# Patient Record
Sex: Male | Born: 1950 | Race: Black or African American | Hispanic: No | Marital: Single | Smoking: Former smoker
Health system: Southern US, Community
[De-identification: ages and names within clinical notes are randomized; demographics above are authoritative.]

## PROBLEM LIST (undated history)

## (undated) ENCOUNTER — Emergency Department (HOSPITAL_COMMUNITY): Admission: EM | Payer: BC Managed Care – PPO | Source: Home / Self Care

## (undated) DIAGNOSIS — F1010 Alcohol abuse, uncomplicated: Secondary | ICD-10-CM

## (undated) DIAGNOSIS — I10 Essential (primary) hypertension: Secondary | ICD-10-CM

## (undated) DIAGNOSIS — E785 Hyperlipidemia, unspecified: Secondary | ICD-10-CM

## (undated) HISTORY — DX: Hyperlipidemia, unspecified: E78.5

## (undated) HISTORY — DX: Alcohol abuse, uncomplicated: F10.10

## (undated) HISTORY — DX: Essential (primary) hypertension: I10

---

## 2002-08-06 ENCOUNTER — Encounter: Payer: Self-pay | Admitting: Emergency Medicine

## 2002-08-06 ENCOUNTER — Emergency Department (HOSPITAL_COMMUNITY): Admission: EM | Admit: 2002-08-06 | Discharge: 2002-08-06 | Payer: Self-pay | Admitting: Emergency Medicine

## 2005-10-21 ENCOUNTER — Ambulatory Visit: Payer: Self-pay | Admitting: Cardiology

## 2005-10-21 ENCOUNTER — Encounter: Payer: Self-pay | Admitting: Emergency Medicine

## 2005-10-21 ENCOUNTER — Inpatient Hospital Stay (HOSPITAL_COMMUNITY): Admission: EM | Admit: 2005-10-21 | Discharge: 2005-10-22 | Payer: Self-pay | Admitting: Emergency Medicine

## 2005-10-21 HISTORY — PX: OTHER SURGICAL HISTORY: SHX169

## 2005-10-27 ENCOUNTER — Ambulatory Visit: Payer: Self-pay | Admitting: Cardiology

## 2005-12-14 ENCOUNTER — Ambulatory Visit: Payer: Self-pay | Admitting: Cardiology

## 2006-07-01 ENCOUNTER — Emergency Department (HOSPITAL_COMMUNITY): Admission: EM | Admit: 2006-07-01 | Discharge: 2006-07-01 | Payer: Self-pay | Admitting: Emergency Medicine

## 2006-07-03 ENCOUNTER — Ambulatory Visit: Payer: Self-pay | Admitting: Cardiovascular Disease

## 2007-11-22 ENCOUNTER — Emergency Department (HOSPITAL_COMMUNITY): Admission: EM | Admit: 2007-11-22 | Discharge: 2007-11-22 | Payer: Self-pay | Admitting: Emergency Medicine

## 2008-01-19 ENCOUNTER — Emergency Department (HOSPITAL_COMMUNITY): Admission: EM | Admit: 2008-01-19 | Discharge: 2008-01-19 | Payer: Self-pay | Admitting: Emergency Medicine

## 2008-09-10 ENCOUNTER — Inpatient Hospital Stay (HOSPITAL_COMMUNITY): Admission: EM | Admit: 2008-09-10 | Discharge: 2008-09-13 | Payer: Self-pay | Admitting: Emergency Medicine

## 2008-09-10 ENCOUNTER — Ambulatory Visit: Payer: Self-pay | Admitting: Orthopedic Surgery

## 2008-11-26 DIAGNOSIS — I10 Essential (primary) hypertension: Secondary | ICD-10-CM | POA: Insufficient documentation

## 2008-11-26 DIAGNOSIS — F1010 Alcohol abuse, uncomplicated: Secondary | ICD-10-CM | POA: Insufficient documentation

## 2008-11-26 DIAGNOSIS — E785 Hyperlipidemia, unspecified: Secondary | ICD-10-CM | POA: Insufficient documentation

## 2008-11-27 ENCOUNTER — Ambulatory Visit: Payer: Self-pay | Admitting: Adult Health

## 2008-11-27 DIAGNOSIS — R42 Dizziness and giddiness: Secondary | ICD-10-CM | POA: Insufficient documentation

## 2009-04-01 ENCOUNTER — Telehealth (INDEPENDENT_AMBULATORY_CARE_PROVIDER_SITE_OTHER): Payer: Self-pay | Admitting: *Deleted

## 2009-06-20 ENCOUNTER — Emergency Department (HOSPITAL_COMMUNITY): Admission: EM | Admit: 2009-06-20 | Discharge: 2009-06-21 | Payer: Self-pay | Admitting: Emergency Medicine

## 2009-09-07 ENCOUNTER — Ambulatory Visit: Payer: Self-pay | Admitting: Cardiology

## 2009-09-07 DIAGNOSIS — I2510 Atherosclerotic heart disease of native coronary artery without angina pectoris: Secondary | ICD-10-CM | POA: Insufficient documentation

## 2009-11-29 ENCOUNTER — Emergency Department (HOSPITAL_COMMUNITY): Admission: EM | Admit: 2009-11-29 | Discharge: 2009-11-29 | Payer: Self-pay | Admitting: Emergency Medicine

## 2010-03-09 ENCOUNTER — Emergency Department (HOSPITAL_COMMUNITY)
Admission: EM | Admit: 2010-03-09 | Discharge: 2010-03-09 | Payer: Self-pay | Source: Home / Self Care | Admitting: Emergency Medicine

## 2010-03-22 NOTE — Assessment & Plan Note (Signed)
Summary: F2Y NEED MEDS CALLED IN/TMJ   Visit Type:  2 YR FU NEEDS MEDS REFILLED   History of Present Illness: 60 y/o AAM with known history of nonobstructive CAD per cardiac cath 2007, Hypertension, Hypercholesterolemia presents today for follow-up.  He has not been seen in the office for 2years.  He is requesting refill on his antihypertension medication.  The patient has been unemployed for over a year and did not follow up secondary to lack of insurance.  He does not have a primary care physician as well.  He has been complaining of dizziness and fatigue over the last month.  He ran out of his Norvasc 3 days ago and has actually been feeling better without it.  He did some work up in [place] over the summer and noticed his BP being elevated into the 180's-190's, but since then it has been better. He takes his BP at home and has noticed that his BP is in the 100-120's systolic.  He denies chest pain, SOB,fluid retention or H/A's.  He only takes a mutivitamin and norasc until he ran out 3 days ago.  Preventive Screening-Counseling & Management  Alcohol-Tobacco     Alcohol drinks/day: 2     Alcohol type: all     Alcohol Counseling: to STOP drinking     Smoking Status: no     Smoking Cessation Counseling: no  Problems Prior to Update: 1)  Alcohol Abuse  (ICD-305.00) 2)  Hyperlipidemia  (ICD-272.4) 3)  Hypertension  (ICD-401.9)  Medications Prior to Update: 1)  Amlodipine Besylate 5 Mg Tabs (Amlodipine Besylate) .... Take 1 Tablet By Mouth Once A Day 2)  Fish Oil 500 Mg Caps (Omega-3 Fatty Acids) .... Take 1 Cap Daily 3)  Daily Multi  Tabs (Multiple Vitamins-Minerals) .... Take 1 Tab Daily 4)  Vicodin 5-500 Mg Tabs (Hydrocodone-Acetaminophen) .... Take As Needed For Pain  Current Medications (verified): 1)  Amlodipine Besylate 5 Mg Tabs (Amlodipine Besylate) .... Hold 2)  Daily Multi  Tabs (Multiple Vitamins-Minerals) .... Take 1 Tab Daily 3)  Vicodin 5-500 Mg Tabs  (Hydrocodone-Acetaminophen) .... Take As Needed For Pain 4)  Aspir-Low 81 Mg Tbec (Aspirin) .... Take 1 Tab Daily  Allergies (verified): No Known Drug Allergies  Past History:  Risk Factors: Alcohol Use: 2 (11/27/2008) Exercise: no (11/26/2008)  Past medical, surgical, family and social histories (including risk factors) reviewed, and no changes noted (except as noted below).  Past Medical History: Reviewed history from 11/26/2008 and no changes required. Current Problems:  ALCOHOL ABUSE (ICD-305.00) HYPERLIPIDEMIA (ICD-272.4) HYPERTENSION (ICD-401.9)  Past Surgical History: Reviewed history from 11/26/2008 and no changes required. 2 caths in 10/21/2005  Family History: Reviewed history and no changes required. Family History of Hypertension:   Social History: Reviewed history from 11/26/2008 and no changes required. Disabled  Tobacco Use - No.  Alcohol Use - yes Regular Exercise - no Drug Use - no Alcohol drinks/day:  2 Smoking Status:  no  Review of Systems       All other systems have been reviewed and are negative unless stated above.   Vital Signs:  Patient profile:   60 year old male Height:      78 inches Weight:      247 pounds BMI:     28.65 Pulse rate:   83 / minute BP sitting:   127 / 70  (right arm)  Vitals Entered By: Dreama Saa, CNA (November 27, 2008 3:37 PM)  Physical Exam  General:  Well  developed, well nourished, in no acute distress. Head:  normocephalic and atraumatic Eyes:  PERRLA/EOM intact; conjunctiva and lids normal. Lungs:  Clear bilaterally to auscultation and percussion. Heart:  Non-displaced PMI, chest non-tender; regular rate and rhythm, S1, S2 without murmurs, rubs or gallops. Carotid upstroke normal, no bruit. Normal abdominal aortic size, no bruits. Femorals normal pulses, no bruits. Pedals normal pulses. No edema, no varicosities. Abdomen:  Bowel sounds positive; abdomen soft and non-tender without masses, organomegaly,  or hernias noted. No hepatosplenomegaly. Msk:  Back normal, normal gait. Muscle strength and tone normal. Pulses:  pulses normal in all 4 extremities Neurologic:  Alert and oriented x 3. Psych:  Normal affect.   Impression & Recommendations:  Problem # 1:  HYPERTENSION (ICD-401.9) I have taken the patient's BP manually in both arms and find it to be 120/74 on the R and 123/68 on the L.  He states he is feeling better without the use of the Norvasc.  At this time he will not need to be on antihypertensive medication.  He is to continue to take BP at home.  If systolic is persistantly greater than 140 he should call us and we will restart his Norvasc at 5 mg.  He is given a Rx to fill for Norvasc 5 mg. but he is to notify us that he has resumed this. His updated medication list for this problem includes:    Amlodipine Besylate 5 Mg Tabs (Amlodipine besylate) ..... Hold    Aspir-low 81 Mg Tbec (Aspirin) .Marland Kitchen... Take 1 tab daily  Problem # 2:  DIZZINESS (ICD-780.4) This may be related to his use of antihypertensive with normal BP.  Since running out of the medication, symptoms have subsided.  Problem # 3:  HYPERLIPIDEMIA (ICD-272.4) The patient has not had his cholesterol checked with known CAD. I have given him samples of crestor 5mg , and he is to follow up with the health dept for labs.  A referral sheet is completed for him so that he may be allowed to be followed at the health department here in [place].  Patient Instructions: 1)  Your physician recommends that you schedule a follow-up appointment in:  6 months 2)  Your physician has recommended you make the following change in your medication:hold amlodipine 3)  You have been referred to 4)  Your physician has requested that you regularly monitor and record your blood pressure readings at home.  Please use the same machine at the same time of day to check your readings and record them to bring to your follow-up visit.

## 2010-03-22 NOTE — Progress Notes (Signed)
Summary: RX REFILL  Phone Note Call from Patient Call back at Home Phone [phone]   Caller: PT Reason for Call: Refill Medication Summary of Call: [facility]OLOD[facility]INE 5MG  #[facility] [place] Initial call taken by: Faythe Ghee,  April 01, 2009 3:09 PM    New/Updated Medications: AMLODIPINE BESYLATE 5 MG TABS (AMLODIPINE BESYLATE) Take 1 tablet by mouth once a day Prescriptions: AMLODIPINE BESYLATE 5 MG TABS (AMLODIPINE BESYLATE) Take 1 tablet by mouth once a day  #30 x 3   Entered by:   Teressa Lower RN   Authorized by:   Kathlen Brunswick, MD, [facility]   Signed by:   Teressa Lower RN on 04/01/2009   Method used:   Electronically to        [facility] (retail)       [address]       [place], [place]  [address]       Ph: [phone]       Fax: [phone]   RxID:   [id number]   Appended Document: RX REFILL    Clinical Lists Changes  Medications: Rx of AMLODIPINE BESYLATE 5 MG TABS (AMLODIPINE BESYLATE) Take 1 tablet by mouth once a day;  #30 x 3;  Signed;  Entered by: Teressa Lower RN;  Authorized by: Kathlen Brunswick, MD, [facility];  Method used: [facility] to [facility] [phone]*, [address], [place], [place], [place]  [address], Ph: [phone] or [phone], Fax: [phone]    Prescriptions: AMLODIPINE BESYLATE 5 MG TABS (AMLODIPINE BESYLATE) Take 1 tablet by mouth once a day  #30 x 3   Entered by:   Teressa Lower RN   Authorized by:   Kathlen Brunswick, MD, [facility]   Signed by:   Teressa Lower RN on 04/13/2009   Method used:   [facility]ronically to        [facility] [phone]* (retail)       [address]       [place], [place]  [address]       Ph: [phone] or [phone]       Fax: [phone]   RxID:   [id number]

## 2010-03-22 NOTE — Assessment & Plan Note (Signed)
Summary: past due for 6 mth f/u per Lynn/tg   History of Present Illness: Nathaniel Kline returns today for the evaluation of problems listed in A and P.  He has atypical sharp pain intermittently under his left breast. It only lasts for seconds. He denies any angina.   He only drinks occasionally...much less than the past. He is due bloodwork.  Current Medications (verified): 1)  Amlodipine Besylate 5 Mg Tabs (Amlodipine Besylate) .... Take 1 Tablet By Mouth Once A Day 2)  Daily Multi  Tabs (Multiple Vitamins-Minerals) .... Take 1 Tab Daily 3)  Vicodin 5-500 Mg Tabs (Hydrocodone-Acetaminophen) .... Take As Needed For Pain 4)  Aspir-Low 81 Mg Tbec (Aspirin) .... Take 1 Tab Daily 5)  Crestor 5 Mg Tabs (Rosuvastatin Calcium) .Marland Kitchen.. 1 Po Daily  Allergies (verified): No Known Drug Allergies  Review of Systems       Negative other than HPI  Vital Signs:  Patient profile:   60 year old male Height:      78 inches Weight:      250 pounds BMI:     28.99 Pulse rate:   78 / minute Resp:     16 per minute BP sitting:   138 / 80  (left arm)  Vitals Entered By: Marrion Coy, CNA (September 07, 2009 2:24 PM)  Physical Exam  General:  Well developed, well nourished, in no acute distress. Head:  normocephalic and atraumatic Eyes:  PERRLA/EOM intact; conjunctiva and lids normal. Neck:  Neck supple, no JVD. No masses, thyromegaly or abnormal cervical nodes. Chest Kutler Vanvranken:  no deformities or breast masses noted Lungs:  Clear bilaterally to auscultation and percussion. Heart:  Non-displaced PMI, chest non-tender; regular rate and rhythm, S1, S2 without murmurs, rubs or gallops. Carotid upstroke normal, no bruit. Normal abdominal aortic size, no bruits. Femorals normal pulses, no bruits. Pedals normal pulses. No edema, no varicosities. Msk:  Back normal, normal gait. Muscle strength and tone normal. Pulses:  pulses normal in all 4 extremities Extremities:  No clubbing or cyanosis. Neurologic:  Alert and  oriented x 3. Skin:  Intact without lesions or rashes. Psych:  Normal affect.   Problems:  Medical Problems Added: 1)  Dx of Cad, Native Vessel  (ICD-414.01)  EKG  Procedure date:  09/07/2009  Findings:      NSR, low voltage  Impression & Recommendations:  Problem # 1:  HYPERTENSION (ICD-401.9) Assessment Improved Better on minimal meds. Decreased alcohol has probably helped. His updated medication list for this problem includes:    Amlodipine Besylate 5 Mg Tabs (Amlodipine besylate) .Marland Kitchen... Take 1 tablet by mouth once a day    Aspir-low 81 Mg Tbec (Aspirin) .Marland Kitchen... Take 1 tab daily  His updated medication list for this problem includes:    Amlodipine Besylate 5 Mg Tabs (Amlodipine besylate) .Marland Kitchen... Take 1 tablet by mouth once a day    Aspir-low 81 Mg Tbec (Aspirin) .Marland Kitchen... Take 1 tab daily  Future Orders: T-Comprehensive Metabolic Panel ([id number]) ... 09/08/2009  Problem # 2:  CAD, NATIVE VESSEL (ICD-414.01) Assessment: Unchanged  His updated medication list for this problem includes:    Amlodipine Besylate 5 Mg Tabs (Amlodipine besylate) .Marland Kitchen... Take 1 tablet by mouth once a day    Aspir-low 81 Mg Tbec (Aspirin) .Marland Kitchen... Take 1 tab daily  His updated medication list for this problem includes:    Amlodipine Besylate 5 Mg Tabs (Amlodipine besylate) .Marland Kitchen... Take 1 tablet by mouth once a day    Aspir-low 81 Mg Tbec (  Aspirin) .Marland Kitchen... Take 1 tab daily  Problem # 3:  ALCOHOL ABUSE (ICD-305.00) Assessment: Improved  Problem # 4:  HYPERLIPIDEMIA (ICD-272.4) Needs fasting lipids and CMP. Crestor samples given because of lack of funds. His updated medication list for this problem includes:    Crestor 5 Mg Tabs (Rosuvastatin calcium) .Marland Kitchen... 1 po daily  His updated medication list for this problem includes:    Crestor 5 Mg Tabs (Rosuvastatin calcium) .Marland Kitchen... 1 po daily  Future Orders: T-Lipid Profile ([id number]) ... 09/08/2009  Patient Instructions: 1)  Your physician recommends that  you schedule a follow-up appointment in: 1 year 2)  Your physician recommends that you return for a FASTING lipid profile: tomorrow 3)  Your physician recommends that you continue on your current medications as directed. Please refer to the Current Medication list given to you today.

## 2010-03-22 NOTE — Letter (Signed)
Summary: Hospital Consult RT elbow  Hospital Consult RT elbow   Imported By: Cammie Sickle 10/23/2008 18:40:26  _____________________________________________________________________  External Attachment:    Type:   Image     Comment:   External Document

## 2010-05-10 LAB — COMPREHENSIVE METABOLIC PANEL
ALT: 43 U/L (ref 0–53)
AST: 55 U/L — ABNORMAL HIGH (ref 0–37)
Albumin: 4.6 g/dL (ref 3.5–5.2)
Alkaline Phosphatase: 53 U/L (ref 39–117)
BUN: 15 mg/dL (ref 6–23)
CO2: 23 mEq/L (ref 19–32)
Calcium: 8.8 mg/dL (ref 8.4–10.5)
Chloride: 106 mEq/L (ref 96–112)
Creatinine, Ser: 1.31 mg/dL (ref 0.4–1.5)
GFR calc Af Amer: >60 mL/min (ref >60)
GFR calc non Af Amer: 56 mL/min — ABNORMAL LOW (ref >60)
Glucose, Bld: 112 mg/dL — ABNORMAL HIGH (ref 70–99)
Potassium: 3.6 mEq/L (ref 3.5–5.1)
Sodium: 140 mEq/L (ref 135–145)
Total Bilirubin: 0.5 mg/dL (ref 0.3–1.2)
Total Protein: 8.1 g/dL (ref 6.0–8.3)

## 2010-05-10 LAB — RAPID URINE DRUG SCREEN, HOSP PERFORMED
Amphetamines: NOT DETECTED
Barbiturates: NOT DETECTED
Benzodiazepines: NOT DETECTED
Cocaine: NOT DETECTED
Opiates: NOT DETECTED
Tetrahydrocannabinol: NOT DETECTED

## 2010-05-10 LAB — DIFFERENTIAL
Basophils Absolute: 0 10*3/uL (ref 0.0–0.1)
Basophils Relative: 1 % (ref 0–1)
Eosinophils Absolute: 0.1 10*3/uL (ref 0.0–0.7)
Eosinophils Relative: 2 % (ref 0–5)
Lymphocytes Relative: 46 % (ref 12–46)
Lymphs Abs: 3 10*3/uL (ref 0.7–4.0)
Monocytes Absolute: 0.5 10*3/uL (ref 0.1–1.0)
Monocytes Relative: 7 % (ref 3–12)
Neutro Abs: 2.8 10*3/uL (ref 1.7–7.7)
Neutrophils Relative %: 44 % (ref 43–77)

## 2010-05-10 LAB — AMMONIA: Ammonia: 20 umol/L (ref 11–35)

## 2010-05-10 LAB — CBC
HCT: 41.9 % (ref 39.0–52.0)
Hemoglobin: 14.5 g/dL (ref 13.0–17.0)
MCHC: 34.7 g/dL (ref 30.0–36.0)
MCV: 97.8 fL (ref 78.0–100.0)
Platelets: 226 10*3/uL (ref 150–400)
RBC: 4.28 MIL/uL (ref 4.22–5.81)
RDW: 13.6 % (ref 11.5–15.5)
WBC: 6.4 10*3/uL (ref 4.0–10.5)

## 2010-05-10 LAB — ETHANOL: Alcohol, Ethyl (B): 369 mg/dL — ABNORMAL HIGH (ref 0–10)

## 2010-05-29 LAB — FOLATE: Folate: 10.9 ng/mL

## 2010-05-29 LAB — DIFFERENTIAL
Basophils Absolute: 0 10*3/uL (ref 0.0–0.1)
Basophils Absolute: 0 10*3/uL (ref 0.0–0.1)
Basophils Absolute: 0.1 10*3/uL (ref 0.0–0.1)
Basophils Absolute: 0.1 10*3/uL (ref 0.0–0.1)
Basophils Relative: 0 % (ref 0–1)
Basophils Relative: 0 % (ref 0–1)
Basophils Relative: 0 % (ref 0–1)
Basophils Relative: 0 % (ref 0–1)
Eosinophils Absolute: 0 10*3/uL (ref 0.0–0.7)
Eosinophils Absolute: 0.1 10*3/uL (ref 0.0–0.7)
Eosinophils Absolute: 0.2 10*3/uL (ref 0.0–0.7)
Eosinophils Absolute: 0.2 10*3/uL (ref 0.0–0.7)
Eosinophils Relative: 0 % (ref 0–5)
Eosinophils Relative: 1 % (ref 0–5)
Eosinophils Relative: 2 % (ref 0–5)
Eosinophils Relative: 2 % (ref 0–5)
Lymphocytes Relative: 10 % — ABNORMAL LOW (ref 12–46)
Lymphocytes Relative: 22 % (ref 12–46)
Lymphocytes Relative: 25 % (ref 12–46)
Lymphocytes Relative: 29 % (ref 12–46)
Lymphs Abs: 2 10*3/uL (ref 0.7–4.0)
Lymphs Abs: 3.2 10*3/uL (ref 0.7–4.0)
Lymphs Abs: 3.4 10*3/uL (ref 0.7–4.0)
Lymphs Abs: 3.5 10*3/uL (ref 0.7–4.0)
Monocytes Absolute: 1 10*3/uL (ref 0.1–1.0)
Monocytes Absolute: 1.1 10*3/uL — ABNORMAL HIGH (ref 0.1–1.0)
Monocytes Absolute: 1.3 10*3/uL — ABNORMAL HIGH (ref 0.1–1.0)
Monocytes Absolute: 1.4 10*3/uL — ABNORMAL HIGH (ref 0.1–1.0)
Monocytes Relative: 10 % (ref 3–12)
Monocytes Relative: 10 % (ref 3–12)
Monocytes Relative: 7 % (ref 3–12)
Monocytes Relative: 7 % (ref 3–12)
Neutro Abs: 11.2 10*3/uL — ABNORMAL HIGH (ref 1.7–7.7)
Neutro Abs: 16.7 10*3/uL — ABNORMAL HIGH (ref 1.7–7.7)
Neutro Abs: 6.5 10*3/uL (ref 1.7–7.7)
Neutro Abs: 8.9 10*3/uL — ABNORMAL HIGH (ref 1.7–7.7)
Neutrophils Relative %: 59 % (ref 43–77)
Neutrophils Relative %: 64 % (ref 43–77)
Neutrophils Relative %: 70 % (ref 43–77)
Neutrophils Relative %: 83 % — ABNORMAL HIGH (ref 43–77)

## 2010-05-29 LAB — COMPREHENSIVE METABOLIC PANEL
ALT: 17 U/L (ref 0–53)
ALT: 25 U/L (ref 0–53)
AST: 19 U/L (ref 0–37)
AST: 26 U/L (ref 0–37)
Albumin: 2.7 g/dL — ABNORMAL LOW (ref 3.5–5.2)
Albumin: 3.5 g/dL (ref 3.5–5.2)
Alkaline Phosphatase: 44 U/L (ref 39–117)
Alkaline Phosphatase: 55 U/L (ref 39–117)
BUN: 14 mg/dL (ref 6–23)
BUN: 15 mg/dL (ref 6–23)
CO2: 27 mEq/L (ref 19–32)
CO2: 28 mEq/L (ref 19–32)
Calcium: 8.9 mg/dL (ref 8.4–10.5)
Calcium: 9.6 mg/dL (ref 8.4–10.5)
Chloride: 101 mEq/L (ref 96–112)
Chloride: 106 mEq/L (ref 96–112)
Creatinine, Ser: 1.2 mg/dL (ref 0.4–1.5)
Creatinine, Ser: 1.22 mg/dL (ref 0.4–1.5)
GFR calc Af Amer: >60 mL/min (ref >60)
GFR calc Af Amer: >60 mL/min (ref >60)
GFR calc non Af Amer: >60 mL/min (ref >60)
GFR calc non Af Amer: >60 mL/min (ref >60)
Glucose, Bld: 110 mg/dL — ABNORMAL HIGH (ref 70–99)
Glucose, Bld: 88 mg/dL (ref 70–99)
Potassium: 4.1 mEq/L (ref 3.5–5.1)
Potassium: 4.3 mEq/L (ref 3.5–5.1)
Sodium: 137 mEq/L (ref 135–145)
Sodium: 139 mEq/L (ref 135–145)
Total Bilirubin: 0.5 mg/dL (ref 0.3–1.2)
Total Bilirubin: 0.5 mg/dL (ref 0.3–1.2)
Total Protein: 6.2 g/dL (ref 6.0–8.3)
Total Protein: 7.4 g/dL (ref 6.0–8.3)

## 2010-05-29 LAB — C-REACTIVE PROTEIN: CRP: 2.6 mg/dL — ABNORMAL HIGH (ref <0.6)

## 2010-05-29 LAB — BASIC METABOLIC PANEL
BUN: 17 mg/dL (ref 6–23)
CO2: 27 mEq/L (ref 19–32)
Calcium: 9 mg/dL (ref 8.4–10.5)
Chloride: 105 mEq/L (ref 96–112)
Creatinine, Ser: 1.15 mg/dL (ref 0.4–1.5)
GFR calc Af Amer: >60 mL/min (ref >60)
GFR calc non Af Amer: >60 mL/min (ref >60)
Glucose, Bld: 102 mg/dL — ABNORMAL HIGH (ref 70–99)
Potassium: 3.6 mEq/L (ref 3.5–5.1)
Sodium: 138 mEq/L (ref 135–145)

## 2010-05-29 LAB — CBC
HCT: 35.9 % — ABNORMAL LOW (ref 39.0–52.0)
HCT: 37.3 % — ABNORMAL LOW (ref 39.0–52.0)
HCT: 37.3 % — ABNORMAL LOW (ref 39.0–52.0)
HCT: 40.9 % (ref 39.0–52.0)
Hemoglobin: 12.3 g/dL — ABNORMAL LOW (ref 13.0–17.0)
Hemoglobin: 12.8 g/dL — ABNORMAL LOW (ref 13.0–17.0)
Hemoglobin: 13 g/dL (ref 13.0–17.0)
Hemoglobin: 14 g/dL (ref 13.0–17.0)
MCHC: 34.2 g/dL (ref 30.0–36.0)
MCHC: 34.2 g/dL (ref 30.0–36.0)
MCHC: 34.3 g/dL (ref 30.0–36.0)
MCHC: 34.8 g/dL (ref 30.0–36.0)
MCV: 100.1 fL — ABNORMAL HIGH (ref 78.0–100.0)
MCV: 100.5 fL — ABNORMAL HIGH (ref 78.0–100.0)
MCV: 100.7 fL — ABNORMAL HIGH (ref 78.0–100.0)
MCV: 99.3 fL (ref 78.0–100.0)
Platelets: 268 10*3/uL (ref 150–400)
Platelets: 333 10*3/uL (ref 150–400)
Platelets: 354 10*3/uL (ref 150–400)
Platelets: 360 10*3/uL (ref 150–400)
RBC: 3.58 MIL/uL — ABNORMAL LOW (ref 4.22–5.81)
RBC: 3.71 MIL/uL — ABNORMAL LOW (ref 4.22–5.81)
RBC: 3.76 MIL/uL — ABNORMAL LOW (ref 4.22–5.81)
RBC: 4.06 MIL/uL — ABNORMAL LOW (ref 4.22–5.81)
RDW: 12.9 % (ref 11.5–15.5)
RDW: 13.1 % (ref 11.5–15.5)
RDW: 13.1 % (ref 11.5–15.5)
RDW: 13.3 % (ref 11.5–15.5)
WBC: 11 10*3/uL — ABNORMAL HIGH (ref 4.0–10.5)
WBC: 13.9 10*3/uL — ABNORMAL HIGH (ref 4.0–10.5)
WBC: 16.1 10*3/uL — ABNORMAL HIGH (ref 4.0–10.5)
WBC: 20.2 10*3/uL — ABNORMAL HIGH (ref 4.0–10.5)

## 2010-05-29 LAB — T4, FREE: Free T4: 1.09 ng/dL (ref 0.80–1.80)

## 2010-05-29 LAB — PROTIME-INR
INR: 1.1 (ref 0.00–1.49)
Prothrombin Time: 14.1 seconds (ref 11.6–15.2)

## 2010-05-29 LAB — SEDIMENTATION RATE: Sed Rate: 61 mm/hr — ABNORMAL HIGH (ref 0–16)

## 2010-05-29 LAB — VITAMIN B12: Vitamin B-12: 301 pg/mL (ref 211–911)

## 2010-05-29 LAB — TSH: TSH: 6.484 u[IU]/mL — ABNORMAL HIGH (ref 0.350–4.500)

## 2010-06-21 ENCOUNTER — Emergency Department (HOSPITAL_COMMUNITY)
Admission: EM | Admit: 2010-06-21 | Discharge: 2010-06-22 | Disposition: A | Discharge status: Home / Self Care | Payer: Self-pay | Attending: Emergency Medicine | Admitting: Emergency Medicine

## 2010-06-21 DIAGNOSIS — I10 Essential (primary) hypertension: Secondary | ICD-10-CM | POA: Insufficient documentation

## 2010-06-21 DIAGNOSIS — IMO0002 Reserved for concepts with insufficient information to code with codable children: Secondary | ICD-10-CM | POA: Insufficient documentation

## 2010-06-21 DIAGNOSIS — X58XXXA Exposure to other specified factors, initial encounter: Secondary | ICD-10-CM | POA: Insufficient documentation

## 2010-06-21 DIAGNOSIS — M76899 Other specified enthesopathies of unspecified lower limb, excluding foot: Secondary | ICD-10-CM | POA: Insufficient documentation

## 2010-06-21 DIAGNOSIS — M25569 Pain in unspecified knee: Secondary | ICD-10-CM | POA: Insufficient documentation

## 2010-06-21 DIAGNOSIS — Y9361 Activity, american tackle football: Secondary | ICD-10-CM | POA: Insufficient documentation

## 2010-06-21 DIAGNOSIS — E7800 Pure hypercholesterolemia, unspecified: Secondary | ICD-10-CM | POA: Insufficient documentation

## 2010-06-21 DIAGNOSIS — Y998 Other external cause status: Secondary | ICD-10-CM | POA: Insufficient documentation

## 2010-06-21 DIAGNOSIS — K7460 Unspecified cirrhosis of liver: Secondary | ICD-10-CM | POA: Insufficient documentation

## 2010-06-22 ENCOUNTER — Emergency Department (HOSPITAL_COMMUNITY): Payer: Self-pay

## 2010-07-05 NOTE — Consult Note (Signed)
NAMEHAKEEN, SHIPES                   ACCOUNT NO.:  [id number]   MEDICAL RECORD NO.:  [id number]          PATIENT TYPE:  INP   LOCATION:  A338                          FACILITY:  [facility]   PHYSICIAN:  Vickki Hearing, M.D.DATE OF BIRTH:  May 22, 1950   DATE OF CONSULTATION:  09/10/2008  DATE OF DISCHARGE:                                 CONSULTATION   CHIEF COMPLAINT:  Pain and swelling in the right elbow.   HISTORY:  A 60 year old male was injured several weeks ago and sustained  a blunt trauma to his right elbow and a second injury several weeks  later and then a laceration over the olecranon, this progressed to  swelling, tenderness, pain, redness which involved in his forearm,  wrist, and upper brachial area.  He presented to the hospital on 22nd  for evaluation.  He was started on IV antibiotics and Orthopedic consult  was obtained to rule out olecranon bursitis versus abscess.   Examination reveals a well-developed, well-nourished male, grooming, and  hygiene intact.  No developmental abnormalities.  Body habitus is  mesomorphic.  Cardiovascular findings normal pulse and perfusion to the  right upper extremity with some edema noted.   Skin showed a small puncture like wound over the olecranon with no  drainage, appears to have sealed over.  There is streaking proximally  and distally, primarily distally without mid forearm level in  approximately above mid brachial level.   Lymph nodes in the axilla and supraclavicular regions were normal.  Trochlear lymph nodes are negative.   The patient was awake, alert, and oriented x3.  Mood and affect were  normal.   Examination of the right elbow showed that there was some swelling from  the midforearm to the mid brachial area, primary areas of tenderness  were around the elbow and olecranon.  His range of motion was extension  20 degrees and flexion 110 degrees with pain throughout the range of  motion, but there is no rigidity or  resistance to flex the elbow.  Neurologic status showed normal sensation, normal reflexes, normal  orientation x3.   LAB RESULTS:  Basic metabolic panel normal.  CBC, 16 white count, 70%  neutrophils, and absolute neutrophils 11.2.   IMPRESSION:  Cellulitis without abscess.  Recommended continue  antibiotics, use heat to help with pain and symptoms.  I will continue  to follow.      Vickki Hearing, M.D.  Electronically Signed     SEH/MEDQ  D:  09/10/2008  T:  09/10/2008  Job:  [id number]

## 2010-07-05 NOTE — Consult Note (Signed)
NAMEHAILE, Kline                   ACCOUNT NO.:  [id number]   MEDICAL RECORD NO.:  [id number]          PATIENT TYPE:  INP   LOCATION:  A338                          FACILITY:  [facility]   PHYSICIAN:  Tilford Pillar, MD      DATE OF BIRTH:  1950/03/31   DATE OF CONSULTATION:  09/10/2008  DATE OF DISCHARGE:  09/13/2008                                 CONSULTATION   REASON FOR CONSULTATION:  Right elbow erythema and swelling.   HISTORY OF PRESENT ILLNESS:  The patient is a 60 year old male who  presented to [facility] with several days of increasing  discomfort and swelling of the right elbow and stated it also started to  increase in pain.  The patient is relatively healthy but apparently had  fallen several weeks ago.  Following that, he had retraumatized the  right elbow and developed a laceration.  Soon after this, he developed  erythema and edema.  He complains of his pain is somewhat better at this  time, although he still has significant erythema.  He denies any  paresthesias in his hand.  He has no weakness in his extremities.  He is  able to proceed through a full range of motion.   PAST MEDICAL HISTORY:  Hypertension, cirrhosis, hypercholesterolemia.   PAST SURGICAL HISTORY:  None.   MEDICATIONS:  Norvasc and aspirin.   ALLERGIES:  No known drug allergies.  Sensitivity to VANCOMYCIN is  suspected due to his reaction during the initial administration of  VANCOMYCIN on this admission.   SOCIAL HISTORY:  No tobacco.  Occasional alcohol.  No recreational drug  use.   PHYSICAL EXAMINATION:  VITAL SIGNS:  Stable with a temperature of 98  degrees, heart rate 80, respirations of 24, blood pressure 124/80, and  he is at 99% O2 saturation on room air.  GENERAL:  The patient is seated upright in his hospital bed.  He is not  in any distress.  He is alert and oriented x3.  HEENT:  Scalp:  No deformities.  Pupils are equal, round, and reactive.  Extraocular movements are  intact.  Oral mucosa is pink.  NECK:  Trachea is midline.  No cervical lymphadenopathy.  PULMONARY:  Unlabored.  He is clear to auscultation.  CARDIOVASCULAR:  Regular rate and rhythm.  ABDOMEN:  Soft, nontender.  EXTREMITIES:  On evaluation of this extremity, he does have some  erythema overlying the right elbow.  This is soft and edematous.  No  focal fluctuance is noted.  No crepitance is appreciated.  No bony  tenderness on palpation of the distal ulna and radius.  He has good  strength in the hand.  No diminished sensation compared to the left.   ASSESSMENT/PLAN:  Cellulitis of the right elbow.  At this time, the  patient has also been evaluated by Dr. Tiburcio Pea of Orthopedics.  There is  no evidence of any acute trauma to the upper extremity.  No fractures  are noted.  I do not  appreciate any fluctuance consistent with an abscess.  At this time,  there  is nothing to surgically drain.  We will continue treatment with  antibiotics and warm compresses to this area.  This was discussed with  the patient.  He understands, and at this time I will continue to follow  with the patient during this hospitalization.      Tilford Pillar, MD  Electronically Signed     BZ/MEDQ  D:  09/17/2008  T:  09/18/2008  Job:  [id number]

## 2010-07-05 NOTE — H&P (Signed)
Nathaniel Kline, Nathaniel Kline                   ACCOUNT NO.:  [id number]   MEDICAL RECORD NO.:  [id number]          Kline TYPE:  INP   LOCATION:  A338                          FACILITY:  [facility]   PHYSICIAN:  Margaretmary Dys, M.D.DATE OF BIRTH:  09/18/1950   DATE OF ADMISSION:  09/09/2008  DATE OF DISCHARGE:  LH                              HISTORY & PHYSICAL   PRIMARY CARE PHYSICIAN:  Nathaniel Kline is unassigned.   ADMISSION DIAGNOSES:  1. Cellulitis of Nathaniel right forearm and elbow.  2. Abscess right elbow.  3. Indeterminate age fracture of Nathaniel right ulnar bone.  4. History of fall about 4 days ago which was an accidental fall.   CHIEF COMPLAINT:  Pain in his right elbow.   HISTORY OF PRESENT ILLNESS:  Nathaniel Kline is a 60 year old male who  presented to Nathaniel emergency room with complaints of pain and swelling in  his right elbow.  Nathaniel Kline reports having fallen accidentally about 4  days ago on his porch.  Nathaniel Kline was apparently taking out some trash and it  was raining and Nathaniel Kline slipped and fell.  Nathaniel Kline said Nathaniel Kline landed with  his elbow and on his forearm.  Nathaniel Kline had some severe swelling in his elbow  but felt that it was going to go away.  Nathaniel Kline said Nathaniel pain has  progressively increased, also Nathaniel swelling has increased to Nathaniel point  where his right elbow is now swollen and was discharging some yellowish  fluid.  Nathaniel Kline has had some fevers and chills but denies any rigors.  Nathaniel Kline had  no nausea or vomiting.  Nathaniel Kline said that Nathaniel Kline has not had any  decreased function or movement of his right arm.  Nathaniel Kline describes a  shooting pain going through his elbow to his forearm.   Nathaniel Kline was evaluated in Nathaniel emergency room and was found to have a  cellulitis of his right elbow.  Nathaniel Kline was also found to have a fracture of  Nathaniel ulnar bone on his x-ray.  Nathaniel age of Nathaniel fracture was indeterminate  as per Radiologist's report.  Nathaniel Kline is now being admitted for  further evaluation and management.  Nathaniel Kline  denies any chest pain.   REVIEW OF SYSTEMS:  Otherwise negative except as mentioned in history of  present illness.   PAST MEDICAL HISTORY:  1. Hypertension.  2. Liver cirrhosis.  3. Hypercholesterolemia.  4. History of alcohol abuse.   MEDICATIONS:  1. Norvasc 5 mg p.o. once a day.  2. Aspirin 81 mg p.o. once a day.   ALLERGIES:  Nathaniel Kline has no known drug allergies but may have been  reacting to vancomycin as Nathaniel Kline was apparently developing a rash  and was having some hives when Nathaniel Kline was being infused with vancomycin.   SOCIAL HISTORY:  Nathaniel Kline is single, has two children.  Nathaniel Kline denies any  smoking.  Nathaniel Kline drinks occasionally.  Denies any illicit drug use.  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   FAMILY HISTORY:  Positive for hypertension, diabetes, coronary artery  disease in family members and actually his family members are well-known  to Nathaniel hospitalist service for multiple admissions in Nathaniel past.   PHYSICAL EXAMINATION:  Nathaniel Kline was conscious and alert, comfortable,  not in acute distress.  Was well oriented in time, place and person.  VITAL SIGNS: His blood pressure was 123/85 with a pulse of 83,  respirations were 20, temperature 97.5 degrees Fahrenheit, oxygen  saturation was 98% on room air.  HEENT EXAM:  Normocephalic, atraumatic.  Oral mucosa was dry.  No  exudates were noted.  NECK:  Supple.  No JVD or lymphadenopathy.  LUNGS:  Clear clinically with good air entry bilaterally.  HEART: S1-S2 regular.  No accessory gallops or rubs.  ABDOMEN:  Soft, nontender.  Bowel sounds positive.  No masses palpable.  EXTREMITIES:  Nathaniel Kline has a swollen right elbow with an area of  punctum seen with no active drainage at this time.  There was  surrounding erythema and induration in that right elbow consistent with  cellulitis.  Nathaniel Kline also had some induration and erythema extending  to Nathaniel mid upper arm and also Nathaniel proximal forearm.  Nathaniel Kline  had  full range of movement in his wrist and also his fingers without any  significant pain or difficulty.  CNS EXAM:  Grossly intact with no focal neurological deficits.   LABORATORY AND DIAGNOSTIC DATA:  White blood count 16.1, hemoglobin of  13, hematocrit 37.3, platelet count was 268.  Sodium was 138, potassium  3.6, chloride of 105, CO2 was 27, glucose 102, BUN 17, creatinine was  1.15, calcium was 9.0.  X-rays of Nathaniel right forearm showed an age  indeterminate fracture of Nathaniel distal ulna.  X-ray of Nathaniel elbow showed  soft tissue swelling with no acute osseous abnormality.   ASSESSMENT:  Nathaniel Kline is a 60 year old male who had an accidental fall  at home about 4 days ago.  Nathaniel Kline landed on his elbow and his  forearm.  Nathaniel Kline now presents with swelling and abscess cellulitis  of his right elbow and x-rays suggest an indeterminate fracture of his  right forearm.   PLAN:  1. Nathaniel Kline will be admitted to Nathaniel medical floor.  2. Will initiate IV antibiotics with Zosyn and request pharmacy to      dose.  3. Will keep right arm elevated.  4. Will request for orthopedic surgical consult with Dr. Romeo Apple to      further evaluate his right forearm.  Nathaniel Kline may require CT      scan or MRI to really confirm if Nathaniel Kline does have a fracture or not.  5. Will also request Dr. Leticia Penna, surgeon, to evaluate his right elbow      for potential incision and drainage of Nathaniel abscess.  6. Will resume his other home medications.   I have indicated Nathaniel above to Nathaniel Kline Nathaniel possibility of incision  and drainage, Nathaniel possibility of additional workup by orthopedic surgery  to evaluate his arm.  Nathaniel Kline verbalized full understanding.  Nathaniel Kline  denies any possibility of substance abuse during his fall.   Code status.  Nathaniel Kline is full code.  Thank you.  Cultures have been  sent for his right elbow swelling that it is draining.  There is concern  that this may MRSA infection and Nathaniel Kline  may require parenteral  vancomycin.   However, Nathaniel Kline apparently may have reacted to it.  I am not sure  what  Nathaniel rate of infusion was.  If this does come back as MRSA and Nathaniel Kline  has extensive infection, Nathaniel Kline may require Zyvox therapy both  intravenously and possibly subsequently orally as an outpatient.      Margaretmary Dys, M.D.  Electronically Signed     AM/MEDQ  D:  09/10/2008  T:  09/10/2008  Job:  [id number]

## 2010-07-05 NOTE — Discharge Summary (Signed)
NAMECHEY, CHO                   ACCOUNT NO.:  [id number]   MEDICAL RECORD NO.:  [id number]          PATIENT TYPE:  INP   LOCATION:  A338                          FACILITY:  [facility]   PHYSICIAN:  Renee Ramus, MD       DATE OF BIRTH:  November 15, 1950   DATE OF ADMISSION:  09/09/2008  DATE OF DISCHARGE:  LH                               DISCHARGE SUMMARY   PRIMARY DISCHARGE DIAGNOSIS:  Uncomplicated cellulitis.   SECONDARY DIAGNOSES:  1. Alcohol abuse.  2. Hypertension.  3. History of hyperlipidemia.   HOSPITAL COURSE BY PROBLEM:  1. Cellulitis.  The patient is a 60 year old male who was admitted      secondary to increasing pain and swelling in his right elbow.  The      patient was seen in the emergency department.  He was admitted to      our service.  The patient was placed on broad-spectrum antibiotics.      He has had a good clinical response, the swelling is decreased, the      redness and erythema has decreased.  The patient will be placed on      ciprofloxacin at discharge for approximately 14 days.  He is to      follow up with the general surgeon who investigated his arm for      possible abscess if needed.  2. Hypertension.  This has been relatively well controlled on Norvasc,      and he will continue this post discharge.  3. Hypercholesterolemia.  The patient will continue his prehospital      regimen.  4. Alcohol abuse.  The patient does not have signs and symptoms of      active cirrhosis.  He does have macrocytosis with MCV of      approximately 101.  He has been counseled in respect to alcohol      use, but he has a normal INR, he does not have elevated liver      enzymes and does not have thrombocytopenia.  The patient has no      outward appearances of cirrhosis.  He has no evidence of edema or      other electrolyte abnormalities consistent with that.  He has no      evidence of anasarca.  The patient says that he will greatly      decrease the amount that he  drinks.   LABORATORIES OF NOTE:  1. Initial leukocytosis with white count of 20.2 decreasing to 11.0.  2. Macrocytosis with MCV of 100.7.  3. ESR of 61.  4. Mild hypoalbuminemia with an albumin of 2.7.   STUDIES:  1. Plain film of the right forearm showing age indeterminate fracture      of the distal ulna.  He did not have clinical correlation to      suggest this was acute.  2. Right elbow film showing no acute osseous abnormality but just      evidence of soft tissue swelling.  3. Plain film of the right humerus showing no proximal  humeral injury.   MEDICATIONS ON DISCHARGE:  1. Norvasc 5 mg p.o. daily.  2. Aspirin which we are asking him to discontinue.  3. Fish oil 500 mg p.o. daily.  4. Multivitamin for men p.o. daily.  5. Ciprofloxacin 500 mg p.o. b.i.d. x14 days.  6. Vicodin 5/325 one to two p.o. q.4h. p.r.n. pain.   There are no labs or studies pending at the time of discharge.   The patient is in stable condition and anxious for discharge.   Time spent 35 minutes.      Renee Ramus, MD  Electronically Signed     JF/MEDQ  D:  09/13/2008  T:  09/13/2008  Job:  [id number]

## 2010-07-05 NOTE — Group Therapy Note (Signed)
Nathaniel Kline, Nathaniel Kline                   ACCOUNT NO.:  [id number]   MEDICAL RECORD NO.:  [id number]          PATIENT TYPE:  INP   LOCATION:  A338                          FACILITY:  [facility]   PHYSICIAN:  Melissa L. Ladona Ridgel, MD  DATE OF BIRTH:  [birth year]   DATE OF PROCEDURE:  09/11/2008  DATE OF DISCHARGE:                                 PROGRESS NOTE   Please note that the patient has remained afebrile.  He denies any  significant pain in the arm today.  He states that he has increased  range of motion.  He denies nausea or vomiting.  He is eating well.  He  did speak with Dr. Romeo Apple earlier today who recommends continuing  antibiotic therapy for now.   PHYSICAL EXAMINATION:  VITAL SIGNS:  T-max was 97.5 with blood pressure  123/85, pulse 83, respirations 20 and saturation 98%.  GENERAL:  This is a moderately obese African American male in no acute  distress.  EXTREMITIES:  Review of the elbow reveals tenderness at the olecranon  bursa with swelling extending into the biceps, triceps and radioulnar  area but improved since yesterday.  He is able to bend the arm.  I do  not appreciate any purulent material.  PERRL, EOMI, anicteric, nose without lesions or discharge. Mouth with no  lesions. Neck is supple  Chest: Clear to ausculatation, no RRRW  CVS: RRR +S1,S2 no S3 or S4  Abd : mild obsese , non tender, non distended.   The patient was seen by Dr. Romeo Apple earlier today.  He feels at this  time that treating the pain and continuing antibiotics will be  necessary.  No excision is required at this time.  He makes no comment  with regard to the x-ray finding which basically said there was no  fracture of the elbow.  However, the ulnar area had a transverse  impaction fracture, age indeterminate.   ASSESSMENT AND PLAN:  This is a 60 year old gentleman who was admitted  this morning with increasing swelling after the injury several weeks  ago, currently being treated with Zosyn.  He will  be followed for any  other interventions, especially I and D.  1. Cellulitis and abscess, perhaps bursitis.  Continue Zosyn and      medication for pain control.  2. Indeterminate fracture of the right ulna.  No comment from Dr.      Romeo Apple.  I will double check with him to make sure that he is      aware of that.   Please note this is a followup to an early morning admit for this  patient.      Melissa L. Ladona Ridgel, MD  Electronically Signed    MLT/MEDQ  D:  09/11/2008  T:  09/11/2008  Job:  [id number]

## 2010-07-05 NOTE — Group Therapy Note (Signed)
NAMECRISTAN, Nathaniel Kline                   ACCOUNT NO.:  [id number]   MEDICAL RECORD NO.:  [id number]          PATIENT TYPE:  INP   LOCATION:  A338                          FACILITY:  [facility]   PHYSICIAN:  Melissa L. Ladona Ridgel, MD  DATE OF BIRTH:  10-31-1950   DATE OF PROCEDURE:  09/11/2008  DATE OF DISCHARGE:                                 PROGRESS NOTE   SUBJECTIVE:  The patient is doing quite well.  He states he has  increased range of motion in the right elbow.  He denies nausea,  vomiting or fever.  He has had no diarrhea but his bowels have moved.   OBJECTIVE:  VITAL SIGNS:  T-max was 98.9, temperature this morning 98.6,  blood pressure 149/88, pulse 71, respirations 20, saturation 97%.  GENERAL:  This is a well-developed, well-nourished [place] American male  who is in no acute distress.  HEENT:  He is normocephalic, atraumatic.  Pupils equal, round and  reactive to light.  Extraocular muscles are intact.  Mucous membranes  are moist.  NECK:  Neck is supple.  There is no JVD.  No lymph nodes.  No carotid  bruits.  CHEST:  Clear to auscultation.  There is no rhonchi, rales or wheezes.  CARDIOVASCULAR:  Regular rate and rhythm.  Positive S1, S2.  No S3, S4.  No murmurs, rubs or gallops.  ABDOMEN:  Soft, nontender, nondistended with positive bowel sounds.  There is no hepatosplenomegaly, no guarding, no rebound.  EXTREMITIES:  Show continued cellulitic changes over the right elbow  including the bursa and then extending edema and erythema to the forearm  and to the biceps/triceps area.  However, mostly the edema is improving  significantly.  I see no purulent material draining from the wound.  Lower extremities show no edema, 2+ pulses in the radial area.  NEUROLOGICAL:  Cranial nerves II-XII are intact.  Power is 5/5.  DTRs  2+.  Plantars are downgoing.   PERTINENT LABORATORIES:  His white count has increased to 20.2 from 16  on admission.  His hemoglobin 14, his hematocrit is 40.9 and  his  platelets are 354.  His sodium is 137, potassium 4.3, chloride 101, CO2  28, BUN 50, creatinine is 1.22.  His glucose is 110.  Liver enzymes are  within normal limits.   ASSESSMENT AND PLAN:  1. This is a 60 year old African American male with a cellulitis      involving the right elbow with bursitis.  He remains on Zosyn and      pain medication.  If his white count does not respond favorably in      the morning I would recommend adding vancomycin and/or considering      draining the bursa.  2. Indeterminate fracture of the right ulna.  I did speak with Dr.      Romeo Apple and he reviewed the film with me and      thought that this perhaps was an over read especially since the      patient does not have a significant pain in that area.  Total time on this case was 25 minutes.      Melissa L. Ladona Ridgel, MD  Electronically Signed     MLT/MEDQ  D:  09/12/2008  T:  09/12/2008  Job:  [id number]

## 2010-07-05 NOTE — Assessment & Plan Note (Signed)
[facility]                       [place] [facility] OFFICE NOTE   NAME:Deer, REFORD OLLIFF                          MRN:          [id number]  DATE:07/03/2006                            DOB:          07/07/1950    Mr. Ned is seen today at the request of the [facility] ER.  He was seen  on May 11 at 12:05 for chest pain.  The patient has a history of heart  catheterization back in September of 2007, and at the time he only had  40% diagonal disease and 30% distal right disease.  He had some  dyssynchrony of the distal anterior wall and apex.  It was thought that  he had no significant coronary artery disease.  He needed followup for  his LV function.  He even had IVUS of the distal LAD, without a critical  lesion.   The patient's coronary risk factors include family history of coronary  disease, hypertension and hyperlipidemia.   The patient awoke on Sunday and while at church had significant  diaphoresis.  He never had chest pain.  He felt somewhat weak and  jittery.  He said he felt tense.  Despite not having any chest pain, he  took one nitroglycerin.  This did not make him feel any better.  He  subsequently went to the bathroom and continued to have shakiness and  sweating, so he went to the emergency room.  In the emergency room, his  blood sugar was fine.  He ruled out for myocardial infarction.  His  monitor was fine, and he was essentially discharged without a diagnosis.   Over the last 48 hours, he has been fine.  He has not had any recurrent  episodes.  There has been no chest pain, palpitations, PND or orthopnea.  The patient does drink alcohol a bit excessively.  This is a known  problem.  He drinks both beer and vodka.  He admits to having 3 to 4  beers and 2 to 3 shots of vodka when he drinks.  He says he drinks maybe  3 to 4 times per week.   He denies having had a significant alcohol intake prior to his sweating  at church.   As far  as I know, there is no chronic liver disease.   REVIEW OF SYSTEMS:  His review of systems is otherwise negative.   FAMILY HISTORY:  Positive for premature coronary artery disease on his  father's side.  Patient works as a Nature conservation officer at [employer].  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  He spent 25 years or so  in [place].  He is not married but has 3 older children, ages 40-  22.  He has 2 sisters.  The children and his sisters actually have been  on him about his drinking.   The patient otherwise fairly sedentary, except at work, but he does like  to walk.   MEDICATIONS:  Include:  1. Norvasc 5 a day.  2. An aspirin a day.  3. Simvastatin 40 a day.  ALLERGIES:  HE DENIES ANY ALLERGIES.   PHYSICAL EXAMINATION:  GENERAL:  His exam is remarkable for a thin 6  feet 6, healthy-appearing black male in no distress.  VITAL SIGNS:  His weight is 230, blood pressure is 120/70, pulse 72 and  regular, respiratory rate 14.  HEENT:  Normal.  NECK:  Carotids normal without bruits.  There is no lymphadenopathy, no  JVP elevation.  No thyromegaly.  LUNGS:  Clear with normal diaphragmatic motion.  There is no S1, S2,  normal heart sounds.  PMI is normal.  ABDOMEN:  Benign.  There is no hepatosplenomegaly, no epigastric pain.  No hepatojugular reflux.  There is no AAA, no renal bruits.  Bowel  sounds are positive.  There is no tenderness.  EXTREMITIES:  Femorals are +3 bilaterally without bruits.  Distal pulses  are intact with no edema.  NEURO:  Nonfocal.  MUSCULOSKELETAL:  Normal without weakness.   His baseline EKG shows sinus rhythm with an incomplete right bundle  branch block.   IMPRESSION:  Episode of primarily diaphoresis of unknown etiology.  He  was not hypoglycemic.  His medications have not been changed, so this is  unlikely to be an anginal equivalent.  His cath approximately 8 to 9  months ago had very limited disease.   I told Waqas that, so long as  his symptoms do not recur, we would see him  back in a year for a follow-up stress Myoview.   He will continue risk factor modification with Norvasc for his blood  pressure and Simvastatin for hypercholesterolemia.  His LFTs were normal  in the ER evaluation.   We had a frank discussion about his alcohol intake.  He says he has  tried to cut back in the past.  This is an issue that he has worked on  with his sisters and his family.   He has not had any inpatient stays to date.   He understands the chronic risk of liver disease with continued alcohol  use.   Overall, I think his cardiac status is stable, and I will see him back  in September for a stress test.     Theron Arista C. Eden Emms, MD, [facility]     PCN/MedQ  DD: 07/03/2006  DT: 07/03/2006  Job #: [id number]

## 2010-07-08 NOTE — Assessment & Plan Note (Signed)
[facility]                         [place] CARDIOLOGY OFFICE NOTE   NAME:[name], [name]                          MRN:          [id number]  DATE:10/27/2005                            DOB:          1950-12-27    Nathaniel Kline returns today for further management of coronary artery disease and  hypertension as well as hyperlipidemia.   He is a 60 year old gentleman who presented to [facility] Emergency  room on September 1, with chest discomfort consistent with an acute coronary  syndrome.  His EKG showed some ST segment changes anterolaterally.  His risk  factors are hypertension with unknown cholesterol status.   He was transported to [facility] emergently and underwent urgent  catheterization.  He had a mild anterior wall motion abnormality with EF 55-  60%.  He had a 40% by IVUS in the LAD as well as a takeoff of diagonal with  30% in the right coronary artery.   His enzymes were surprisingly negative.  His LFTs were mildly elevated in  the 40s.  He has a history of drinking about a case of beer a week and some  Vodka.   Since discharge, he has still had some intermittent shortness of breath, but  no chest pain.  He is on Norvasc 5 mg a day and aspirin 81 mg a day.  He was  told to hold his simvastatin until we checked his LFTs today.  He says he  has been without any alcohol for a week.   PHYSICAL EXAMINATION:  VITAL SIGNS:  Blood pressure 120/68 in the right arm,  130/68 in the left arm, pulse 75 and regular, weight 233, height 6 feet 6  inches.  NECK:  Carotids are equal bilaterally without bruits.  No JVD, thyroid not  enlarged.  Trachea is midline.  LUNGS:  Clear.  HEART:  Regular rate and rhythm.  ABDOMEN:  Soft, good bowel sounds.  EXTREMITIES:  His right groin site is stable from the catheterization.  No  edema.  Pulses are present.   EKG shows poor R-wave progression across the anterior precordium which is  consistent with  his previous findings.  He has ST-segment changes in I and  aVL which are a little bit more prominent as well as in V5-V6.   ASSESSMENT:  1. 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  His wall motion abnormalities suggested myocardial stunning,      although his enzymes did not increase.  Some of this could be related      to alcohol as well.  2. Hyperlipidemia.  3. Hypertension, now under good control.   PLAN:  1. Check LFTs today.  If they are normal, we will start simvastatin 40 mg      nightly with followup blood work in 6 weeks.  2. Note written for him to return to light duty at [facility] on Tuesday,      October 31, 2005.  3. I will see him back again in 6 weeks.  Thomas C. Daleen Squibb, MD, [facility]   TCW/MedQ  DD:  10/27/2005  DT:  10/28/2005  Job #:  [id number]

## 2010-07-08 NOTE — Procedures (Signed)
Nathaniel Kline, Nathaniel Kline                   ACCOUNT NO.:  [id number]   MEDICAL RECORD NO.:  [id number]          PATIENT TYPE:  EMS   LOCATION:  ED                            FACILITY:  [facility]   PHYSICIAN:  Edward L. Juanetta Gosling, M.D.DATE OF BIRTH:  [birth year]   DATE OF PROCEDURE:  10/21/2005  DATE OF DISCHARGE:                                EKG INTERPRETATION   DESCRIPTION OF PROCEDURE:  October 21, 2005, 10:43.   INTERPRETATION:  The rhythm is sinus rhythm with rate in the 90's.  There is  a PVC or a fusion beat; there is only 1.  There is left atrial enlargement.  His electrocardiogram shows a pattern of chronic pulmonary disease and  clinical correlation is suggested.   IMPRESSION:  Abnormal electrocardiogram.      Edward L. Juanetta Gosling, M.D.  Electronically Signed     ELH/MEDQ  D:  10/22/2005  T:  10/23/2005  Job:  [id number]

## 2010-07-08 NOTE — Assessment & Plan Note (Signed)
[facility]                         [place] [facility] OFFICE NOTE   NAME:Nathaniel Kline, Nathaniel Kline                          MRN:          [id number]  DATE:12/14/2005                            DOB:          [birth year]    Mr. Dacanay returns today for followup of the following issues:  1. 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  2. Hyperlipidemia.  3. Hypertension.  4. History of significant alcohol usage.   He is doing remarkably well.  He is walking three to four times a week.  He  is watching his diet.  He is taking the simvastatin we started on last visit  at 40 mg a day.  He has cut down on his drinking tremendously.   His only complaint is getting dizzy when he bends over frequently at [facility].  He would like to avoid this in the future.   His medications are unchanged other than the addition of simvastatin 40 mg a  day.  He is still carrying sublingual nitroglycerin.   His blood pressure today is 136/80, his pulse is 74 and regular, his weight  is 238.  He looks remarkably healthy today.  Neck shows no JVD, carotids are  full, there are no bruits.  Lungs are clear.  Heart reveals a regular rate  and rhythm; no gallop, rub or murmur. Abdominal exam is soft.  Extremities  reveal no edema.  Pulses are present.   ASSESSMENT AND PLAN:  Mr. Vavra is doing well.  We have asked him to continue  with his current medical program.  I have written a note to his employer for  him to avoid heavy lifting or frequent bending.  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   We have also arranged for him to have a comprehensive metabolic panel and  fasting lipids.  We will call him with the results.   I will plan on seeing him back in March 2008, six months out from his acute  event.     Thomas C. Daleen Squibb, MD, [employer]    TCW/MedQ  DD: 12/14/2005  DT: 12/15/2005  Job #: [id number]

## 2010-07-08 NOTE — Discharge Summary (Signed)
Nathaniel Kline, Nathaniel Kline                   ACCOUNT NO.:  [id number]   MEDICAL RECORD NO.:  [id number]          PATIENT TYPE:  INP   LOCATION:  2922                         FACILITY:  [facility]   PHYSICIAN:  Jesse Sans. Wall, MD, FACCDATE OF BIRTH:  Jan 29, 1951   DATE OF ADMISSION:  10/21/2005  DATE OF DISCHARGE:  10/22/2005                                 DISCHARGE SUMMARY   PRIMARY CARDIOLOGIST:  Dr. Juanito Doom.   DISCHARGING DIAGNOSES:  1. Chest pain with abnormal EKG, showing mild ST doming in inferolateral      leads.      a.     Negative cardiac enzymes.      b.     Status post cardiac catheterization on October 21, 2005 by Dr.       Gala Romney.  Results, moderate nonobstructive coronary artery disease       with a questionable hazy lesion in the mid left anterior descending       artery.      c.     Normal left ventricular function with distal anterior wall       dyssynchrony.      d.     Status post intravascular ultrasound of the mid left anterior       descending by Tonny Bollman, showing moderate atherosclerotic       concentric plaquing involving the mid left anterior descending.  No       thrombo fissure line.  2. Hypertension.  3. ETOH abuse.  4. Hyperlipidemia.  5. Elevated liver function tests.  AST 47, ALT 53.  6. Patient status post a CT of the abdomen showing a few liver and renal      cysts, otherwise, negative.  7. Ongoing shortness of breath.  Negative D-dimer.  CT of the scan      negative for pulmonary embolism.  8. Remote history of crack cocaine.   HOSPITAL COURSE:  [name] is a 60 year old African American gentleman with  no known history of coronary artery disease, who presented initially to  [facility] complaining of staggering chest pain.  EKG showed mild  ST doming inferolateral leads.  Given his sudden symptoms, he was brought to  [facility] and taken urgently to the cardiac catheterization lab.  Results  of cardiac catheterization as stated above.   The patient tolerated the  procedure without complications.  Continued to complain of shortness of  breath.  D-dimer and CT scan both within normal limits.  Cardiac enzymes  negative.  Patient found to have elevated LFTs in the setting of ongoing  ETOH use, hypertension controlled with p.o. medications.   Patient discharged home with instructions to follow up with Dr. Daleen Squibb in  [place], given phone number [phone] to call and make an appointment  within the next 2 weeks as the patient was discharged home on a Saturday.  He was given post cardiac catheterization discharge instructions with  instructions to continue a low-fat, low-sodium diet.  May return to work  after seen by Dr. Daleen Squibb.  Patient also educated on the importance  of  discontinuing or decreasing his ETOH use.  I gave him a prescription for  nitroglycerin sublingual p.r.n.   DISCHARGING MEDICATIONS:  Include:  1. Norvasc 5 mg daily.  2. I instructed him to hold his Zocor until his liver was rechecked.  3. Aspirin 81 mg daily to be continued.   FOLLOWUP APPOINTMENT:  With Dr. Daleen Squibb.  Patient will need to have liver  enzymes rechecked.   DURATION OF DISCHARGE ENCOUNTER:  Thirty minutes.     ______________________________  Dorian Pod, ACNP      Jesse Sans. Daleen Squibb, MD, [facility]  Electronically Signed    MB/MEDQ  D:  11/13/2005  T:  11/13/2005  Job:  [id number]

## 2010-07-08 NOTE — H&P (Signed)
[name], [name]                   ACCOUNT NO.:  [id number]   MEDICAL RECORD NO.:  [id number]          PATIENT TYPE:  EMS   LOCATION:  ED                            FACILITY:  [facility]   PHYSICIAN:  Thomas C. Wall, MD, FACCDATE OF BIRTH:  Oct 19, 1950   DATE OF ADMISSION:  10/21/2005  DATE OF DISCHARGE:                                HISTORY & PHYSICAL   Nathaniel Kline is a 60 year old African American gentleman with no known history  of coronary artery disease, who apparently experienced some mild substernal  chest discomfort yesterday with physical exertion.  The patient did not  think much of the symptoms and apparently resolved.  Today he was at his  mother's house, helping her move some boxes and experienced the chest  discomfort again.  Apparently it was much more intense.  He rates it an 8 on  a scale of 1 to 10.  The patient was transported to [facility] for  further evaluation.  EKG changes noted there with ST elevations in V3-V4.  Dr. Juanito Doom was called at [facility] for transport for further  evaluation.  The patient was transported by [facility] to [facility].  We had the patient taken immediately to the cath lab, where Dr. Gala Romney is  in the process of doing his cardiac catheterization.  Lab work at [facility] showing a BUN of 9, creatinine 1.1, potassium 3.4.  The patient  received, at [facility], aspirin, nitroglycerin sublingual, a 5,000  unit heparin bolus, and 5 mg of Lopressor with an additional second dose.  Extremely hypertensive there with a BP of 156/101 initially.   PAST MEDICAL HISTORY:  1. Hypertension.  2. Hyperlipidemia.   ALLERGIES:  NO KNOWN DRUG ALLERGIES.   No current medications.   SOCIAL HISTORY:  He lives in [place].  He is employed by [employer].   FAMILY HISTORY:  Unknown at this time.  I do know that his mother is alive  and well.  He denies any tobacco, ETOH, drug, or herbal medication use.   REVIEW OF  SYSTEMS:  Positive for chest discomfort,   PHYSICAL EXAMINATION:  VITAL SIGNS:  The patient's heart rate currently is  90, respirations 20, last documented blood pressure at [facility]  was 133/92 with a heart rate of 73, respirations of 20.  The patient was  afebrile.  GENERAL:  Currently, the patient is still complaining of some left arm  discomfort.  He is negative for chest pain at this time.  HEENT:  Normocephalic atraumatic.  Pupils are equal, round, and reactive to  light.  Sclerae is clear.  NECK:  Supple without lymphadenopathy.  Negative bruit.  Negative JVD.  CHEST:  Clear to auscultation bilaterally.  CARDIOVASCULAR:  S1 and S2.  Regular rate and rhythm.  ABDOMEN:  Soft, nontender.  Positive bowel sounds.  LOWER EXTREMITIES:  Without clubbing, cyanosis.  He has palpable pulses  bilaterally.  NEUROLOGIC:  The patient is alert and oriented x3.  Cranial nerves II-XII  grossly intact.   Dr. Elijah Birk  Wall in to examine and assess the patient.  The patient is being  taken urgently to the cardiac catheterization lab for further evaluation by  Dr. Nicholes Mango, will proceed with cardiac catheterization.  The risks and  benefits have been discussed with the patient.  The patient agrees to  proceed.     ______________________________  Nathaniel Kline, ACNP      Nathaniel Kline. Daleen Squibb, MD, [facility]  Electronically Signed    MB/MEDQ  D:  10/21/2005  T:  10/21/2005  Job:  [id number]

## 2010-07-08 NOTE — Cardiovascular Report (Signed)
Kline Kline                   ACCOUNT NO.:  [id number]   MEDICAL RECORD NO.:  [id number]          PATIENT TYPE:  INP   LOCATION:  2922                         FACILITY:  [facility]   PHYSICIAN:  Veverly Fells. Excell Seltzer, MD  DATE OF BIRTH:  1950-07-18   DATE OF PROCEDURE:  10/21/2005  DATE OF DISCHARGE:  10/22/2005                              CARDIAC CATHETERIZATION   PROCEDURE PERFORMED:  Intravascular ultrasound of the mid left anterior  descending.   CARDIOLOGIST:  Veverly Fells. Excell Seltzer, M.D.   INDICATIONS:  Kline Kline is a 60 year old gentleman who presented with chest  pain and anterior  ST elevation.  He was brought emergently for cardiac  catheterization performed by Dr. Arvilla Meres.  The catheterization  demonstrated a normal right coronary artery and normal left circumflex.  There was an area in the mid LAD at a branching point where the second  diagonal originates that was somewhat hazy.  There appeared to be  angiographically approximately a 40% stenosis there.  The left  ventriculogram demonstrated mild hypokinesis of the distal anterior wall.  With the patient's chest pain and EKG findings as well as the findings on  the angiogram we performed intravascular ultrasound to better characterize  this segment of the mid LAD.   PROCEDURAL DETAILS:  A 6 French arterial sheath was in place from the  diagnostic procedure.  A 6 French CLS-3.5 mm guiding catheter was inserted.  A BMW coronary guidewire was used to cross the lesion, which was done  without difficulty.  One-hundred micrograms of intracoronary nitroglycerin  was given, intravenous heparin was given and an ACT was performed, which was  236; and, an additional 1,000 units of heparin was given.  The IVUS was then  performed.   At the conclusion of the case the patient's right common femoral artery was  sealed with a 6 [place] Angio-Seal.   FINDINGS:  The distal LAD where we began pullback was normal.  There was  mild  eccentric plaque distal to the area of interest.  As we approached the  area of interest there was concentric plaquing with very mild calcification  present; there was actually a large atheroma burden.  However, the luminal  area remained nonobstructed throughout.  The lumen at the tightest area of  stenosis had an area of 6.4 square millimeters.  The proximal LAD had very  mild concentric plaquing and no other disease was noted.   ASSESSMENT:  Moderate atherosclerotic concentric plaquing involving the mid  left anterior descending.  There was no thrombus visualized.   PLAN:  Medical therapy with aggressive risk factor modification.      Veverly Fells. Excell Seltzer, MD  Electronically Signed     MDC/MEDQ  D:  10/21/2005  T:  10/23/2005  Job:  [id number]

## 2010-07-08 NOTE — Cardiovascular Report (Signed)
NAMETADEUSZ, STAHL                   ACCOUNT NO.:  [id number]   MEDICAL RECORD NO.:  [id number]          PATIENT TYPE:  INP   LOCATION:  2922                         FACILITY:  [facility]   PHYSICIAN:  Bevelyn Buckles. Bensimhon, MDDATE OF BIRTH:  1950-05-25   DATE OF PROCEDURE:  10/21/2005  DATE OF DISCHARGE:  10/22/2005                              CARDIAC CATHETERIZATION   PATIENT IDENTIFICATION:  Mr. Wanat is a 60 year old male with no known  history of coronary artery disease.  He does have history of hypertension.  He presented with about 24-hour history of stuttering chest pain.  EKG  showed some mild ST doming in anterolateral leads.  Given his stuttering  symptoms, he was brought to the catheterization lab for emergent  angiography.   PROCEDURES PERFORMED:  1. Selective coronary angiography.  2. Left heart cath.  3. Left ventriculogram.   DESCRIPTION OF PROCEDURE:  The risks and benefits of the catheterization  were explained.  Consent was signed and placed on the chart.  A 6-French  arterial sheath was placed in right femoral artery using a modified  Seldinger technique.  All standard catheters including JL-4, JR-4 and angled  pigtail were used.  All catheter exchanges made over wire.  There were no  apparent complications.   Central aortic pressure 136/96 with a mean of 115.  LV pressure is 129/80  with LVEDP of 11.  There was no aortic stenosis.   Left main was normal.   LAD was a long vessel wrapping the apex.  It gave off a large branching  first diagonal and a small second diagonal.  There was a 40% lesion in the  mid-LAD spanning the takeoff of the first diagonal.  This was mildly hazy in  one view.  There was also 40% lesion in the proximal portion of the  diagonal.   Left circumflex was a moderate-size vessel made up of a large OM1 and a  small OM2.  It was angiographically normal.   The right coronary artery was a very large dominant vessel.  It gave off a  RV branch,  large branching PDA and two normal-size posterolaterals.   Left ventriculogram done in the RAO position showed an EF of 55% with distal  anterior wall dyssynchrony.  There was no mitral regurgitation.  Left  ventriculogram done in the LAO position showed an EF of 55-60% with normal  septal and lateral wall motion.   ASSESSMENT:  1. Moderate nonobstructive coronary artery disease with a questionable      hazy lesion in the mid-left anterior descending artery.  2. Normal left ventricular function with distal anterior wall dyssynchrony      on the RAO left ventriculogram.  The LAO ventriculogram was normal.  3. Abnormal EKG with stuttering chest pain suggestive of possible acute      coronary syndrome.   PLAN/DISCUSSION:  The lesion in his LAD does not look obstructive but given  his symptoms and abnormal ECG, I do think it is reasonable to proceed with  IVUS of the LAD to evaluate for thrombus or underlying significant  filling  defect.  This will be done by Dr. Excell Seltzer.  He will need aggressive risk  factor modification as well as control of his blood pressure.      Bevelyn Buckles. Bensimhon, MD  Electronically Signed     DRB/MEDQ  D:  10/21/2005  T:  10/23/2005  Job:  [id number]

## 2010-09-20 ENCOUNTER — Encounter: Payer: Self-pay | Admitting: Cardiology

## 2010-09-29 ENCOUNTER — Encounter: Payer: Self-pay | Admitting: Cardiology

## 2010-09-29 ENCOUNTER — Ambulatory Visit (INDEPENDENT_AMBULATORY_CARE_PROVIDER_SITE_OTHER): Payer: Self-pay | Admitting: Cardiology

## 2010-09-29 VITALS — BP 145/83 | HR 103 | Ht 78.0 in | Wt 242.0 lb

## 2010-09-29 DIAGNOSIS — F1110 Opioid abuse, uncomplicated: Secondary | ICD-10-CM

## 2010-09-29 DIAGNOSIS — I10 Essential (primary) hypertension: Secondary | ICD-10-CM

## 2010-09-29 DIAGNOSIS — E785 Hyperlipidemia, unspecified: Secondary | ICD-10-CM

## 2010-09-29 DIAGNOSIS — I2510 Atherosclerotic heart disease of native coronary artery without angina pectoris: Secondary | ICD-10-CM

## 2010-09-29 MED ORDER — CARVEDILOL 6.25 MG PO TABS: 6.2500 mg | ORAL_TABLET | Freq: Two times a day (BID) | ORAL | Status: DC

## 2010-09-29 MED ORDER — ATORVASTATIN CALCIUM 20 MG PO TABS: 20.0000 mg | ORAL_TABLET | Freq: Every day | ORAL | Status: DC

## 2010-09-29 NOTE — Patient Instructions (Signed)
**Note De-Identified  Obfuscation** Your physician has recommended you make the following change in your medication: start taking Carvedilol 6.25 mg twice daily and Atorvastatin 20 mg at bedtime  Your physician has requested that you have an echocardiogram. Echocardiography is a painless test that uses sound waves to create images of your heart. It provides your doctor with information about the size and shape of your heart and how well your heart's chambers and valves are working. This procedure takes approximately one hour. There are no restrictions for this procedure.  Your physician recommends that you schedule a follow-up appointment in: October 10, 2010

## 2010-09-29 NOTE — Assessment & Plan Note (Signed)
Stable. No change in medical program. 

## 2010-09-29 NOTE — Progress Notes (Signed)
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  Continues to drink about a pint of vodka at least several days a week. He smokes when he drinks. He isn't having some wheezing particularly in the morning. He is very minor reflux symptoms. He sometimes eats late at night including sweets.  Blood pressures up to date. He denies any angina or chest pain. He's really had no true orthopnea or edema.  He stopped taking Crestor because he can't afford it. We will place him on generic.  EKG shows normal sinus rhythm heart rate of 89 beats per minute, question of right ventricular hypertrophy, poor R-wave progression inter-precordium with an RSR prime in V1 and V2. Past Medical History  Diagnosis Date  . Alcohol abuse   . HLD (hyperlipidemia)   . HTN (hypertension)     Past Surgical History  Procedure Date  . 2 caths 10/21/05    Family History  Problem Relation Age of Onset  . Hypertension      family hx    History   Social History  . Marital Status: Single    Spouse Name: N/A    Number of Children: N/A  . Years of Education: N/A   Occupational History  . Not on file.   Social History Main Topics  . Smoking status: Current Some Day Smoker  . Smokeless tobacco: Never Used   Comment: tobacco use - no  . Alcohol Use: Yes  . Drug Use: No  . Sexually Active: Not on file   Other Topics Concern  . Not on file   Social History Narrative   Disabled, does not get regular exercise.     No Known Allergies  Current Outpatient Prescriptions  Medication Sig Dispense Refill  . amLODipine (NORVASC) 5 MG tablet Take 5 mg by mouth daily.        Marland Kitchen aspirin 81 MG EC tablet Take 81 mg by mouth daily.        . Multiple Vitamin (MULTIVITAMIN) tablet Take 1 tablet by mouth daily.        Marland Kitchen atorvastatin (LIPITOR)  20 MG tablet Take 1 tablet (20 mg total) by mouth at bedtime.  30 tablet  3  . carvedilol (COREG) 6.25 MG tablet Take 1 tablet (6.25 mg total) by mouth 2 (two) times daily.  60 tablet  3    ROS Negative other than HPI.   PE General Appearance: well developed, well nourished in no acute distress, vague man in strong HEENT: symmetrical face, PERRLA, good dentition  Neck: no JVD, thyromegaly, or adenopathy, trachea midline Chest: symmetric without deformity Cardiac: PMI non-displaced, RRR, normal S1, S2, no gallop or murmur Lung: clear to ausculation and percussion Vascular: all pulses full without bruits  Abdominal: nondistended, nontender, good bowel sounds, no HSM, no bruits Extremities: no cyanosis, clubbing or edema, no sign of DVT, no varicosities  Skin: normal color, no rashes Neuro: alert and oriented x 3, non-focal Pysch: normal affect Filed Vitals:   09/29/10 1131  BP: 145/83  Pulse: 103  Height: 6\' 6"  (1.981 m)  Weight: 242 lb (109.77 kg)  SpO2: 93%    EKG  Labs and Studies Reviewed.   Lab Results  Component Value Date   WBC 6.4 06/20/2009   HGB 14.5 06/20/2009   HCT 41.9 06/20/2009   MCV 97.8 06/20/2009   PLT 226 06/20/2009  Chemistry      Component Value Date/Time   NA 140 06/20/2009 2311   K 3.6 06/20/2009 2311   CL 106 06/20/2009 2311   CO2 23 06/20/2009 2311   BUN 15 06/20/2009 2311   CREATININE 1.31 06/20/2009 2311      Component Value Date/Time   CALCIUM 8.8 06/20/2009 2311   ALKPHOS 53 06/20/2009 2311   AST 55* 06/20/2009 2311   ALT 43 06/20/2009 2311   BILITOT 0.5 06/20/2009 2311       No results found for this basename: CHOL   No results found for this basename: HDL   No results found for this basename: LDLCALC   No results found for this basename: TRIG   No results found for this basename: CHOLHDL   No results found for this basename: HGBA1C   Lab Results  Component Value Date   ALT 43 06/20/2009   AST 55* 06/20/2009   ALKPHOS 53 06/20/2009   BILITOT  0.5 06/20/2009

## 2010-09-29 NOTE — Assessment & Plan Note (Signed)
Not well controlled. A lot of this is due to his lifestyle. Reinforced decreasing alcohol or cutting out altogether. Stop smoking also reinforced. Also added carvedilol 6.25 mg twice a day. We'll check echocardiogram for the effect of hypertension not to mention the alcohol. Patient aware of this. We'll see back in a week and a half for blood pressure check and going over the importance of following her regimen as well as the echocardiogram results.

## 2010-09-29 NOTE — Assessment & Plan Note (Signed)
Prescribed atorvastatin 20 mg q.h.s. With all blood work in 6 weeks.

## 2010-10-03 ENCOUNTER — Ambulatory Visit (HOSPITAL_COMMUNITY): Payer: Self-pay

## 2010-10-04 ENCOUNTER — Ambulatory Visit (HOSPITAL_COMMUNITY): Payer: Self-pay

## 2010-10-10 ENCOUNTER — Ambulatory Visit: Payer: Self-pay | Admitting: Cardiology

## 2010-10-10 ENCOUNTER — Encounter: Payer: Self-pay | Admitting: Cardiology

## 2010-10-18 ENCOUNTER — Ambulatory Visit (HOSPITAL_COMMUNITY)
Admission: RE | Admit: 2010-10-18 | Discharge: 2010-10-18 | Disposition: A | Discharge status: Home / Self Care | Payer: Self-pay | Source: Ambulatory Visit | Attending: Cardiology | Admitting: Cardiology

## 2010-10-18 DIAGNOSIS — E785 Hyperlipidemia, unspecified: Secondary | ICD-10-CM | POA: Insufficient documentation

## 2010-10-18 DIAGNOSIS — R0789 Other chest pain: Secondary | ICD-10-CM | POA: Insufficient documentation

## 2010-10-18 DIAGNOSIS — R42 Dizziness and giddiness: Secondary | ICD-10-CM | POA: Insufficient documentation

## 2010-10-18 DIAGNOSIS — I10 Essential (primary) hypertension: Secondary | ICD-10-CM | POA: Insufficient documentation

## 2010-10-18 DIAGNOSIS — I517 Cardiomegaly: Secondary | ICD-10-CM

## 2010-10-18 NOTE — Progress Notes (Signed)
*  PRELIMINARY RESULTS* Echocardiogram 2D Echocardiogram has been performed.  Nathaniel Kline 10/18/2010, 12:51 PM

## 2010-10-19 ENCOUNTER — Encounter: Payer: Self-pay | Admitting: Adult Health

## 2010-10-20 ENCOUNTER — Ambulatory Visit: Payer: Self-pay | Admitting: Adult Health

## 2010-11-03 ENCOUNTER — Ambulatory Visit: Payer: Self-pay | Admitting: Adult Health

## 2010-11-22 LAB — ETHANOL: Alcohol, Ethyl (B): 499

## 2010-11-22 LAB — URINALYSIS, ROUTINE W REFLEX MICROSCOPIC
Bilirubin Urine: NEGATIVE
Glucose, UA: NEGATIVE
Hgb urine dipstick: NEGATIVE
Ketones, ur: NEGATIVE
Nitrite: NEGATIVE
Protein, ur: NEGATIVE
Specific Gravity, Urine: <1.005 — ABNORMAL LOW
Urobilinogen, UA: 0.2
pH: 5.5

## 2010-11-22 LAB — GLUCOSE, CAPILLARY: Glucose-Capillary: 130 — ABNORMAL HIGH

## 2010-11-22 LAB — BASIC METABOLIC PANEL
BUN: 16
CO2: 21
Calcium: 8.9
Chloride: 108
Creatinine, Ser: 1.21
GFR calc Af Amer: >60
GFR calc non Af Amer: >60
Glucose, Bld: 116 — ABNORMAL HIGH
Potassium: 3.6
Sodium: 137

## 2010-11-22 LAB — CBC
HCT: 45.4
Hemoglobin: 15.2
MCHC: 33.5
MCV: 97.4
Platelets: 246
RBC: 4.67
RDW: 12.7
WBC: 8.2

## 2010-11-22 LAB — DIFFERENTIAL
Basophils Absolute: 0
Basophils Relative: 0
Eosinophils Absolute: 0.1
Eosinophils Relative: 1
Lymphocytes Relative: 47 — ABNORMAL HIGH
Lymphs Abs: 3.8
Monocytes Absolute: 0.5
Monocytes Relative: 6
Neutro Abs: 3.8
Neutrophils Relative %: 46

## 2010-11-22 LAB — RAPID URINE DRUG SCREEN, HOSP PERFORMED
Amphetamines: NOT DETECTED
Barbiturates: NOT DETECTED
Benzodiazepines: NOT DETECTED
Cocaine: NOT DETECTED
Opiates: NOT DETECTED
Tetrahydrocannabinol: NOT DETECTED

## 2010-12-21 ENCOUNTER — Other Ambulatory Visit: Payer: Self-pay

## 2010-12-21 ENCOUNTER — Telehealth: Payer: Self-pay | Admitting: Cardiology

## 2010-12-21 MED ORDER — AMLODIPINE BESYLATE 5 MG PO TABS: 5.0000 mg | ORAL_TABLET | Freq: Every day | ORAL | Status: DC

## 2010-12-21 NOTE — Telephone Encounter (Signed)
Patient needs Amlodipine called in to [facility] in [place]

## 2011-02-03 ENCOUNTER — Other Ambulatory Visit: Payer: Self-pay | Admitting: *Deleted

## 2011-02-03 MED ORDER — AMLODIPINE BESYLATE 5 MG PO TABS: 5.0000 mg | ORAL_TABLET | Freq: Every day | ORAL | Status: DC

## 2011-10-02 ENCOUNTER — Other Ambulatory Visit: Payer: Self-pay | Admitting: Cardiology

## 2011-10-02 MED ORDER — AMLODIPINE BESYLATE 5 MG PO TABS: 5.0000 mg | ORAL_TABLET | Freq: Every day | ORAL | Status: DC

## 2011-10-26 ENCOUNTER — Other Ambulatory Visit: Payer: Self-pay | Admitting: Cardiology

## 2012-01-23 ENCOUNTER — Other Ambulatory Visit: Payer: Self-pay | Admitting: *Deleted

## 2012-01-23 MED ORDER — AMLODIPINE BESYLATE 5 MG PO TABS: 5.0000 mg | ORAL_TABLET | Freq: Every day | ORAL | Status: DC

## 2012-03-07 ENCOUNTER — Other Ambulatory Visit: Payer: Self-pay | Admitting: Cardiology

## 2012-04-15 ENCOUNTER — Other Ambulatory Visit: Payer: Self-pay | Admitting: Cardiology

## 2012-05-22 ENCOUNTER — Other Ambulatory Visit: Payer: Self-pay | Admitting: Cardiology

## 2012-05-23 ENCOUNTER — Other Ambulatory Visit: Payer: Self-pay | Admitting: Cardiology

## 2012-05-24 NOTE — Telephone Encounter (Signed)
Message left for patient, that he would be given 30 tablets only and would need to call office for follow up appt, as he has not been seen since 2012, otherwise he would need to obtain further refills from his PCP, if he did not intend on returning to our office/TMyers, RN

## 2012-06-11 ENCOUNTER — Ambulatory Visit: Payer: Self-pay | Admitting: Adult Health

## 2012-06-17 ENCOUNTER — Encounter: Payer: Self-pay | Admitting: Adult Health

## 2012-06-17 ENCOUNTER — Ambulatory Visit (INDEPENDENT_AMBULATORY_CARE_PROVIDER_SITE_OTHER): Payer: Self-pay | Admitting: Adult Health

## 2012-06-17 VITALS — BP 152/86 | HR 68 | Ht 78.0 in | Wt 268.0 lb

## 2012-06-17 DIAGNOSIS — I2510 Atherosclerotic heart disease of native coronary artery without angina pectoris: Secondary | ICD-10-CM

## 2012-06-17 DIAGNOSIS — E7800 Pure hypercholesterolemia, unspecified: Secondary | ICD-10-CM

## 2012-06-17 DIAGNOSIS — E785 Hyperlipidemia, unspecified: Secondary | ICD-10-CM

## 2012-06-17 DIAGNOSIS — I10 Essential (primary) hypertension: Secondary | ICD-10-CM

## 2012-06-17 MED ORDER — AMLODIPINE BESYLATE 10 MG PO TABS: 10.0000 mg | ORAL_TABLET | Freq: Every day | ORAL | Status: DC

## 2012-06-17 NOTE — Progress Notes (Deleted)
Name: Nathaniel Kline    DOB: 1950/07/03  Age: 62 y.o.  MR#: [id number]       PCP:  No primary provider on file.      Insurance: Payor:  No coverage found.   CC:    Chief Complaint  Patient presents with  . Coronary Artery Disease  . Hypertension    VS Filed Vitals:   06/17/12 1427  BP: 152/86  Pulse: 68  Height: 6\' 6"  (1.981 m)  Weight: 268 lb (121.564 kg)    Weights Current Weight  06/17/12 268 lb (121.564 kg)  09/29/10 242 lb (109.77 kg)  09/07/09 250 lb (113.399 kg)    Blood Pressure  BP Readings from Last 3 Encounters:  06/17/12 152/86  09/29/10 145/83  09/07/09 138/80     Admit date:  (Not on file) Last encounter with RMR:  06/11/2012   Allergy Review of patient's allergies indicates no known allergies.  Current Outpatient Prescriptions  Medication Sig Dispense Refill  . amLODipine (NORVASC) 5 MG tablet TAKE 1 TABLET EVERY DAY  30 tablet  0  . aspirin 81 MG EC tablet Take 81 mg by mouth daily.        . Multiple Vitamin (MULTIVITAMIN) tablet Take 1 tablet by mouth daily.         No current facility-administered medications for this visit.    Discontinued Meds:    Medications Discontinued During This Encounter  Medication Reason  . atorvastatin (LIPITOR) 20 MG tablet Error  . carvedilol (COREG) 6.25 MG tablet Error    Patient Active Problem List   Diagnosis Date Noted  . CAD, NATIVE VESSEL 09/07/2009  . DIZZINESS 11/27/2008  . HYPERLIPIDEMIA 11/26/2008  . ALCOHOL ABUSE 11/26/2008  . HYPERTENSION 11/26/2008    LABS    Component Value Date/Time   NA 140 06/20/2009 2311   NA 139 09/13/2008 0340   NA 137 09/11/2008 0555   K 3.6 06/20/2009 2311   K 4.1 09/13/2008 0340   K 4.3 09/11/2008 0555   CL 106 06/20/2009 2311   CL 106 09/13/2008 0340   CL 101 09/11/2008 0555   CO2 23 06/20/2009 2311   CO2 27 09/13/2008 0340   CO2 28 09/11/2008 0555   GLUCOSE 112* 06/20/2009 2311   GLUCOSE 88 09/13/2008 0340   GLUCOSE 110* 09/11/2008 0555   BUN 15 06/20/2009 2311   BUN 14  09/13/2008 0340   BUN 15 09/11/2008 0555   CREATININE 1.31 06/20/2009 2311   CREATININE 1.20 09/13/2008 0340   CREATININE 1.22 09/11/2008 0555   CALCIUM 8.8 06/20/2009 2311   CALCIUM 8.9 09/13/2008 0340   CALCIUM 9.6 09/11/2008 0555   GFRNONAA 56* 06/20/2009 2311   GFRNONAA >60 09/13/2008 0340   GFRNONAA >60 09/11/2008 0555   GFRAA  Value: >60        The eGFR has been calculated using the MDRD equation. This calculation has not been validated in all clinical situations. eGFR's persistently <60 mL/min signify possible Chronic Kidney Disease. 06/20/2009 2311   GFRAA  Value: >60        The eGFR has been calculated using the MDRD equation. This calculation has not been validated in all clinical situations. eGFR's persistently <60 mL/min signify possible Chronic Kidney Disease. 09/13/2008 0340   GFRAA  Value: >60        The eGFR has been calculated using the MDRD equation. This calculation has not been validated in all clinical situations. eGFR's persistently <60 mL/min signify possible Chronic Kidney Disease. 09/11/2008  0555   CMP     Component Value Date/Time   NA 140 06/20/2009 2311   K 3.6 06/20/2009 2311   CL 106 06/20/2009 2311   CO2 23 06/20/2009 2311   GLUCOSE 112* 06/20/2009 2311   BUN 15 06/20/2009 2311   CREATININE 1.31 06/20/2009 2311   CALCIUM 8.8 06/20/2009 2311   PROT 8.1 06/20/2009 2311   ALBUMIN 4.6 06/20/2009 2311   AST 55* 06/20/2009 2311   ALT 43 06/20/2009 2311   ALKPHOS 53 06/20/2009 2311   BILITOT 0.5 06/20/2009 2311   GFRNONAA 56* 06/20/2009 2311   GFRAA  Value: >60        The eGFR has been calculated using the MDRD equation. This calculation has not been validated in all clinical situations. eGFR's persistently <60 mL/min signify possible Chronic Kidney Disease. 06/20/2009 2311       Component Value Date/Time   WBC 6.4 06/20/2009 2311   WBC 11.0* 09/13/2008 0340   WBC 13.9* 09/12/2008 0335   HGB 14.5 06/20/2009 2311   HGB 12.8* 09/13/2008 0340   HGB 12.3* 09/12/2008 0335   HCT 41.9 06/20/2009 2311   HCT 37.3*  09/13/2008 0340   HCT 35.9* 09/12/2008 0335   MCV 97.8 06/20/2009 2311   MCV 100.5* 09/13/2008 0340   MCV 100.1* 09/12/2008 0335    Lipid Panel  No results found for this basename: chol, trig, hdl, cholhdl, vldl, ldlcalc    ABG No results found for this basename: phart, pco2, pco2art, po2, po2art, hco3, tco2, acidbasedef, o2sat     Lab Results  Component Value Date   TSH 6.484 ***Test methodology is 3rd generation TSH**** 09/13/2008   BNP (last 3 results) No results found for this basename: PROBNP,  in the last 8760 hours Cardiac Panel (last 3 results) No results found for this basename: CKTOTAL, CKMB, TROPONINI, RELINDX,  in the last 72 hours  Iron/TIBC/Ferritin No results found for this basename: iron, tibc, ferritin     EKG Orders placed in visit on 06/17/12  . EKG 12-LEAD     Prior Assessment and Plan Problem List as of 06/17/2012     ICD-9-CM   HYPERLIPIDEMIA   Last Assessment & Plan   09/29/2010 Office Visit Written 09/29/2010 12:48 PM by Gaylord Shih, MD     Prescribed atorvastatin 20 mg q.h.s. With all blood work in 6 weeks.    ALCOHOL ABUSE   HYPERTENSION   Last Assessment & Plan   09/29/2010 Office Visit Written 09/29/2010 12:47 PM by Gaylord Shih, MD     Not well controlled. A lot of this is due to his lifestyle. Reinforced decreasing alcohol or cutting out altogether. Stop smoking also reinforced. Also added carvedilol 6.25 mg twice a day. We'll check echocardiogram for the effect of hypertension not to mention the alcohol. Patient aware of this. We'll see back in a week and a half for blood pressure check and going over the importance of following her regimen as well as the echocardiogram results.    CAD, NATIVE VESSEL   Last Assessment & Plan   09/29/2010 Office Visit Written 09/29/2010 12:46 PM by Gaylord Shih, MD     Stable. No change in medical program.    DIZZINESS       Imaging: No results found.

## 2012-06-17 NOTE — Progress Notes (Signed)
   HPI [name] is a 62 year old patient of Dr. 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 He was last seen by Dr. Daleen Squibb in August of 2012 echocardiogram completed in 2012 revealed an EF of 55-60% with no wall motion abnormalities. He was found to have some concentric hypertrophy. Blood pressure was elevated on last office visit, and carvedilol 6.25 mg was added twice a day. He was to followup for blood pressure evaluation it did not return.   He is doing well currently without any complaints of chest pain shortness of breath or dizziness, he has not been hospitalized, and seen in ER, her been given any new diagnoses. He is medically compliant.  No Known Allergies  Current Outpatient Prescriptions  Medication Sig Dispense Refill  . aspirin 81 MG EC tablet Take 81 mg by mouth daily.        . Multiple Vitamin (MULTIVITAMIN) tablet Take 1 tablet by mouth daily.        Marland Kitchen amLODipine (NORVASC) 10 MG tablet Take 1 tablet (10 mg total) by mouth daily.  90 tablet  1   No current facility-administered medications for this visit.    Past Medical History  Diagnosis Date  . Alcohol abuse   . HLD (hyperlipidemia)   . HTN (hypertension)     Past Surgical History  Procedure Laterality Date  . 2 caths  10/21/05    ZOX:[id number] of systems complete and found to be negative unless listed above  PHYSICAL EXAM BP 152/86  Pulse 68  Ht 6\' 6"  (1.981 m)  Wt 268 lb (121.564 kg)  BMI 30.98 kg/m2  General: Well developed, well nourished, in no acute distress Head: Eyes PERRLA, No xanthomas.   Normal cephalic and atramatic  Lungs: Clear bilaterally to auscultation and percussion. Heart: HRRR S1 S2, without MRG.  Pulses are 2+ & equal.            No carotid bruit. No JVD.  No abdominal bruits. No femoral bruits. Abdomen: Bowel sounds are positive, abdomen soft and non-tender without masses  or                  Hernia's noted. Msk:  Back normal, normal gait. Normal strength and tone for age. Extremities: No clubbing, cyanosis or edema.  DP +1 Neuro: Alert and oriented X 3. Psych:  Good affect, responds appropriately  EKG: NSR with mild LVH  ASSESSMENT AND PLAN

## 2012-06-17 NOTE — Patient Instructions (Addendum)
Your physician recommends that you schedule a follow-up appointment in: 6 months  Your physician recommends that you return for lab work in: CMET,LIPID TODAY-SLIPS GIVEN  Your physician has recommended you make the following change in your medication:   1) INCREASE NORVASC TO 10MG  ONCE DAILY

## 2012-06-17 NOTE — Assessment & Plan Note (Signed)
We will check fasting lipids and LFTs. He is currently not on a statin. With known history of CAD would recommend that he be placed on this. We will wait on lab results to make recommendation on dosing

## 2012-06-17 NOTE — Assessment & Plan Note (Signed)
Blood pressure is now well controlled. He is gain 25 pounds since being seen last. I will increase his Norvasc to 10 mg daily. We will check labs as these have been completed in 2 years to include a CMET.

## 2012-06-17 NOTE — Assessment & Plan Note (Addendum)
He is without cardiac complaints. We will concentrate on risk modification and management. Continue beta blocker and aspirin.

## 2012-06-18 ENCOUNTER — Encounter: Payer: Self-pay | Admitting: *Deleted

## 2012-06-18 LAB — COMPREHENSIVE METABOLIC PANEL
ALT: 27 U/L (ref 0–53)
AST: 25 U/L (ref 0–37)
Albumin: 4.6 g/dL (ref 3.5–5.2)
Alkaline Phosphatase: 55 U/L (ref 39–117)
BUN: 17 mg/dL (ref 6–23)
CO2: 26 mEq/L (ref 19–32)
Calcium: 9.6 mg/dL (ref 8.4–10.5)
Chloride: 103 mEq/L (ref 96–112)
Creat: 1.29 mg/dL (ref 0.50–1.35)
Glucose, Bld: 76 mg/dL (ref 70–99)
Potassium: 4.8 mEq/L (ref 3.5–5.3)
Sodium: 139 mEq/L (ref 135–145)
Total Bilirubin: 0.5 mg/dL (ref 0.3–1.2)
Total Protein: 7.7 g/dL (ref 6.0–8.3)

## 2012-06-18 LAB — LIPID PANEL
Cholesterol: 198 mg/dL (ref 0–200)
HDL: 42 mg/dL (ref >39)
LDL Cholesterol: 125 mg/dL — ABNORMAL HIGH (ref 0–99)
Total CHOL/HDL Ratio: 4.7 Ratio
Triglycerides: 156 mg/dL — ABNORMAL HIGH (ref <150)
VLDL: 31 mg/dL (ref 0–40)

## 2012-06-18 MED ORDER — ATORVASTATIN CALCIUM 20 MG PO TABS: 20.0000 mg | ORAL_TABLET | Freq: Every day | ORAL | Status: DC

## 2012-06-18 NOTE — Addendum Note (Signed)
Addended by: Derry Lory A on: 06/18/2012 05:39 PM   Modules accepted: Orders

## 2012-09-17 ENCOUNTER — Other Ambulatory Visit: Payer: Self-pay | Admitting: *Deleted

## 2012-09-17 ENCOUNTER — Encounter: Payer: Self-pay | Admitting: *Deleted

## 2012-09-17 DIAGNOSIS — I2510 Atherosclerotic heart disease of native coronary artery without angina pectoris: Secondary | ICD-10-CM

## 2012-09-17 DIAGNOSIS — I10 Essential (primary) hypertension: Secondary | ICD-10-CM

## 2012-09-17 DIAGNOSIS — E785 Hyperlipidemia, unspecified: Secondary | ICD-10-CM

## 2012-09-17 DIAGNOSIS — E7800 Pure hypercholesterolemia, unspecified: Secondary | ICD-10-CM

## 2012-10-26 LAB — COMPREHENSIVE METABOLIC PANEL
ALT: 78 U/L — ABNORMAL HIGH (ref 0–53)
AST: 63 U/L — ABNORMAL HIGH (ref 0–37)
Albumin: 4.5 g/dL (ref 3.5–5.2)
Alkaline Phosphatase: 47 U/L (ref 39–117)
BUN: 16 mg/dL (ref 6–23)
CO2: 19 mEq/L (ref 19–32)
Calcium: 9.2 mg/dL (ref 8.4–10.5)
Chloride: 106 mEq/L (ref 96–112)
Creat: 1.15 mg/dL (ref 0.50–1.35)
Glucose, Bld: 87 mg/dL (ref 70–99)
Potassium: 4.2 mEq/L (ref 3.5–5.3)
Sodium: 138 mEq/L (ref 135–145)
Total Bilirubin: 0.5 mg/dL (ref 0.3–1.2)
Total Protein: 7.8 g/dL (ref 6.0–8.3)

## 2012-10-26 LAB — HEPATIC FUNCTION PANEL
ALT: 78 U/L — ABNORMAL HIGH (ref 0–53)
AST: 63 U/L — ABNORMAL HIGH (ref 0–37)
Albumin: 4.6 g/dL (ref 3.5–5.2)
Alkaline Phosphatase: 47 U/L (ref 39–117)
Bilirubin, Direct: 0.1 mg/dL (ref 0.0–0.3)
Indirect Bilirubin: 0.4 mg/dL (ref 0.0–0.9)
Total Bilirubin: 0.5 mg/dL (ref 0.3–1.2)
Total Protein: 7.9 g/dL (ref 6.0–8.3)

## 2012-10-26 LAB — LIPID PANEL
Cholesterol: 194 mg/dL (ref 0–200)
HDL: 46 mg/dL (ref >39)
LDL Cholesterol: 123 mg/dL — ABNORMAL HIGH (ref 0–99)
Total CHOL/HDL Ratio: 4.2 Ratio
Triglycerides: 124 mg/dL (ref <150)
VLDL: 25 mg/dL (ref 0–40)

## 2012-10-29 MED ORDER — PRAVASTATIN SODIUM 20 MG PO TABS: 20.0000 mg | ORAL_TABLET | Freq: Every evening | ORAL | Status: DC

## 2012-10-29 NOTE — Addendum Note (Signed)
Addended by: Thompson Grayer on: 10/29/2012 08:56 AM   Modules accepted: Orders, Medications

## 2012-10-30 ENCOUNTER — Telehealth: Payer: Self-pay | Admitting: Adult Health

## 2012-10-30 NOTE — Telephone Encounter (Signed)
Spoke to patient concerning lab/test results/instructions from provider. Patient understood.    

## 2012-10-30 NOTE — Telephone Encounter (Signed)
Patient has questions regarding medication change.  Please call after 4pm. / tgs

## 2012-12-26 ENCOUNTER — Other Ambulatory Visit: Payer: Self-pay

## 2012-12-27 ENCOUNTER — Other Ambulatory Visit: Payer: Self-pay | Admitting: Adult Health

## 2012-12-30 ENCOUNTER — Other Ambulatory Visit: Payer: Self-pay | Admitting: Adult Health

## 2012-12-31 ENCOUNTER — Ambulatory Visit: Payer: Self-pay | Admitting: Adult Health

## 2013-01-07 ENCOUNTER — Ambulatory Visit (INDEPENDENT_AMBULATORY_CARE_PROVIDER_SITE_OTHER): Payer: 59 | Admitting: Adult Health

## 2013-01-07 ENCOUNTER — Encounter: Payer: Self-pay | Admitting: Adult Health

## 2013-01-07 VITALS — BP 132/64 | HR 83 | Ht 78.0 in | Wt 273.0 lb

## 2013-01-07 DIAGNOSIS — I2510 Atherosclerotic heart disease of native coronary artery without angina pectoris: Secondary | ICD-10-CM

## 2013-01-07 DIAGNOSIS — I10 Essential (primary) hypertension: Secondary | ICD-10-CM

## 2013-01-07 MED ORDER — AMLODIPINE BESYLATE 10 MG PO TABS: ORAL_TABLET | ORAL | Status: DC

## 2013-01-07 NOTE — Progress Notes (Signed)
    HPI: Nathaniel Kline is a 62 year old former patient of Dr. Daleen Squibb we are following for ongoing assessment and management of nonobstructive CAD, secondary cardiomyopathy secondary to alcohol abuse, hypertension, and hyperlipidemia. He was last seen in our office in April of 2014. At that time the patient was stable. No changes were made to his medication regimen.    He is doing very well, medically compliant and has not been in the ER of the hospital since being seen last. He is trying to be established with Dr. Juanetta Gosling and is due to see him in the next couple of weeks.   No Known Allergies  Current Outpatient Prescriptions  Medication Sig Dispense Refill  . amLODipine (NORVASC) 10 MG tablet TAKE 1 TABLET (10 MG TOTAL) BY MOUTH DAILY.  30 tablet  9  . aspirin 81 MG EC tablet Take 81 mg by mouth daily.        . Multiple Vitamin (MULTIVITAMIN) tablet Take 1 tablet by mouth daily.         No current facility-administered medications for this visit.    Past Medical History  Diagnosis Date  . Alcohol abuse   . HLD (hyperlipidemia)   . HTN (hypertension)     Past Surgical History  Procedure Laterality Date  . 2 caths  10/21/05    WUJ:[id number] of systems complete and found to be negative unless listed above  PHYSICAL EXAM BP 132/64  Pulse 83  Ht 6\' 6"  (1.981 m)  Wt 192 lb (87.091 kg)  BMI 22.19 kg/m2  General: Well developed, well nourished, in no acute distress Head: Eyes PERRLA, No xanthomas.   Normal cephalic and atramatic  Lungs: Clear bilaterally to auscultation and percussion. Heart: HRRR S1 S2, without MRG.  Pulses are 2+ & equal.            No carotid bruit. No JVD.  No abdominal bruits. No femoral bruits. Abdomen: Bowel sounds are positive, abdomen soft and non-tender without masses or                  Hernia's noted. Msk:  Back normal, normal gait. Normal strength and tone for age. Extremities: No clubbing, cyanosis or edema.  DP +1 Neuro: Alert and oriented X 3. Psych:  Good  affect, responds appropriately    ASSESSMENT AND PLAN

## 2013-01-07 NOTE — Patient Instructions (Signed)
Your physician recommends that you schedule a follow-up appointment in: 9 months You will receive a reminder letter two months in advance reminding you to call and schedule your appointment. If you don't receive this letter, please contact our office.  Your physician recommends that you continue on your current medications as directed. Please refer to the Current Medication list given to you today.

## 2013-01-07 NOTE — Assessment & Plan Note (Signed)
Excellent control of BP at this time on amlodipine 10 mg daily. He is avoiding salt. He will be given refills on this. He will be seen in 9 months. Labs are to be done by Dr. Juanetta Gosling at patients requests.

## 2013-01-07 NOTE — Progress Notes (Deleted)
Name: Nathaniel Kline    DOB: [birth year]  Age: 62 y.o.  MR#: [id number]       PCP:  No primary provider on file.      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 Hypertension  . Coronary Artery Disease    VS Filed Vitals:   01/07/13 1526  BP: 132/64  Pulse: 83  Height: 6\' 6"  (1.981 m)  Weight: 192 lb (87.091 kg)    Weights Current Weight  01/07/13 192 lb (87.091 kg)  06/17/12 268 lb (121.564 kg)  09/29/10 242 lb (109.77 kg)    Blood Pressure  BP Readings from Last 3 Encounters:  01/07/13 132/64  06/17/12 152/86  09/29/10 145/83     Admit date:  (Not on file) Last encounter with RMR:  12/30/2012   Allergy Review of patient's allergies indicates no known allergies.  Current Outpatient Prescriptions  Medication Sig Dispense Refill  . amLODipine (NORVASC) 10 MG tablet TAKE 1 TABLET (10 MG TOTAL) BY MOUTH DAILY.  90 tablet  1  . amLODipine (NORVASC) 10 MG tablet TAKE 1 TABLET (10 MG TOTAL) BY MOUTH DAILY.  30 tablet  0  . aspirin 81 MG EC tablet Take 81 mg by mouth daily.        . Multiple Vitamin (MULTIVITAMIN) tablet Take 1 tablet by mouth daily.         No current facility-administered medications for this visit.    Discontinued Meds:    Medications Discontinued During This Encounter  Medication Reason  . pravastatin (PRAVACHOL) 20 MG tablet Error    Patient Active Problem List   Diagnosis Date Noted  . CAD, NATIVE VESSEL 09/07/2009  . DIZZINESS 11/27/2008  . HYPERLIPIDEMIA 11/26/2008  . ALCOHOL ABUSE 11/26/2008  . HYPERTENSION 11/26/2008    LABS    Component Value Date/Time   NA 138 10/26/2012 0910   NA 139 06/17/2012 1501   NA 140 06/20/2009 2311   K 4.2 10/26/2012 0910   K 4.8 06/17/2012 1501   K 3.6 06/20/2009 2311   CL 106 10/26/2012 0910   CL 103 06/17/2012 1501   CL 106 06/20/2009 2311   CO2 19 10/26/2012 0910   CO2 26 06/17/2012 1501   CO2 23 06/20/2009  2311   GLUCOSE 87 10/26/2012 0910   GLUCOSE 76 06/17/2012 1501   GLUCOSE 112* 06/20/2009 2311   BUN 16 10/26/2012 0910   BUN 17 06/17/2012 1501   BUN 15 06/20/2009 2311   CREATININE 1.15 10/26/2012 0910   CREATININE 1.29 06/17/2012 1501   CREATININE 1.31 06/20/2009 2311   CREATININE 1.20 09/13/2008 0340   CREATININE 1.22 09/11/2008 0555   CALCIUM 9.2 10/26/2012 0910   CALCIUM 9.6 06/17/2012 1501   CALCIUM 8.8 06/20/2009 2311   GFRNONAA 56* 06/20/2009 2311   GFRNONAA >60 09/13/2008 0340   GFRNONAA >60 09/11/2008 0555   GFRAA  Value: >60        The eGFR has been calculated using the MDRD equation. This calculation has not been validated in all clinical situations. eGFR's persistently <60 mL/min signify possible Chronic Kidney Disease. 06/20/2009 2311   GFRAA  Value: >60        The eGFR has been calculated using the MDRD equation. This calculation has not been validated in all clinical situations. eGFR's persistently <60 mL/min signify possible Chronic Kidney Disease. 09/13/2008 0340   GFRAA  Value: >60        The eGFR has been calculated using the MDRD equation. This calculation has not been validated in all clinical situations. eGFR's persistently <60 mL/min signify possible Chronic Kidney Disease. 09/11/2008 0555   CMP     Component Value Date/Time   NA 138 10/26/2012 0910   K 4.2 10/26/2012 0910   CL 106 10/26/2012 0910   CO2 19 10/26/2012 0910   GLUCOSE 87 10/26/2012 0910   BUN 16 10/26/2012 0910   CREATININE 1.15 10/26/2012 0910   CREATININE 1.31 06/20/2009 2311   CALCIUM 9.2 10/26/2012 0910   PROT 7.9 10/26/2012 0911   ALBUMIN 4.6 10/26/2012 0911   AST 63* 10/26/2012 0911   ALT 78* 10/26/2012 0911   ALKPHOS 47 10/26/2012 0911   BILITOT 0.5 10/26/2012 0911   GFRNONAA 56* 06/20/2009 2311   GFRAA  Value: >60        The eGFR has been calculated using the MDRD equation. This calculation has not been validated in all clinical situations. eGFR's persistently <60 mL/min signify possible Chronic Kidney Disease. 06/20/2009 2311        Component Value Date/Time   WBC 6.4 06/20/2009 2311   WBC 11.0* 09/13/2008 0340   WBC 13.9* 09/12/2008 0335   HGB 14.5 06/20/2009 2311   HGB 12.8* 09/13/2008 0340   HGB 12.3* 09/12/2008 0335   HCT 41.9 06/20/2009 2311   HCT 37.3* 09/13/2008 0340   HCT 35.9* 09/12/2008 0335   MCV 97.8 06/20/2009 2311   MCV 100.5* 09/13/2008 0340   MCV 100.1* 09/12/2008 0335    Lipid Panel     Component Value Date/Time   CHOL 194 10/26/2012 0913   TRIG 124 10/26/2012 0913   HDL 46 10/26/2012 0913   CHOLHDL 4.2 10/26/2012 0913   VLDL 25 10/26/2012 0913   LDLCALC 123* 10/26/2012 0913    ABG No results found for this basename: phart, pco2, pco2art, po2, po2art, hco3, tco2, acidbasedef, o2sat     Lab Results  Component Value Date   TSH 6.484 ***Test methodology is 3rd generation TSH**** 09/13/2008   BNP (last 3 results) No results found for this basename: PROBNP,  in the last 8760 hours Cardiac Panel (last 3 results) No results found for this basename: CKTOTAL, CKMB, TROPONINI, RELINDX,  in the last 72 hours  Iron/TIBC/Ferritin No results found for this basename: iron, tibc, ferritin     EKG Orders placed in visit on 06/17/12  . EKG 12-LEAD     Prior Assessment and Plan Problem List as of 01/07/2013     Cardiovascular and Mediastinum   HYPERTENSION   Last Assessment & Plan   06/17/2012 Office Visit Written 06/17/2012  3:41 PM by Jodelle Gross, NP     Blood pressure is now well controlled. He is gain 25 pounds since being seen last. I will increase his Norvasc to 10 mg daily. We will check labs as these have been completed in 2 years to include a CMET.    CAD, NATIVE VESSEL   Last Assessment & Plan   06/17/2012 Office Visit Edited 06/17/2012  3:43 PM by Jodelle Gross, NP     He is without cardiac complaints. We will concentrate on risk modification and management. Continue beta blocker and aspirin.      Other   HYPERLIPIDEMIA   Last Assessment & Plan   06/17/2012 Office Visit Written 06/17/2012   3:43 PM by Jodelle Gross, NP     We will check fasting  lipids and LFTs. He is currently not on a statin. With known history of CAD would recommend that he be placed on this. We will wait on lab results to make recommendation on dosing    ALCOHOL ABUSE   DIZZINESS       Imaging: No results found.

## 2013-01-07 NOTE — Assessment & Plan Note (Signed)
No complaints of chest pain or fatigue. 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

## 2013-03-03 ENCOUNTER — Emergency Department (HOSPITAL_COMMUNITY)
Admission: EM | Admit: 2013-03-03 | Discharge: 2013-03-03 | Disposition: A | Discharge status: Home / Self Care | Payer: BC Managed Care – PPO | Attending: Emergency Medicine | Admitting: Emergency Medicine

## 2013-03-03 ENCOUNTER — Emergency Department (HOSPITAL_COMMUNITY): Payer: BC Managed Care – PPO

## 2013-03-03 ENCOUNTER — Encounter (HOSPITAL_COMMUNITY): Payer: Self-pay | Admitting: Emergency Medicine

## 2013-03-03 DIAGNOSIS — I10 Essential (primary) hypertension: Secondary | ICD-10-CM | POA: Insufficient documentation

## 2013-03-03 DIAGNOSIS — E860 Dehydration: Secondary | ICD-10-CM

## 2013-03-03 DIAGNOSIS — Z79899 Other long term (current) drug therapy: Secondary | ICD-10-CM | POA: Insufficient documentation

## 2013-03-03 DIAGNOSIS — F1010 Alcohol abuse, uncomplicated: Secondary | ICD-10-CM

## 2013-03-03 DIAGNOSIS — Z7982 Long term (current) use of aspirin: Secondary | ICD-10-CM | POA: Insufficient documentation

## 2013-03-03 DIAGNOSIS — Z87891 Personal history of nicotine dependence: Secondary | ICD-10-CM | POA: Insufficient documentation

## 2013-03-03 LAB — COMPREHENSIVE METABOLIC PANEL
ALT: 98 U/L — ABNORMAL HIGH (ref 0–53)
AST: 110 U/L — ABNORMAL HIGH (ref 0–37)
Albumin: 4.4 g/dL (ref 3.5–5.2)
Alkaline Phosphatase: 57 U/L (ref 39–117)
BUN: 11 mg/dL (ref 6–23)
CO2: 22 mEq/L (ref 19–32)
Calcium: 9.4 mg/dL (ref 8.4–10.5)
Chloride: 94 mEq/L — ABNORMAL LOW (ref 96–112)
Creatinine, Ser: 1.04 mg/dL (ref 0.50–1.35)
GFR calc Af Amer: 88 mL/min — ABNORMAL LOW (ref >90)
GFR calc non Af Amer: 76 mL/min — ABNORMAL LOW (ref >90)
Glucose, Bld: 101 mg/dL — ABNORMAL HIGH (ref 70–99)
Potassium: 3.7 mEq/L (ref 3.7–5.3)
Sodium: 138 mEq/L (ref 137–147)
Total Bilirubin: 0.8 mg/dL (ref 0.3–1.2)
Total Protein: 8.5 g/dL — ABNORMAL HIGH (ref 6.0–8.3)

## 2013-03-03 LAB — LIPASE, BLOOD: Lipase: 69 U/L — ABNORMAL HIGH (ref 11–59)

## 2013-03-03 LAB — CBC WITH DIFFERENTIAL/PLATELET
Basophils Absolute: 0 10*3/uL (ref 0.0–0.1)
Basophils Relative: 1 % (ref 0–1)
Eosinophils Absolute: 0 10*3/uL (ref 0.0–0.7)
Eosinophils Relative: 1 % (ref 0–5)
HCT: 43.9 % (ref 39.0–52.0)
Hemoglobin: 15.3 g/dL (ref 13.0–17.0)
Lymphocytes Relative: 25 % (ref 12–46)
Lymphs Abs: 1.6 10*3/uL (ref 0.7–4.0)
MCH: 34.3 pg — ABNORMAL HIGH (ref 26.0–34.0)
MCHC: 34.9 g/dL (ref 30.0–36.0)
MCV: 98.4 fL (ref 78.0–100.0)
Monocytes Absolute: 0.6 10*3/uL (ref 0.1–1.0)
Monocytes Relative: 9 % (ref 3–12)
Neutro Abs: 4.1 10*3/uL (ref 1.7–7.7)
Neutrophils Relative %: 65 % (ref 43–77)
Platelets: 215 10*3/uL (ref 150–400)
RBC: 4.46 MIL/uL (ref 4.22–5.81)
RDW: 13.1 % (ref 11.5–15.5)
WBC: 6.3 10*3/uL (ref 4.0–10.5)

## 2013-03-03 LAB — TROPONIN I: Troponin I: <0.3 ng/mL (ref <0.30)

## 2013-03-03 LAB — ETHANOL: Alcohol, Ethyl (B): 83 mg/dL — ABNORMAL HIGH (ref 0–11)

## 2013-03-03 LAB — GLUCOSE, CAPILLARY: Glucose-Capillary: 96 mg/dL (ref 70–99)

## 2013-03-03 LAB — CG4 I-STAT (LACTIC ACID): Lactic Acid, Venous: 3.93 mmol/L — ABNORMAL HIGH (ref 0.5–2.2)

## 2013-03-03 MED ORDER — SODIUM CHLORIDE 0.9 % IV BOLUS (SEPSIS): 500.0000 mL | Freq: Once | INTRAVENOUS | Status: DC

## 2013-03-03 MED ORDER — SODIUM CHLORIDE 0.9 % IV BOLUS (SEPSIS)
500.0000 mL | Freq: Once | INTRAVENOUS | Status: AC
  Administered 2013-03-03: 500 mL via INTRAVENOUS

## 2013-03-03 NOTE — ED Notes (Signed)
Pt states dizziness began when he woke up and has gotten worse. Denies any pain or any other symptoms.

## 2013-03-03 NOTE — ED Provider Notes (Signed)
CSN: [id number]     Arrival date & time 03/03/13  1159 History   First MD Initiated Contact with Patient 03/03/13 1309 This chart was scribed for Maudry Diego, MD by Anastasia Pall, ED Scribe. This patient was seen in room APA01/APA01 and the patient's care was started at 1:14 PM.      Chief Complaint  Patient presents with  . Dizziness    Patient is a 63 y.o. male presenting with dizziness. The history is provided by the patient. No language interpreter was used.  Dizziness Quality:  Room spinning Duration: after waking up this morning. Chronicity:  New Context: not with loss of consciousness   Associated symptoms: no chest pain, no diarrhea, no headaches and no syncope    HPI Comments: Nathaniel Kline is a 63 y.o. male who presents to the Emergency Department complaining of gradually worsening dizziness, onset after waking up this morning. He denies h/o similar symptoms. He reports not having seen anyone for his symptoms. He denies having been sick recently. He denies any pain, and any other associated symptoms.   PCP - No PCP Per Patient  Past Medical History  Diagnosis Date  . Alcohol abuse   . HLD (hyperlipidemia)   . HTN (hypertension)    Past Surgical History  Procedure Laterality Date  . 2 caths  10/21/05    caridac cath   Family History  Problem Relation Age of Onset  . Hypertension      family hx   History  Substance Use Topics  . Smoking status: Former Research scientist (life sciences)  . Smokeless tobacco: Never Used     Comment: tobacco use - no  . Alcohol Use: Yes     Comment: Occ    Review of Systems  Constitutional: Negative for appetite change and fatigue.  HENT: Negative for congestion, ear discharge and sinus pressure.   Eyes: Negative for discharge.  Respiratory: Negative for cough.   Cardiovascular: Negative for chest pain and syncope.  Gastrointestinal: Negative for abdominal pain and diarrhea.  Genitourinary: Negative for frequency and hematuria.  Musculoskeletal:  Negative for back pain.  Skin: Negative for rash.  Neurological: Positive for dizziness. Negative for seizures and headaches.  Psychiatric/Behavioral: Negative for hallucinations.    Allergies  Review of patient's allergies indicates no known allergies.  Home Medications   Current Outpatient Rx  Name  Route  Sig  Dispense  Refill  . amLODipine (NORVASC) 10 MG tablet      TAKE 1 TABLET (10 MG TOTAL) BY MOUTH DAILY.   30 tablet   9   . aspirin 81 MG EC tablet   Oral   Take 81 mg by mouth daily.           . diphenhydrAMINE (BENADRYL) 25 mg capsule   Oral   Take 25 mg by mouth every 6 (six) hours as needed for itching or allergies.         . Multiple Vitamin (MULTIVITAMIN) tablet   Oral   Take 1 tablet by mouth daily.            BP 98/77  Pulse 117  Temp(Src) 98.3 F (36.8 C) (Oral)  Resp 16  Ht 6\' 6"  (1.981 m)  Wt 260 lb (117.935 kg)  BMI 30.05 kg/m2  SpO2 100%  Physical Exam  Constitutional: He is oriented to person, place, and time. He appears well-developed.  HENT:  Head: Normocephalic.  Eyes: Conjunctivae and EOM are normal. No scleral icterus.  Neck: Neck supple. No  thyromegaly present.  Cardiovascular: Normal rate and normal heart sounds.  Exam reveals no gallop and no friction rub.   No murmur heard. Pulmonary/Chest: Effort normal and breath sounds normal. No stridor. He has no wheezes. He has no rales. He exhibits no tenderness.  Abdominal: He exhibits no distension. There is no tenderness. There is no rebound.  Musculoskeletal: Normal range of motion. He exhibits no edema.  Lymphadenopathy:    He has no cervical adenopathy.  Neurological: He is oriented to person, place, and time. He exhibits normal muscle tone. Coordination normal.  Skin: No rash noted. No erythema.  Psychiatric: He has a normal mood and affect. His behavior is normal.    ED Course  Procedures (including critical care time)  DIAGNOSTIC STUDIES: Oxygen Saturation is 100% on  room air, normal by my interpretation.    COORDINATION OF CARE: 1:14 PM-Discussed treatment plan which includes CT head with pt at bedside and pt agreed to plan.   Results for orders placed during the hospital encounter of 03/03/13  GLUCOSE, CAPILLARY      Result Value Range   Glucose-Capillary 96  70 - 99 mg/dL  CBC WITH DIFFERENTIAL      Result Value Range   WBC 6.3  4.0 - 10.5 K/uL   RBC 4.46  4.22 - 5.81 MIL/uL   Hemoglobin 15.3  13.0 - 17.0 g/dL   HCT 43.9  39.0 - 52.0 %   MCV 98.4  78.0 - 100.0 fL   MCH 34.3 (*) 26.0 - 34.0 pg   MCHC 34.9  30.0 - 36.0 g/dL   RDW 13.1  11.5 - 15.5 %   Platelets 215  150 - 400 K/uL   Neutrophils Relative % 65  43 - 77 %   Neutro Abs 4.1  1.7 - 7.7 K/uL   Lymphocytes Relative 25  12 - 46 %   Lymphs Abs 1.6  0.7 - 4.0 K/uL   Monocytes Relative 9  3 - 12 %   Monocytes Absolute 0.6  0.1 - 1.0 K/uL   Eosinophils Relative 1  0 - 5 %   Eosinophils Absolute 0.0  0.0 - 0.7 K/uL   Basophils Relative 1  0 - 1 %   Basophils Absolute 0.0  0.0 - 0.1 K/uL  COMPREHENSIVE METABOLIC PANEL      Result Value Range   Sodium 138  137 - 147 mEq/L   Potassium 3.7  3.7 - 5.3 mEq/L   Chloride 94 (*) 96 - 112 mEq/L   CO2 22  19 - 32 mEq/L   Glucose, Bld 101 (*) 70 - 99 mg/dL   BUN 11  6 - 23 mg/dL   Creatinine, Ser 1.04  0.50 - 1.35 mg/dL   Calcium 9.4  8.4 - 10.5 mg/dL   Total Protein 8.5 (*) 6.0 - 8.3 g/dL   Albumin 4.4  3.5 - 5.2 g/dL   AST 110 (*) 0 - 37 U/L   ALT 98 (*) 0 - 53 U/L   Alkaline Phosphatase 57  39 - 117 U/L   Total Bilirubin 0.8  0.3 - 1.2 mg/dL   GFR calc non Af Amer 76 (*) >90 mL/min   GFR calc Af Amer 88 (*) >90 mL/min  LIPASE, BLOOD      Result Value Range   Lipase 69 (*) 11 - 59 U/L  TROPONIN I      Result Value Range   Troponin I <0.30  <0.30 ng/mL  ETHANOL  Result Value Range   Alcohol, Ethyl (B) 83 (*) 0 - 11 mg/dL  CG4 I-STAT (LACTIC ACID)      Result Value Range   Lactic Acid, Venous 3.93 (*) 0.5 - 2.2 mmol/L   Dg  Chest 1 View  03/03/2013   CLINICAL DATA:  Dizziness  EXAM: CHEST - 1 VIEW  COMPARISON:  November 29, 2009  FINDINGS: There is underlying emphysematous change. There is no edema or consolidation. Heart is upper normal in size. Pulmonary vascularity reflects underlying emphysema. No adenopathy. There is upper thoracic levoscoliosis and mid thoracic dextroscoliosis.  IMPRESSION: Underlying emphysema.  No edema or consolidation.   Electronically Signed   By: Lowella Grip M.D.   On: 03/03/2013 14:15   Ct Head Wo Contrast  03/03/2013   CLINICAL DATA:  Dizziness.  EXAM: CT HEAD WITHOUT CONTRAST  TECHNIQUE: Contiguous axial images were obtained from the base of the skull through the vertex without contrast.  COMPARISON:  None  FINDINGS: No evidence for acute hemorrhage, mass lesion, midline shift, hydrocephalus or large infarct. There is mild cerebral atrophy. There is subtle low-density in the periventricular white matter suggesting chronic changes. Visualized sinuses are clear. No acute bone abnormality.  IMPRESSION: No acute intracranial abnormality.  Mild atrophy and suspect mild chronic small vessel ischemic changes.   Electronically Signed   By: Markus Daft M.D.   On: 03/03/2013 14:02    EKG Interpretation    Date/Time:  Monday March 03 2013 12:27:19 EST Ventricular Rate:  94 PR Interval:  164 QRS Duration: 90 QT Interval:  394 QTC Calculation: 492 R Axis:   -108 Text Interpretation:  Sinus rhythm with Premature atrial complexes Right superior axis deviation Pulmonary disease pattern Septal infarct (cited on or before 19-Jan-2008) Abnormal ECG When compared with ECG of 19-Jan-2008 22:05, Premature atrial complexes are now Present Left posterior fascicular block is no longer Present Borderline criteria for Inferior infarct are no longer Present Questionable change in initial forces of Septal leads Confirmed by Adahlia Stembridge  MD, Khalis Hittle ([id number]) on 03/03/2013 1:16:35 PM           Medications   sodium chloride 0.9 % bolus 500 mL (0 mLs Intravenous Stopped 03/03/13 1335)    MDM  Dehydration,  Alcohol abuse,  htn   Maudry Diego, MD 03/03/13 [phone]

## 2013-03-03 NOTE — ED Notes (Signed)
Patient with dizziness with standing. States ETOH this weekend socially, but did not get drunk. Reports bilateral lower leg swelling. 3+pitting edema noted to bilateral ankles. Denies cardiac history besides HTN. +Orthostatic. DR Jeneen Rinks consulted about IV fluid bolus due to orthostasis and leg swelling, and 500 ml NS bolus obtained verbally from MD. Verbal order for chest x ray as well.

## 2013-03-03 NOTE — ED Notes (Signed)
Pt stated vision changes and swelling to lower extremities upon entering treatment room while talking with primary nurse.

## 2013-03-03 NOTE — Discharge Instructions (Signed)
Drink plenty of fluids.  Follow up with dr.  Lucia Gaskins in one week.  Decrease alcohol consumption

## 2013-05-28 ENCOUNTER — Other Ambulatory Visit: Payer: Self-pay | Admitting: Orthopedic Surgery

## 2013-05-28 ENCOUNTER — Ambulatory Visit (HOSPITAL_COMMUNITY)
Admission: RE | Admit: 2013-05-28 | Discharge: 2013-05-28 | Disposition: A | Discharge status: Home / Self Care | Payer: BC Managed Care – PPO | Source: Ambulatory Visit | Attending: Orthopedic Surgery | Admitting: Orthopedic Surgery

## 2013-05-28 DIAGNOSIS — IMO0002 Reserved for concepts with insufficient information to code with codable children: Secondary | ICD-10-CM | POA: Insufficient documentation

## 2013-05-28 DIAGNOSIS — M25569 Pain in unspecified knee: Secondary | ICD-10-CM | POA: Insufficient documentation

## 2013-05-28 DIAGNOSIS — M25561 Pain in right knee: Secondary | ICD-10-CM

## 2013-05-28 DIAGNOSIS — M1710 Unilateral primary osteoarthritis, unspecified knee: Secondary | ICD-10-CM | POA: Insufficient documentation

## 2013-05-28 DIAGNOSIS — M25469 Effusion, unspecified knee: Secondary | ICD-10-CM | POA: Insufficient documentation

## 2013-06-05 ENCOUNTER — Encounter: Payer: Self-pay | Admitting: Orthopedic Surgery

## 2013-06-05 ENCOUNTER — Ambulatory Visit (INDEPENDENT_AMBULATORY_CARE_PROVIDER_SITE_OTHER): Payer: BC Managed Care – PPO | Admitting: Orthopedic Surgery

## 2013-06-05 VITALS — BP 145/97 | Ht 78.0 in | Wt 253.0 lb

## 2013-06-05 DIAGNOSIS — IMO0002 Reserved for concepts with insufficient information to code with codable children: Secondary | ICD-10-CM

## 2013-06-05 DIAGNOSIS — M1990 Unspecified osteoarthritis, unspecified site: Secondary | ICD-10-CM

## 2013-06-05 DIAGNOSIS — M129 Arthropathy, unspecified: Secondary | ICD-10-CM

## 2013-06-05 DIAGNOSIS — M1710 Unilateral primary osteoarthritis, unspecified knee: Secondary | ICD-10-CM

## 2013-06-05 DIAGNOSIS — M25469 Effusion, unspecified knee: Secondary | ICD-10-CM

## 2013-06-05 MED ORDER — NABUMETONE 500 MG PO TABS: 500.0000 mg | ORAL_TABLET | Freq: Two times a day (BID) | ORAL | Status: DC

## 2013-06-05 NOTE — Patient Instructions (Addendum)
Ice 3 x a day for 2 days   relafen 500 mg twice a day   Return to work 22nd   Refer to Dr. Oneida Alar for colonoscopy  Work on getting primary [place] After  Refer to this sheet in the next few days. These instructions provide you with information on caring for yourself after you have had a joint injection. Your caregiver also may give you more specific instructions. Your treatment has been planned according to current medical practices, but problems sometimes occur. Call your caregiver if you have any problems or questions after your procedure.  After any type of joint injection, it is not uncommon to experience:  Soreness, swelling, or bruising around the injection site.  Mild numbness, tingling, or weakness around the injection site caused by the numbing medicine used before or with the injection. It also is possible to experience the following effects associated with the specific agent after injection:  Iodine-based contrast agents:  Allergic reaction (itching, hives, widespread redness, and swelling beyond the injection site).  Corticosteroids (These effects are rare.):  Allergic reaction.  Increased blood sugar levels (If you have diabetes and you notice that your blood sugar levels have increased, notify your caregiver).  Increased blood pressure levels.  Mood swings.  Hyaluronic acid in the use of viscosupplementation.  Temporary heat or redness.  Temporary rash and itching.  Increased fluid accumulation in the injected joint. These effects all should resolve within a day after your procedure.  HOME CARE INSTRUCTIONS  Limit yourself to light activity the day of your procedure. Avoid lifting heavy objects, bending, stooping, or twisting.  Take prescription or over-the-counter pain medication as directed by your caregiver.  You may apply ice to your injection site to reduce pain and swelling the day of your procedure. Ice may be applied 3-4 times:  Put ice  in a plastic bag.  Place a towel between your skin and the bag.  Leave the ice on for no longer than 15-20 minutes each time. SEEK IMMEDIATE MEDICAL CARE IF:  Pain and swelling get worse rather than better or extend beyond the injection site.  Numbness does not go away.  Blood or fluid continues to leak from the injection site.  You have chest pain.  You have swelling of your face or tongue.  You have trouble breathing or you become dizzy.  You develop a fever, chills, or severe tenderness at the injection site that last longer than 1 day. MAKE SURE YOU:  Understand these instructions.  Watch your condition.  Get help right away if you are not doing well or if you get worse. Document Released: 10/20/2010 Document Revised: 05/01/2011 Document Reviewed: 10/20/2010  [facility] Patient Information 2014 [place].

## 2013-06-05 NOTE — Progress Notes (Signed)
Patient ID: Nathaniel Kline, male   DOB: Oct 19, 1950, 63 y.o.   MRN: [id number]  Chief Complaint  Patient presents with  . Knee Pain    Right knee pain,and swelling ,no injury    63 year old male presents with a three-week history of sudden onset of right knee pain and swelling. He describes sharp throbbing pain 8/10 constant morning and night worse with walking associated with joint effusion no trauma  History of previous knee pain over the years nothing in the last year. Has a history of hypertension management Norvasc. Does not have primary care physician has not had colonoscopy he said he did have his prostate examined  Review of systems positive for seasonal allergies and musculoskeletal complaints as described otherwise his review of systems was normal. No allergies. No previous surgery. Family history of arthritis and diabetes  He does drink a pint a week, college degree noted as well  Vital signs: BP 145/97  Ht 6\' 6"  (1.981 m)  Wt 253 lb (114.76 kg)  BMI 29.24 kg/m2  General the patient is well-developed and well-nourished grooming and hygiene are normal Oriented x3 Mood and affect normal Ambulation supported by a cane Inspection of the right knee large joint effusion diffuse tenderness knee flexion 125 knee stable slight varus motor exam normal skin intact pulses good normal sensation distally Full range of motion All joints are stable Motor exam is normal Skin clean dry and intact  Cardiovascular exam is normal Sensory exam normal  Encounter Diagnoses  Name Primary?  Marland Kitchen Arthritis Yes  . Effusion of knee joint   . Osteoarthrosis, unspecified whether generalized or localized, lower leg     Recommend aspiration of knee we aspirated 70 cc of yellow fluid we injected cortisone  Knee  Injection Procedure Note  Pre-operative Diagnosis: right  knee oa  Post-operative Diagnosis: same  Indications: pain  Anesthesia: ethyl chloride   Procedure Details   Verbal consent was  obtained for the procedure. Time out was completed.The joint was prepped with alcohol, followed by  Ethyl chloride spray and An 18gauge needle was inserted into the knee via superior-lateral approach; 70 cc of fluid  was aspirated, followed by  Injection of 45ml 1% lidocaine and 1 ml of depomedrol.  The needle was removed and the area cleansed and dressed.  Complications:  None; patient tolerated the procedure well.  Meds ordered this encounter  Medications  . nabumetone (RELAFEN) 500 MG tablet    Sig: Take 1 tablet (500 mg total) by mouth 2 (two) times daily.    Dispense:  60 tablet    Refill:  5   Return 1 month

## 2013-06-06 ENCOUNTER — Other Ambulatory Visit: Payer: Self-pay | Admitting: *Deleted

## 2013-06-06 ENCOUNTER — Telehealth: Payer: Self-pay | Admitting: *Deleted

## 2013-06-06 DIAGNOSIS — Z0000 Encounter for general adult medical examination without abnormal findings: Secondary | ICD-10-CM

## 2013-06-06 NOTE — Telephone Encounter (Signed)
Patient is scheduled to see Laban Emperor, PA, with Marylee Floras on 07/08/13 for consult colonoscopy. Patient aware.

## 2013-06-24 ENCOUNTER — Telehealth: Payer: Self-pay | Admitting: *Deleted

## 2013-06-24 NOTE — Telephone Encounter (Signed)
Pt called wanting to schedule his colonoscopy. Please advise [phone]

## 2013-06-24 NOTE — Telephone Encounter (Signed)
I called pt. See separate triage when complete.

## 2013-07-01 NOTE — Telephone Encounter (Signed)
Gastroenterology Pre-Procedure Review  Request Date: 06/24/2013 Requesting Physician: [facility]  FIRST COLONOSCOPY/ PT SAID HE DRINKS ABOUT 1/5 OR MORE ALCOHOL PER WEEK PLEASE ADVISE IF NEEDS OV NOT SCHEDULED YET  PATIENT REVIEW QUESTIONS: The patient responded to the following health history questions as indicated:    1. Diabetes Melitis: no 2. Joint replacements in the past 12 months: no 3. Major health problems in the past 3 months: no 4. Has an artificial valve or MVP: no 5. Has a defibrillator: no 6. Has been advised in past to take antibiotics in advance of a procedure like teeth cleaning: no     MEDICATIONS & ALLERGIES:    Patient reports the following regarding taking any blood thinners:   Plavix? no Aspirin? YES Coumadin? no  Patient confirms/reports the following medications:  Current Outpatient Prescriptions  Medication Sig Dispense Refill  . amLODipine (NORVASC) 10 MG tablet TAKE 1 TABLET (10 MG TOTAL) BY MOUTH DAILY.  30 tablet  9  . aspirin 81 MG EC tablet Take 81 mg by mouth daily.        . diphenhydrAMINE (BENADRYL) 25 mg capsule Take 25 mg by mouth every 6 (six) hours as needed for itching or allergies.      . Multiple Vitamin (MULTIVITAMIN) tablet Take 1 tablet by mouth daily.        . nabumetone (RELAFEN) 500 MG tablet Take 1 tablet (500 mg total) by mouth 2 (two) times daily.  60 tablet  5   No current facility-administered medications for this visit.    Patient confirms/reports the following allergies:  No Known Allergies  No orders of the defined types were placed in this encounter.    AUTHORIZATION INFORMATION Primary Insurance:   ID #:   Group #:  Pre-Cert / Auth required: Pre-Cert / Auth #:   Secondary Insurance:   ID #:   Group #:  Pre-Cert / Auth required: Pre-Cert / Auth #:   SCHEDULE INFORMATION: Procedure has been scheduled as follows:  Date:                   Time:   Location:   This Gastroenterology Pre-Precedure  Review Form is being routed to the following provider(s): Barney Drain, MD

## 2013-07-01 NOTE — Telephone Encounter (Signed)
MOVI PREP SPLIT DOSING, REGULAR BREAKFAST. CLEAR LIQUIDS AFTER 9 AM.  Phenergan 12.5 mg iv in preop.

## 2013-07-07 NOTE — Telephone Encounter (Signed)
T/C from [place]. She said pt will get his message on his cell and give me a return call. I made her aware that he does not have to come in for the OV tomorrow, I have cancelled that , but to please call to schedule the colonoscopy.

## 2013-07-07 NOTE — Telephone Encounter (Signed)
I called pt and LMOM that Dr. Oneida Alar has signed off on his colonoscopy and he does not need to come for OV tomorrow. However, he does need to call so I can give appt for the colonoscopy.

## 2013-07-08 ENCOUNTER — Encounter: Payer: Self-pay | Admitting: Orthopedic Surgery

## 2013-07-08 ENCOUNTER — Ambulatory Visit (INDEPENDENT_AMBULATORY_CARE_PROVIDER_SITE_OTHER): Payer: BC Managed Care – PPO | Admitting: Orthopedic Surgery

## 2013-07-08 ENCOUNTER — Ambulatory Visit: Payer: BC Managed Care – PPO | Admitting: Gastroenterology

## 2013-07-08 VITALS — BP 124/79 | Ht 78.0 in | Wt 253.0 lb

## 2013-07-08 DIAGNOSIS — M549 Dorsalgia, unspecified: Secondary | ICD-10-CM | POA: Insufficient documentation

## 2013-07-08 DIAGNOSIS — M25469 Effusion, unspecified knee: Secondary | ICD-10-CM

## 2013-07-08 MED ORDER — PREDNISONE (PAK) 10 MG PO TABS: ORAL_TABLET | ORAL | Status: DC

## 2013-07-08 MED ORDER — METHOCARBAMOL 500 MG PO TABS: 500.0000 mg | ORAL_TABLET | Freq: Four times a day (QID) | ORAL | Status: DC

## 2013-07-08 NOTE — Patient Instructions (Signed)
Meds ordered this encounter  Medications  . predniSONE (STERAPRED UNI-PAK) 10 MG tablet    Sig: 10 mg dose pack 12 days as directed    Dispense:  48 tablet    Refill:  1  . methocarbamol (ROBAXIN) 500 MG tablet    Sig: Take 1 tablet (500 mg total) by mouth 4 (four) times daily.    Dispense:  60 tablet    Refill:  1

## 2013-07-08 NOTE — Progress Notes (Signed)
Patient ID: Nathaniel Kline, male   DOB: 1950/11/11, 63 y.o.   MRN: [id number]  Chief Complaint  Patient presents with  . Follow-up    1 month recheck on right knee status post injection with aspiration.   Secondary complaint right lower back pain started a week ago because of the missed a couple days work. Moderate in severity seems to be constantly to walking on concrete not associated with numbness or tingling  Right knee pain improved but swelling has returned  Review of systems neurologic symptoms as stated. Musculoskeletal symptoms joint pain stiffness in the knee no warmth mild muscle pain lower back  Past Medical History  Diagnosis Date  . Alcohol abuse   . HLD (hyperlipidemia)   . HTN (hypertension)     Vital signs:  BP 124/79  Ht 6\' 6"  (1.981 m)  Wt 253 lb (114.76 kg)  BMI 29.24 kg/m2   General the patient is well-developed and well-nourished grooming and hygiene are normal Oriented x3 Mood and affect normal Ambulation gait is normal Inspection of the swelling of the knee not as much as before painful but therefore range of motion ligaments stable motor normal skin intact McMurray sign negative mild diffuse joint pain and tenderness  Cardiovascular exam is normal Sensory exam normal  Encounter Diagnoses  Name Primary?  . Back pain   . Effusion of knee joint Yes   Aspiration RIGHT  knee.  Verbal consent was obtained, timeout was completed.  Sterile prep of the RIGHT  knee.  Lateral approach, suprapatellar  Aspiration of 30 cc of clear, yellow fluid.  Injection of Depo-Medrol 40 mg per cc, 1 cc, and 4 cc 1% plain lidocaine.  No complications.  Meds ordered this encounter  Medications  . predniSONE (STERAPRED UNI-PAK) 10 MG tablet    Sig: 10 mg dose pack 12 days as directed    Dispense:  48 tablet    Refill:  1  . methocarbamol (ROBAXIN) 500 MG tablet    Sig: Take 1 tablet (500 mg total) by mouth 4 (four) times daily.    Dispense:  60 tablet    Refill:   1   6 weeks re ck

## 2013-07-09 NOTE — Telephone Encounter (Signed)
Letter mailed to pt to call and schedule the colonoscopy.

## 2013-07-23 ENCOUNTER — Telehealth: Payer: Self-pay | Admitting: *Deleted

## 2013-07-23 NOTE — Telephone Encounter (Signed)
Pt called to schedule his colonoscopy. Pt would like for it to be on a Friday evening. Please advise [phone]

## 2013-07-28 NOTE — Telephone Encounter (Signed)
I called pt. He is at work and he will try to call me about 2:00 PM today to be triaged.

## 2013-08-12 NOTE — Telephone Encounter (Signed)
Letter to pt to call.

## 2013-08-19 ENCOUNTER — Ambulatory Visit: Payer: BC Managed Care – PPO | Admitting: Orthopedic Surgery

## 2013-08-19 ENCOUNTER — Encounter: Payer: Self-pay | Admitting: Orthopedic Surgery

## 2013-11-18 ENCOUNTER — Telehealth: Payer: Self-pay

## 2013-11-18 NOTE — Telephone Encounter (Signed)
PATIENT CAME IN TO SCHEDULE HIS COLONOSCOPY  PLEASE CALL HIM AT [phone]

## 2013-11-19 ENCOUNTER — Telehealth: Payer: Self-pay

## 2013-11-20 ENCOUNTER — Other Ambulatory Visit: Payer: Self-pay

## 2013-11-20 DIAGNOSIS — Z1211 Encounter for screening for malignant neoplasm of colon: Secondary | ICD-10-CM

## 2013-11-20 NOTE — Telephone Encounter (Addendum)
Gastroenterology Pre-Procedure Review  Request Date: 12/04/2013 Requesting Physician: [facility]  PATIENT REVIEW QUESTIONS: The patient responded to the following health history questions as indicated:    Pt says he drinks about 1/5 of alcohol weekly  1. Diabetes Melitis: no 2. Joint replacements in the past 12 months: no 3. Major health problems in the past 3 months: no 4. Has an artificial valve or MVP: no 5. Has a defibrillator: no 6. Has been advised in past to take antibiotics in advance of a procedure like teeth cleaning: no    MEDICATIONS & ALLERGIES:    Patient reports the following regarding taking any blood thinners:  ASA  YES  Plavix? NO Coumadin? no  Patient confirms/reports the following medications:  Current Outpatient Prescriptions  Medication Sig Dispense Refill  . amLODipine (NORVASC) 10 MG tablet TAKE 1 TABLET (10 MG TOTAL) BY MOUTH DAILY.  30 tablet  9  . aspirin 81 MG EC tablet Take 81 mg by mouth daily.        . diphenhydrAMINE (BENADRYL) 25 mg capsule Take 25 mg by mouth every 6 (six) hours as needed for itching or allergies.      . Multiple Vitamin (MULTIVITAMIN) tablet Take 1 tablet by mouth daily.        . methocarbamol (ROBAXIN) 500 MG tablet Take 1 tablet (500 mg total) by mouth 4 (four) times daily.  60 tablet  1  . nabumetone (RELAFEN) 500 MG tablet Take 1 tablet (500 mg total) by mouth 2 (two) times daily.  60 tablet  5  . predniSONE (STERAPRED UNI-PAK) 10 MG tablet 10 mg dose pack 12 days as directed  48 tablet  1   No current facility-administered medications for this visit.    Patient confirms/reports the following allergies:  No Known Allergies  No orders of the defined types were placed in this encounter.    AUTHORIZATION INFORMATION Primary Insurance:   ID #:  Group #:  Pre-Cert / Auth required:  Pre-Cert / Auth #:   Secondary Insurance:   ID #:   Group #:  Pre-Cert / Auth required: Pre-Cert / Auth #:   SCHEDULE  INFORMATION: Procedure has been scheduled as follows:  Date: 12/04/2013       Time: 1:00 PM  Location: [facility] Short Stay  This Gastroenterology Pre-Precedure Review Form is being routed to the following provider(s): Barney Drain, MD

## 2013-11-20 NOTE — Telephone Encounter (Signed)
See separate triage.  

## 2013-11-21 ENCOUNTER — Encounter (HOSPITAL_COMMUNITY): Payer: Self-pay | Admitting: Pharmacy Technician

## 2013-11-24 NOTE — Telephone Encounter (Signed)
MOVI PREP SPLIT DOSING- FULL LIQUIDS WITH BREAKFAST.  Full Liquid Diet A high-calorie, high-protein supplement should be used to meet your nutritional requirements when the full liquid diet is continued for more than 2 or 3 days. If this diet is to be used for an extended period of time (more than 7 days), a multivitamin should be considered.  Breads and Starches  Allowed: None are allowed except crackers WHOLE OR pureed (made into a thick, smooth soup) in soup.   Avoid: Any others.    Potatoes/Pasta/Rice  Allowed: ANY ITEM AS A SOUP OR SMALL PLATE OF MASHED POTATOES.      Vegetables  Allowed: Strained tomato or vegetable juice. Vegetables pureed in soup.   Avoid: Any others.    Fruit  Allowed: Any strained fruit juices and fruit drinks. Include 1 serving of citrus or vitamin C-enriched fruit juice daily.   Avoid: Any others.  Meat and Meat Substitutes  Allowed: Egg  Avoid: Any meat, fish, or fowl. All cheese.  Milk  Allowed: Milk beverages, including milk shakes and instant breakfast mixes. Smooth yogurt.   Avoid: Any others. Avoid dairy products if not tolerated.    Soups and Combination Foods  Allowed: Broth, strained cream soups. Strained, broth-based soups.   Avoid: Any others.    Desserts and Sweets  Allowed: flavored gelatin, tapioca, plain ice cream, sherbet, smooth pudding, junket, fruit ices, frozen ice pops, pudding pops,, frozen fudge pops, chocolate syrup. Sugar, honey, jelly, syrup.   Avoid: Any others.  Fats and Oils  Allowed: Margarine, butter, cream, sour cream, oils.   Avoid: Any others.  Beverages  Allowed: All.   Avoid: None.  Condiments  Allowed: Iodized salt, pepper, spices, flavorings. Cocoa powder.   Avoid: Any others.    SAMPLE MEAL PLAN Breakfast   cup orange juice.   1 OR 2 EGGS  1 cup  milk.   1 cup beverage (coffee or tea).   Cream or sugar, if desired.    Midmorning Snack  2 SCRAMBLED OR HARD BOILED  EGG   Lunch  1 cup cream soup.    cup fruit juice.   1 cup milk.    cup custard.   1 cup beverage (coffee or tea).   Cream or sugar, if desired.    Midafternoon Snack  1 cup milk shake.  Dinner  1 cup cream soup.    cup fruit juice.   1 cup milk.    cup pudding.   1 cup beverage (coffee or tea).   Cream or sugar, if desired.  Evening Snack  1 cup supplement.  To increase calories, add sugar, cream, butter, or margarine if possible. Nutritional supplements will also increase the total calories.

## 2013-11-25 MED ORDER — PEG-KCL-NACL-NASULF-NA ASC-C 100 G PO SOLR: 1.0000 | ORAL | Status: DC

## 2013-11-25 NOTE — Telephone Encounter (Signed)
Rx sent to the pharmacy and instructions mailed to pt.  

## 2013-12-02 ENCOUNTER — Telehealth: Payer: Self-pay

## 2013-12-02 NOTE — Telephone Encounter (Signed)
REVIEWED-NO ADDITIONAL RECOMMENDATIONS. 

## 2013-12-02 NOTE — Telephone Encounter (Signed)
Pt was taken off of out schedule and Hoyle Sauer is aware in Endo.

## 2013-12-02 NOTE — Telephone Encounter (Signed)
Pt called and cancelled his colonoscopy on 12/04/2013. Michela Pitcher he is having some other health issues and he will just see how that goes and call back when he is ready to schedule.

## 2013-12-03 ENCOUNTER — Other Ambulatory Visit: Payer: Self-pay | Admitting: Adult Health

## 2013-12-04 ENCOUNTER — Encounter (HOSPITAL_COMMUNITY): Admission: RE | Payer: Self-pay | Source: Ambulatory Visit

## 2013-12-04 ENCOUNTER — Ambulatory Visit (HOSPITAL_COMMUNITY)
Admission: RE | Admit: 2013-12-04 | Payer: BC Managed Care – PPO | Source: Ambulatory Visit | Admitting: Gastroenterology

## 2013-12-04 SURGERY — COLONOSCOPY
Anesthesia: Moderate Sedation

## 2013-12-29 ENCOUNTER — Other Ambulatory Visit: Payer: Self-pay | Admitting: Adult Health

## 2014-01-17 ENCOUNTER — Other Ambulatory Visit: Payer: Self-pay | Admitting: Adult Health

## 2014-01-26 ENCOUNTER — Telehealth: Payer: Self-pay

## 2014-01-26 ENCOUNTER — Other Ambulatory Visit: Payer: Self-pay

## 2014-01-26 DIAGNOSIS — Z1211 Encounter for screening for malignant neoplasm of colon: Secondary | ICD-10-CM

## 2014-01-26 NOTE — Telephone Encounter (Signed)
Patient here to schedule colonoscopy

## 2014-01-26 NOTE — Telephone Encounter (Signed)
Pt is scheduled for Jan 2016.

## 2014-01-27 NOTE — Telephone Encounter (Signed)
Gastroenterology Pre-Procedure Review  Request Date: 01/26/2014 Requesting Physician: [facility]  PATIENT REVIEW QUESTIONS: The patient responded to the following health history questions as indicated:    Pt said he drinks a couple of shots of beer daily Previously scheduled for colonoscopy and cancelled  1. Diabetes Melitis: no 2. Joint replacements in the past 12 months: no 3. Major health problems in the past 3 months: no 4. Has an artificial valve or MVP: no 5. Has a defibrillator: no 6. Has been advised in past to take antibiotics in advance of a procedure like teeth cleaning: no    MEDICATIONS & ALLERGIES:    Patient reports the following regarding taking any blood thinners:   Plavix? no Aspirin? no Coumadin? no  Patient confirms/reports the following medications:  Current Outpatient Prescriptions  Medication Sig Dispense Refill  . amLODipine (NORVASC) 10 MG tablet TAKE 1 TABLET (10 MG TOTAL) BY MOUTH DAILY. 15 tablet 0  . aspirin 81 MG EC tablet Take 81 mg by mouth daily.      . diphenhydrAMINE (BENADRYL) 25 mg capsule Take 25 mg by mouth every 6 (six) hours as needed for itching or allergies.    Marland Kitchen ibuprofen (ADVIL,MOTRIN) 200 MG tablet Take 400 mg by mouth daily as needed for moderate pain.    . magnesium 30 MG tablet Take 30 mg by mouth 2 (two) times daily.    . Multiple Vitamin (MULTIVITAMIN) tablet Take 1 tablet by mouth daily.      . vitamin C (ASCORBIC ACID) 500 MG tablet Take 500 mg by mouth daily.    . peg 3350 powder (MOVIPREP) 100 G SOLR Take 1 kit (200 g total) by mouth as directed. 1 kit 0   No current facility-administered medications for this visit.    Patient confirms/reports the following allergies:  No Known Allergies  No orders of the defined types were placed in this encounter.    AUTHORIZATION INFORMATION Primary Insurance:   ID #:   Group #:  Pre-Cert / Auth required: Pre-Cert / Auth #:   Secondary Insurance:   ID #:   Group #:   Pre-Cert / Auth required:  Pre-Cert / Auth #:   SCHEDULE INFORMATION: Procedure has been scheduled as follows:  Date: 03/02/2014            Time: 9:30 Am  Location: [facility] Short Stay  This Gastroenterology Pre-Precedure Review Form is being routed to the following provider(s): Barney Drain, MD

## 2014-01-30 ENCOUNTER — Other Ambulatory Visit: Payer: Self-pay | Admitting: Adult Health

## 2014-02-04 ENCOUNTER — Other Ambulatory Visit: Payer: Self-pay | Admitting: Adult Health

## 2014-02-04 NOTE — Telephone Encounter (Signed)
MOVI PREP SPLIT DOSING, REGULAR BREAKFAST. CLEAR LIQUIDS AFTER 9 AM.  

## 2014-02-05 ENCOUNTER — Telehealth: Payer: Self-pay

## 2014-02-05 ENCOUNTER — Other Ambulatory Visit: Payer: Self-pay

## 2014-02-05 NOTE — Telephone Encounter (Signed)
No pre cert needed for Colonoscopy spoke with DJ at [facility]

## 2014-02-05 NOTE — Telephone Encounter (Signed)
Pt has been rescheduled to 03/13/2014. Prep instructions have been mailed.

## 2014-02-24 ENCOUNTER — Telehealth: Payer: Self-pay

## 2014-02-24 NOTE — Telephone Encounter (Signed)
Pt's instructions for colonoscopy were returned. I called him and he will pick them up at the office this afternoon. I am leaving them at front for him.  He also said there have been no change in his meds since he was triaged.

## 2014-02-25 NOTE — Telephone Encounter (Signed)
REVIEWED-NO ADDITIONAL RECOMMENDATIONS. 

## 2014-03-10 ENCOUNTER — Telehealth: Payer: Self-pay

## 2014-03-10 MED ORDER — PEG 3350-KCL-NA BICARB-NACL 420 G PO SOLR: 4000.0000 mL | ORAL | Status: DC

## 2014-03-10 NOTE — Telephone Encounter (Signed)
Pt called to reschedule colonoscopy from 03/13/2014 to 04/07/2014 at 10:30 AM with Dr. Oneida Alar.  I have LMOM for Kim in endo and mailing pt new instructions. ( He also requested a cheaper prep).  Sending in [place].

## 2014-03-10 NOTE — Telephone Encounter (Signed)
REVIEWED-NO ADDITIONAL RECOMMENDATIONS. 

## 2014-03-11 ENCOUNTER — Encounter: Payer: Self-pay | Admitting: Cardiology

## 2014-03-11 ENCOUNTER — Ambulatory Visit (INDEPENDENT_AMBULATORY_CARE_PROVIDER_SITE_OTHER): Payer: BLUE CROSS/BLUE SHIELD | Admitting: Cardiology

## 2014-03-11 VITALS — BP 130/96 | HR 82 | Ht 78.0 in | Wt 271.0 lb

## 2014-03-11 DIAGNOSIS — Z136 Encounter for screening for cardiovascular disorders: Secondary | ICD-10-CM

## 2014-03-11 DIAGNOSIS — I10 Essential (primary) hypertension: Secondary | ICD-10-CM

## 2014-03-11 DIAGNOSIS — I2510 Atherosclerotic heart disease of native coronary artery without angina pectoris: Secondary | ICD-10-CM

## 2014-03-11 NOTE — Patient Instructions (Signed)
Your physician wants you to follow-up in: 1 year. You will receive a reminder letter in the mail two months in advance. If you don't receive a letter, please call our office to schedule the follow-up appointment.  Your physician recommends that you continue on your current medications as directed. Please refer to the Current Medication list given to you today.  Your physician recommends that you return for lab work Fasting  You have been given a list of PCP in this area.  Thank you for choosing [facility]!

## 2014-03-11 NOTE — Progress Notes (Signed)
Clinical Summary Mr. Boys is a 64 y.o.male last seen by NP Purcell Nails, this is our first visit together. He is seen for the following medical problems.  1. 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 2-3/10 severity, typically with activity. No other symptoms. Lasts just 1-2 seconds. Occurs 2 times a month.  - denies any SOB/DOE, though has had some recent allergies and congestion.  - compliant with meds  2. HTN - checks at times, typically around 130s/90s - compliant with meds  3. Dizzy spells - started approx 6 weeks ago. + headaches, sinus congestion. Symptoms occur mainly with standing. Feels lightheaded, lasts for few minutes. No palpitations.  - reports normal oral intake.   Past Medical History  Diagnosis Date  . Alcohol abuse   . HLD (hyperlipidemia)   . HTN (hypertension)      No Known Allergies   Current Outpatient Prescriptions  Medication Sig Dispense Refill  . amLODipine (NORVASC) 10 MG tablet TAKE 1 TABLET (10 MG TOTAL) BY MOUTH DAILY. 15 tablet 0  . amLODipine (NORVASC) 10 MG tablet TAKE 1 TABLET (10 MG TOTAL) BY MOUTH DAILY. 30 tablet 3  . aspirin 81 MG EC tablet Take 81 mg by mouth daily.      . diphenhydrAMINE (BENADRYL) 25 mg capsule Take 25 mg by mouth every 6 (six) hours as needed for itching or allergies.    Marland Kitchen ibuprofen (ADVIL,MOTRIN) 200 MG tablet Take 400 mg by mouth daily as needed for moderate pain.    . magnesium 30 MG tablet Take 30 mg by mouth 2 (two) times daily.    . Multiple Vitamin (MULTIVITAMIN) tablet Take 1 tablet by mouth daily.      . peg 3350 powder (MOVIPREP) 100 G SOLR Take 1 kit (200 g total) by mouth as directed. 1 kit 0  . polyethylene glycol-electrolytes (TRILYTE) 420 G solution Take 4,000 mLs by mouth as directed. 4000 mL 0  . vitamin C (ASCORBIC ACID) 500 MG tablet Take 500 mg by mouth daily.     No current facility-administered  medications for this visit.     Past Surgical History  Procedure Laterality Date  . 2 caths  10/21/05    caridac cath     No Known Allergies    Family History  Problem Relation Age of Onset  . Hypertension      family hx     Social History Mr. Mehlhoff reports that he has quit smoking. He has never used smokeless tobacco. Mr. Gripp reports that he drinks alcohol.   Review of Systems CONSTITUTIONAL: No weight loss, fever, chills, weakness or fatigue.  HEENT: +congestion SKIN: No rash or itching.  CARDIOVASCULAR: per HPI RESPIRATORY: No shortness of breath, cough or sputum.  GASTROINTESTINAL: No anorexia, nausea, vomiting or diarrhea. No abdominal pain or blood.  GENITOURINARY: No burning on urination, no polyuria NEUROLOGICAL: +dizziness MUSCULOSKELETAL: No muscle, back pain, joint pain or stiffness.  LYMPHATICS: No enlarged nodes. No history of splenectomy.  PSYCHIATRIC: No history of depression or anxiety.  ENDOCRINOLOGIC: No reports of sweating, cold or heat intolerance. No polyuria or polydipsia.  Marland Kitchen   Physical Examination p 82 bp 130/96 Wt 271 lbs BMI 31 Gen: resting comfortably, no acute distress HEENT: no scleral icterus, pupils equal round and reactive, no palptable cervical adenopathy,  CV: RRR, no m/r/g, no JVD, no carotid bruits Resp: Clear to auscultation bilaterally GI: abdomen is  soft, non-tender, non-distended, normal bowel sounds, no hepatosplenomegaly MSK: extremities are warm, no edema.  Skin: warm, no rash Neuro:  no focal deficits Psych: appropriate affect   Diagnostic Studies 10/2005 Cath Central aortic pressure 136/96 with a mean of 115. LV pressure is 129/80 with LVEDP of 11. There was no aortic stenosis.  Left main was normal.  LAD was a long vessel wrapping the apex. It gave off a large branching first diagonal and a small second diagonal. There was a 40% lesion in the mid-LAD spanning the takeoff of the first diagonal. This  was mildly hazy in one view. There was also 40% lesion in the proximal portion of the diagonal.  Left circumflex was a moderate-size vessel made up of a large OM1 and a small OM2. It was angiographically normal.  The right coronary artery was a very large dominant vessel. It gave off a RV Tjay Velazquez, large branching PDA and two normal-size posterolaterals.  Left ventriculogram done in the RAO position showed an EF of 55% with distal anterior wall dyssynchrony. There was no mitral regurgitation. Left ventriculogram done in the LAO position showed an EF of 55-60% with normal septal and lateral wall motion.  ASSESSMENT: 1. Moderate nonobstructive coronary artery disease with a questionable  hazy lesion in the mid-left anterior descending artery. 2. Normal left ventricular function with distal anterior wall dyssynchrony  on the RAO left ventriculogram. The LAO ventriculogram was normal. 3. Abnormal EKG with stuttering chest pain suggestive of possible acute  coronary syndrome.  PLAN/DISCUSSION: The lesion in his LAD does not look obstructive but given his symptoms and abnormal ECG, I do think it is reasonable to proceed with IVUS of the LAD to evaluate for thrombus or underlying significant filling defect. This will be done by Dr. Burt Knack. He will need aggressive risk factor modification as well as control of his blood pressure.  FINDINGS: The distal LAD where we began pullback was normal. There was mild eccentric plaque distal to the area of interest. As we approached the area of interest there was concentric plaquing with very mild calcification present; there was actually a large atheroma burden. However, the luminal area remained nonobstructed throughout. The lumen at the tightest area of stenosis had an area of 6.4 square millimeters. The proximal LAD had very mild concentric plaquing and no other disease was  noted.  ASSESSMENT: Moderate atherosclerotic concentric plaquing involving the mid left anterior descending. There was no thrombus visualized.  PLAN: Medical therapy with aggressive risk factor modification.     Assessment and Plan  1. Non-obstructive CAD - mild episodes of noncardiac chest pain, otherwise no symptoms - continue risk factor modification  2. HTN - at goal, continue current meds  3. Dizziness - associated with sinus congestion and headache, likely related. No palpitaitons. - continue prn anti-histamines, recommended nasal steroids  F/u 1 year      Arnoldo Lenis, M.D.

## 2014-04-07 ENCOUNTER — Ambulatory Visit (HOSPITAL_COMMUNITY)
Admission: RE | Admit: 2014-04-07 | Payer: BLUE CROSS/BLUE SHIELD | Source: Ambulatory Visit | Admitting: Gastroenterology

## 2014-04-07 ENCOUNTER — Encounter (HOSPITAL_COMMUNITY): Admission: RE | Payer: Self-pay | Source: Ambulatory Visit

## 2014-04-07 SURGERY — COLONOSCOPY
Anesthesia: Moderate Sedation

## 2014-04-08 ENCOUNTER — Other Ambulatory Visit: Payer: Self-pay

## 2014-04-08 ENCOUNTER — Telehealth: Payer: Self-pay

## 2014-04-08 DIAGNOSIS — Z1211 Encounter for screening for malignant neoplasm of colon: Secondary | ICD-10-CM

## 2014-04-08 NOTE — Telephone Encounter (Signed)
Pt has been rescheduled to 05/08/2014 at 12:30 PM with Dr. Oneida Alar for his colonoscopy.  He still has his Trilyte and I will mail him new instructions.

## 2014-05-07 ENCOUNTER — Telehealth: Payer: Self-pay

## 2014-05-07 NOTE — Telephone Encounter (Signed)
PLEASE CALL PT. HE HAS CANCELED 5 TCS APPT. WE WILL NOT BE ABLE TO [facility] HIM. HE MAY TRY ANOTHER GASTROENTEROLOGY [facility] TO COMPLETE HIS COLON CANCER SCREENING.

## 2014-05-07 NOTE — Telephone Encounter (Signed)
Dr. Oneida Alar, Ginger said she received a phone call from the hospital that this pt wanted to reschedule his colonoscopy. He was scheduled for tomorrow, 05/08/2014. This was 5 appointments that he has had for colonoscopy. He was scheduled first for 12/05/2014, 03/02/2014, 03/13/2014, 04/07/2014 and then 05/08/2014. Please advise if I need to reschedule.

## 2014-05-08 ENCOUNTER — Encounter (HOSPITAL_COMMUNITY): Admission: RE | Payer: Self-pay | Source: Ambulatory Visit

## 2014-05-08 ENCOUNTER — Ambulatory Visit (HOSPITAL_COMMUNITY)
Admission: RE | Admit: 2014-05-08 | Payer: BLUE CROSS/BLUE SHIELD | Source: Ambulatory Visit | Admitting: Gastroenterology

## 2014-05-08 SURGERY — COLONOSCOPY
Anesthesia: Moderate Sedation

## 2014-05-11 NOTE — Telephone Encounter (Signed)
Letter mailed to pt.  

## 2014-05-22 ENCOUNTER — Other Ambulatory Visit: Payer: Self-pay | Admitting: Cardiology

## 2014-12-15 ENCOUNTER — Other Ambulatory Visit: Payer: Self-pay | Admitting: Cardiology

## 2015-03-26 ENCOUNTER — Ambulatory Visit (INDEPENDENT_AMBULATORY_CARE_PROVIDER_SITE_OTHER): Payer: BLUE CROSS/BLUE SHIELD | Admitting: Cardiology

## 2015-03-26 ENCOUNTER — Encounter: Payer: Self-pay | Admitting: Cardiology

## 2015-03-26 VITALS — BP 140/88 | HR 94 | Ht 78.0 in | Wt 269.0 lb

## 2015-03-26 DIAGNOSIS — I10 Essential (primary) hypertension: Secondary | ICD-10-CM

## 2015-03-26 DIAGNOSIS — I2510 Atherosclerotic heart disease of native coronary artery without angina pectoris: Secondary | ICD-10-CM

## 2015-03-26 NOTE — Progress Notes (Signed)
Patient ID: Nathaniel Kline, male   DOB: 07-02-1950, 65 y.o.   MRN: [id number]     Clinical Summary Nathaniel Kline is a 65 y.o.male seen today for follow up of the following medical problems.   1. 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 Aching pain that has been chronic and is unchanged. No SOB or DOE.  - compliant with meds   2. HTN - typically around 130s/80s at home - compliant with meds     Past Medical History  Diagnosis Date  . Alcohol abuse   . HLD (hyperlipidemia)   . HTN (hypertension)      No Known Allergies   Current Outpatient Prescriptions  Medication Sig Dispense Refill  . amLODipine (NORVASC) 10 MG tablet TAKE 1 TABLET BY MOUTH EVERY DAY 90 tablet 3  . aspirin 81 MG EC tablet Take 81 mg by mouth daily.      . diphenhydrAMINE (BENADRYL) 25 mg capsule Take 25 mg by mouth every 6 (six) hours as needed for itching or allergies.    Marland Kitchen GARLIC PO Take 1 tablet by mouth daily.    Marland Kitchen ibuprofen (ADVIL,MOTRIN) 200 MG tablet Take 400 mg by mouth daily as needed for moderate pain.    . magnesium 30 MG tablet Take 30 mg by mouth 2 (two) times daily.    . Multiple Vitamin (MULTIVITAMIN) tablet Take 1 tablet by mouth daily.      . peg 3350 powder (MOVIPREP) 100 G SOLR Take 1 kit (200 g total) by mouth as directed. (Patient not taking: Reported on 04/24/2014) 1 kit 0  . polyethylene glycol-electrolytes (TRILYTE) 420 G solution Take 4,000 mLs by mouth as directed. 4000 mL 0  . vitamin C (ASCORBIC ACID) 500 MG tablet Take 500 mg by mouth daily.     No current facility-administered medications for this visit.     Past Surgical History  Procedure Laterality Date  . 2 caths  10/21/05    caridac cath     No Known Allergies    Family History  Problem Relation Age of Onset  . Hypertension      family hx     Social History Nathaniel Kline reports that he quit smoking about 21 years  ago. His smoking use included Cigarettes. He started smoking about 40 years ago. He has a 9.5 pack-year smoking history. He has never used smokeless tobacco. Nathaniel Kline reports that he drinks about 6.6 oz of alcohol per week.   Review of Systems CONSTITUTIONAL: No weight loss, fever, chills, weakness or fatigue.  HEENT: Eyes: No visual loss, blurred vision, double vision or yellow sclerae.No hearing loss, sneezing, congestion, runny nose or sore throat.  SKIN: No rash or itching.  CARDIOVASCULAR: per HPI RESPIRATORY: No shortness of breath, cough or sputum.  GASTROINTESTINAL: No anorexia, nausea, vomiting or diarrhea. No abdominal pain or blood.  GENITOURINARY: No burning on urination, no polyuria NEUROLOGICAL: No headache, dizziness, syncope, paralysis, ataxia, numbness or tingling in the extremities. No change in bowel or bladder control.  MUSCULOSKELETAL: No muscle, back pain, joint pain or stiffness.  LYMPHATICS: No enlarged nodes. No history of splenectomy.  PSYCHIATRIC: No history of depression or anxiety.  ENDOCRINOLOGIC: No reports of sweating, cold or heat intolerance. No polyuria or polydipsia.  Marland Kitchen   Physical Examination Filed Vitals:   03/26/15 1014  BP: 140/88  Pulse: 94   Filed Vitals:   03/26/15  1014  Height: '6\' 6"'  (1.981 m)  Weight: 269 lb (122.018 kg)    Gen: resting comfortably, no acute distress HEENT: no scleral icterus, pupils equal round and reactive, no palptable cervical adenopathy,  CV: RRR, no m/r/g, no jvd Resp: Clear to auscultation bilaterally GI: abdomen is soft, non-tender, non-distended, normal bowel sounds, no hepatosplenomegaly MSK: extremities are warm, no edema.  Skin: warm, no rash Neuro:  no focal deficits Psych: appropriate affect   Diagnostic Studies  10/2005 Cath Central aortic pressure 136/96 with a mean of 115. LV pressure is 129/80 with LVEDP of 11. There was no aortic stenosis.  Left main was normal.  LAD was a long  vessel wrapping the apex. It gave off a large branching first diagonal and a small second diagonal. There was a 40% lesion in the mid-LAD spanning the takeoff of the first diagonal. This was mildly hazy in one view. There was also 40% lesion in the proximal portion of the diagonal.  Left circumflex was a moderate-size vessel made up of a large OM1 and a small OM2. It was angiographically normal.  The right coronary artery was a very large dominant vessel. It gave off a RV Nathaniel Kline, large branching PDA and two normal-size posterolaterals.  Left ventriculogram done in the RAO position showed an EF of 55% with distal anterior wall dyssynchrony. There was no mitral regurgitation. Left ventriculogram done in the LAO position showed an EF of 55-60% with normal septal and lateral wall motion.  ASSESSMENT: 1. Moderate nonobstructive coronary artery disease with a questionable  hazy lesion in the mid-left anterior descending artery. 2. Normal left ventricular function with distal anterior wall dyssynchrony  on the RAO left ventriculogram. The LAO ventriculogram was normal. 3. Abnormal EKG with stuttering chest pain suggestive of possible acute  coronary syndrome.  PLAN/DISCUSSION: The lesion in his LAD does not look obstructive but given his symptoms and abnormal ECG, I do think it is reasonable to proceed with IVUS of the LAD to evaluate for thrombus or underlying significant filling defect. This will be done by Dr. Burt Knack. He will need aggressive risk factor modification as well as control of his blood pressure.  FINDINGS: The distal LAD where we began pullback was normal. There was mild eccentric plaque distal to the area of interest. As we approached the area of interest there was concentric plaquing with very mild calcification present; there was actually a large atheroma burden. However, the luminal area remained nonobstructed  throughout. The lumen at the tightest area of stenosis had an area of 6.4 square millimeters. The proximal LAD had very mild concentric plaquing and no other disease was noted.  ASSESSMENT: Moderate atherosclerotic concentric plaquing involving the mid left anterior descending. There was no thrombus visualized.  PLAN: Medical therapy with aggressive risk factor modification.   Assessment and Plan  1. Non-obstructive CAD - chronic intermittent chest pain that is unchanged, previous cath with nonobstructive disease - continue risk factor modification  2. HTN - at goal, continue current meds    F/u 1 year. He reports recent lab results he has at home and will drop off to our office      Arnoldo Lenis, M.D.

## 2015-03-26 NOTE — Patient Instructions (Signed)
Your physician wants you to follow-up in: 1 year with DR. BRANCH You will receive a reminder letter in the mail two months in advance. If you don't receive a letter, please call our office to schedule the follow-up appointment.  Your physician recommends that you continue on your current medications as directed. Please refer to the Current Medication list given to you today.  PLEASE BRING YOUR LAB WORK BY OUR OFFICE  WE HAVE GIVEN YOU A LIST OF PRIMARY CARE PHYSICIANS   Thank you for choosing [facility]!!

## 2015-12-31 ENCOUNTER — Other Ambulatory Visit: Payer: Self-pay | Admitting: Cardiology

## 2016-03-21 ENCOUNTER — Encounter: Payer: Self-pay | Admitting: *Deleted

## 2016-03-22 ENCOUNTER — Ambulatory Visit (INDEPENDENT_AMBULATORY_CARE_PROVIDER_SITE_OTHER): Payer: Self-pay | Admitting: Cardiology

## 2016-03-22 ENCOUNTER — Encounter: Payer: Self-pay | Admitting: Cardiology

## 2016-03-22 VITALS — BP 126/73 | HR 64 | Ht 78.0 in | Wt 288.4 lb

## 2016-03-22 DIAGNOSIS — E785 Hyperlipidemia, unspecified: Secondary | ICD-10-CM

## 2016-03-22 DIAGNOSIS — R0602 Shortness of breath: Secondary | ICD-10-CM

## 2016-03-22 DIAGNOSIS — E162 Hypoglycemia, unspecified: Secondary | ICD-10-CM

## 2016-03-22 DIAGNOSIS — I10 Essential (primary) hypertension: Secondary | ICD-10-CM

## 2016-03-22 DIAGNOSIS — I2510 Atherosclerotic heart disease of native coronary artery without angina pectoris: Secondary | ICD-10-CM

## 2016-03-22 MED ORDER — ATORVASTATIN CALCIUM 80 MG PO TABS: 80.0000 mg | ORAL_TABLET | Freq: Every day | ORAL | 6 refills | Status: DC

## 2016-03-22 NOTE — Patient Instructions (Signed)
Medication Instructions:   Begin Lipitor 80mg  daily.  Continue all other medications.    Labwork:  CMET, Magnesium, CBC, FLP, HgA1C, TSH  Office will contact with results via phone or letter.    Testing/Procedures: none  Follow-Up: Your physician wants you to follow up in:  1 year.  You will receive a reminder letter in the mail one-two months in advance.  If you don't receive a letter, please call our office to schedule the follow up appointment   Any Other Special Instructions Will Be Listed Below (If Applicable).  If you need a refill on your cardiac medications before your next appointment, please call your pharmacy.

## 2016-03-22 NOTE — Progress Notes (Signed)
Clinical Summary Mr. Pocock is a 66 y.o.male seen today for follow up of the following medical problems.   1. 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 Can occur at any time. Occasional SOB or DOE. Similar to his chronic pain over the last several years - walks on treadmill regularly brisk for 2.5 miles regularly without any symptoms or trouble.s   2. HTN - typically around 130s/80s at home - remains compliant with meds   3. HL - off statin due to expense according to prior notes  4. AAA screen - remote 1-2 year history of tobacco use.  Past Medical History:  Diagnosis Date  . Alcohol abuse   . HLD (hyperlipidemia)   . HTN (hypertension)      No Known Allergies   Current Outpatient Prescriptions  Medication Sig Dispense Refill  . amLODipine (NORVASC) 10 MG tablet TAKE 1 TABLET BY MOUTH EVERY DAY 90 tablet 3  . aspirin 81 MG EC tablet Take 81 mg by mouth daily.      . diphenhydrAMINE (BENADRYL) 25 mg capsule Take 25 mg by mouth every 6 (six) hours as needed for itching or allergies.    Marland Kitchen ibuprofen (ADVIL,MOTRIN) 200 MG tablet Take 400 mg by mouth daily as needed for moderate pain.    . Multiple Vitamin (MULTIVITAMIN) tablet Take 1 tablet by mouth daily.      . peg 3350 powder (MOVIPREP) 100 G SOLR Take 1 kit (200 g total) by mouth as directed. 1 kit 0  . polyethylene glycol-electrolytes (TRILYTE) 420 G solution Take 4,000 mLs by mouth as directed. 4000 mL 0  . vitamin C (ASCORBIC ACID) 500 MG tablet Take 500 mg by mouth daily.     No current facility-administered medications for this visit.      Past Surgical History:  Procedure Laterality Date  . 2 caths  10/21/05   caridac cath     No Known Allergies    Family History  Problem Relation Age of Onset  . Hypertension      family hx     Social History Mr. Bulkley reports that he quit smoking about 22 years ago.  His smoking use included Cigarettes. He started smoking about 41 years ago. He has a 9.50 pack-year smoking history. He has never used smokeless tobacco. Mr. Tucholski reports that he drinks about 6.6 oz of alcohol per week .   Review of Systems CONSTITUTIONAL: No weight loss, fever, chills, weakness or fatigue.  HEENT: Eyes: No visual loss, blurred vision, double vision or yellow sclerae.No hearing loss, sneezing, congestion, runny nose or sore throat.  SKIN: No rash or itching.  CARDIOVASCULAR: per hpi RESPIRATORY: No shortness of breath, cough or sputum.  GASTROINTESTINAL: No anorexia, nausea, vomiting or diarrhea. No abdominal pain or blood.  GENITOURINARY: No burning on urination, no polyuria NEUROLOGICAL: No headache, dizziness, syncope, paralysis, ataxia, numbness or tingling in the extremities. No change in bowel or bladder control.  MUSCULOSKELETAL: No muscle, back pain, joint pain or stiffness.  LYMPHATICS: No enlarged nodes. No history of splenectomy.  PSYCHIATRIC: No history of depression or anxiety.  ENDOCRINOLOGIC: No reports of sweating, cold or heat intolerance. No polyuria or polydipsia.  Marland Kitchen   Physical Examination Vitals:   03/22/16 0819  BP: 126/73  Pulse: 64   Vitals:   03/22/16 0819  Weight: 288 lb 6.4 oz (130.8 kg)  Height: _0  (1.981 m)  Gen: resting comfortably, no acute distress HEENT: no scleral icterus, pupils equal round and reactive, no palptable cervical adenopathy,  CV: RRR, no m/r/g, no jvd Resp: Clear to auscultation bilaterally GI: abdomen is soft, non-tender, non-distended, normal bowel sounds, no hepatosplenomegaly MSK: extremities are warm, no edema.  Skin: warm, no rash Neuro:  no focal deficits Psych: appropriate affect   Diagnostic Studies 10/2005 Cath Central aortic pressure 136/96 with a mean of 115. LV pressure is 129/80 with LVEDP of 11. There was no aortic stenosis.  Left main was normal.  LAD was a long vessel wrapping  the apex. It gave off a large branching first diagonal and a small second diagonal. There was a 40% lesion in the mid-LAD spanning the takeoff of the first diagonal. This was mildly hazy in one view. There was also 40% lesion in the proximal portion of the diagonal.  Left circumflex was a moderate-size vessel made up of a large OM1 and a small OM2. It was angiographically normal.  The right coronary artery was a very large dominant vessel. It gave off a RV Min Tunnell, large branching PDA and two normal-size posterolaterals.  Left ventriculogram done in the RAO position showed an EF of 55% with distal anterior wall dyssynchrony. There was no mitral regurgitation. Left ventriculogram done in the LAO position showed an EF of 55-60% with normal septal and lateral wall motion.  ASSESSMENT: 1. Moderate nonobstructive coronary artery disease with a questionable  hazy lesion in the mid-left anterior descending artery. 2. Normal left ventricular function with distal anterior wall dyssynchrony  on the RAO left ventriculogram. The LAO ventriculogram was normal. 3. Abnormal EKG with stuttering chest pain suggestive of possible acute  coronary syndrome.  PLAN/DISCUSSION: The lesion in his LAD does not look obstructive but given his symptoms and abnormal ECG, I do think it is reasonable to proceed with IVUS of the LAD to evaluate for thrombus or underlying significant filling defect. This will be done by Dr. Burt Knack. He will need aggressive risk factor modification as well as control of his blood pressure.  FINDINGS: The distal LAD where we began pullback was normal. There was mild eccentric plaque distal to the area of interest. As we approached the area of interest there was concentric plaquing with very mild calcification present; there was actually a large atheroma burden. However, the luminal area remained nonobstructed throughout. The  lumen at the tightest area of stenosis had an area of 6.4 square millimeters. The proximal LAD had very mild concentric plaquing and no other disease was noted.  ASSESSMENT: Moderate atherosclerotic concentric plaquing involving the mid left anterior descending. There was no thrombus visualized.  PLAN: Medical therapy with aggressive risk factor modification.    Assessment and Plan   1. Non-obstructive CAD - chronic intermittent chest pain that is unchanged, previous cath with nonobstructive disease - we will conitnue to monitor.  - EKG in clinis SR, no ischemic changes.   2. HTN - he is at goal, continue current meds  3. Hyperlipidemia - given his moderate CAD by prior cath, will start atorva 34m daily    F/u 1 year.      JArnoldo Lenis M.D.

## 2016-04-20 ENCOUNTER — Ambulatory Visit: Payer: Self-pay | Admitting: Cardiology

## 2016-08-14 DIAGNOSIS — H5200 Hypermetropia, unspecified eye: Secondary | ICD-10-CM | POA: Diagnosis not present

## 2016-12-11 DIAGNOSIS — R69 Illness, unspecified: Secondary | ICD-10-CM | POA: Diagnosis not present

## 2016-12-30 LAB — HEMOGLOBIN A1C: Hemoglobin A1C: 90

## 2017-01-11 ENCOUNTER — Other Ambulatory Visit: Payer: Self-pay | Admitting: Cardiology

## 2017-02-01 ENCOUNTER — Other Ambulatory Visit (HOSPITAL_COMMUNITY): Payer: Self-pay | Admitting: Internal Medicine

## 2017-02-01 ENCOUNTER — Ambulatory Visit (HOSPITAL_COMMUNITY)
Admission: RE | Admit: 2017-02-01 | Discharge: 2017-02-01 | Disposition: A | Discharge status: Home / Self Care | Payer: Medicare HMO | Source: Ambulatory Visit | Attending: Internal Medicine | Admitting: Internal Medicine

## 2017-02-01 DIAGNOSIS — Z13220 Encounter for screening for lipoid disorders: Secondary | ICD-10-CM | POA: Diagnosis not present

## 2017-02-01 DIAGNOSIS — R0602 Shortness of breath: Secondary | ICD-10-CM

## 2017-02-01 DIAGNOSIS — Z125 Encounter for screening for malignant neoplasm of prostate: Secondary | ICD-10-CM | POA: Diagnosis not present

## 2017-02-01 DIAGNOSIS — Z23 Encounter for immunization: Secondary | ICD-10-CM | POA: Diagnosis not present

## 2017-02-01 DIAGNOSIS — R5383 Other fatigue: Secondary | ICD-10-CM | POA: Diagnosis not present

## 2017-02-01 DIAGNOSIS — I10 Essential (primary) hypertension: Secondary | ICD-10-CM | POA: Diagnosis not present

## 2017-02-01 DIAGNOSIS — R0600 Dyspnea, unspecified: Secondary | ICD-10-CM | POA: Diagnosis not present

## 2017-02-01 DIAGNOSIS — R69 Illness, unspecified: Secondary | ICD-10-CM | POA: Diagnosis not present

## 2017-02-01 DIAGNOSIS — E559 Vitamin D deficiency, unspecified: Secondary | ICD-10-CM | POA: Diagnosis not present

## 2017-04-05 ENCOUNTER — Encounter: Payer: Self-pay | Admitting: Cardiology

## 2017-04-05 ENCOUNTER — Ambulatory Visit: Payer: Medicare HMO | Admitting: Cardiology

## 2017-04-05 VITALS — BP 152/95 | HR 85 | Ht 78.0 in | Wt 285.0 lb

## 2017-04-05 DIAGNOSIS — I2510 Atherosclerotic heart disease of native coronary artery without angina pectoris: Secondary | ICD-10-CM

## 2017-04-05 DIAGNOSIS — E782 Mixed hyperlipidemia: Secondary | ICD-10-CM

## 2017-04-05 DIAGNOSIS — I10 Essential (primary) hypertension: Secondary | ICD-10-CM | POA: Diagnosis not present

## 2017-04-05 DIAGNOSIS — R079 Chest pain, unspecified: Secondary | ICD-10-CM | POA: Diagnosis not present

## 2017-04-05 NOTE — Patient Instructions (Signed)
Medication Instructions:  Your physician has recommended you make the following change in your medication:  Start Over The Counter Zantac 150 mg Two Times Daily    Labwork: NONE   Testing/Procedures: NONE   Follow-Up: Your physician wants you to follow-up in: 1 Year with Dr. Harl Bowie. You will receive a reminder letter in the mail two months in advance. If you don't receive a letter, please call our office to schedule the follow-up appointment.   Any Other Special Instructions Will Be Listed Below (If Applicable).  You have been given a Blood Pressure Log to take to Dr.Gosrani   If you need a refill on your cardiac medications before your next appointment, please call your pharmacy.   Thank you for choosing [facility]!   DASH Eating Plan DASH stands for "Dietary Approaches to Stop Hypertension." The DASH eating plan is a healthy eating plan that has been shown to reduce high blood pressure (hypertension). It may also reduce your risk for type 2 diabetes, heart disease, and stroke. The DASH eating plan may also help with weight loss. What are tips for following this plan? General guidelines  Avoid eating more than 2,300 mg (milligrams) of salt (sodium) a day. If you have hypertension, you may need to reduce your sodium intake to 1,500 mg a day.  Limit alcohol intake to no more than 1 drink a day for nonpregnant women and 2 drinks a day for men. One drink equals 12 oz of beer, 5 oz of wine, or 1 oz of hard liquor.  Work with your health care provider to maintain a healthy body weight or to lose weight. Ask what an ideal weight is for you.  Get at least 30 minutes of exercise that causes your heart to beat faster (aerobic exercise) most days of the week. Activities may include walking, swimming, or biking.  Work with your health care provider or diet and nutrition specialist (dietitian) to adjust your eating plan to your individual calorie needs. Reading food  labels  Check food labels for the amount of sodium per serving. Choose foods with less than 5 percent of the Daily Value of sodium. Generally, foods with less than 300 mg of sodium per serving fit into this eating plan.  To find whole grains, look for the word "whole" as the first word in the ingredient list. Shopping  Buy products labeled as "low-sodium" or "no salt added."  Buy fresh foods. Avoid canned foods and premade or frozen meals. Cooking  Avoid adding salt when cooking. Use salt-free seasonings or herbs instead of table salt or sea salt. Check with your health care provider or pharmacist before using salt substitutes.  Do not fry foods. Cook foods using healthy methods such as baking, boiling, grilling, and broiling instead.  Cook with heart-healthy oils, such as olive, canola, soybean, or sunflower oil. Meal planning   Eat a balanced diet that includes: ? 5 or more servings of fruits and vegetables each day. At each meal, try to fill half of your plate with fruits and vegetables. ? Up to 6-8 servings of whole grains each day. ? Less than 6 oz of lean meat, poultry, or fish each day. A 3-oz serving of meat is about the same size as a deck of cards. One egg equals 1 oz. ? 2 servings of low-fat dairy each day. ? A serving of nuts, seeds, or beans 5 times each week. ? Heart-healthy fats. Healthy fats called Omega-3 fatty acids are found in foods  such as flaxseeds and coldwater fish, like sardines, salmon, and mackerel.  Limit how much you eat of the following: ? Canned or prepackaged foods. ? Food that is high in trans fat, such as fried foods. ? Food that is high in saturated fat, such as fatty meat. ? Sweets, desserts, sugary drinks, and other foods with added sugar. ? Full-fat dairy products.  Do not salt foods before eating.  Try to eat at least 2 vegetarian meals each week.  Eat more home-cooked food and less restaurant, buffet, and fast food.  When eating at a  restaurant, ask that your food be prepared with less salt or no salt, if possible. What foods are recommended? The items listed may not be a complete list. Talk with your dietitian about what dietary choices are best for you. Grains Whole-grain or whole-wheat bread. Whole-grain or whole-wheat pasta. Brown rice. [place]. Bulgur. Whole-grain and low-sodium cereals. Pita bread. Low-fat, low-sodium crackers. Whole-wheat flour tortillas. Vegetables Fresh or frozen vegetables (raw, steamed, roasted, or grilled). Low-sodium or reduced-sodium tomato and vegetable juice. Low-sodium or reduced-sodium tomato sauce and tomato paste. Low-sodium or reduced-sodium canned vegetables. Fruits All fresh, dried, or frozen fruit. Canned fruit in natural juice (without added sugar). Meat and other protein foods Skinless chicken or [place]. Ground chicken or [place]. Pork with fat trimmed off. Fish and seafood. Egg whites. Dried beans, peas, or lentils. Unsalted nuts, nut butters, and seeds. Unsalted canned beans. Lean cuts of beef with fat trimmed off. Low-sodium, lean deli meat. Dairy Low-fat (1%) or fat-free (skim) milk. Fat-free, low-fat, or reduced-fat cheeses. Nonfat, low-sodium ricotta or cottage cheese. Low-fat or nonfat yogurt. Low-fat, low-sodium cheese. Fats and oils Soft margarine without trans fats. Vegetable oil. Low-fat, reduced-fat, or light mayonnaise and salad dressings (reduced-sodium). Canola, safflower, olive, soybean, and sunflower oils. Avocado. Seasoning and other foods Herbs. Spices. Seasoning mixes without salt. Unsalted popcorn and pretzels. Fat-free sweets. What foods are not recommended? The items listed may not be a complete list. Talk with your dietitian about what dietary choices are best for you. Grains Baked goods made with fat, such as croissants, muffins, or some breads. Dry pasta or rice meal packs. Vegetables Creamed or fried vegetables. Vegetables in a cheese sauce.  Regular canned vegetables (not low-sodium or reduced-sodium). Regular canned tomato sauce and paste (not low-sodium or reduced-sodium). Regular tomato and vegetable juice (not low-sodium or reduced-sodium). Angie Fava. Olives. Fruits Canned fruit in a light or heavy syrup. Fried fruit. Fruit in cream or butter sauce. Meat and other protein foods Fatty cuts of meat. Ribs. Fried meat. Berniece Salines. Sausage. Bologna and other processed lunch meats. Salami. Fatback. Hotdogs. Bratwurst. Salted nuts and seeds. Canned beans with added salt. Canned or smoked fish. Whole eggs or egg yolks. Chicken or [place] with skin. Dairy Whole or 2% milk, cream, and half-and-half. Whole or full-fat cream cheese. Whole-fat or sweetened yogurt. Full-fat cheese. Nondairy creamers. Whipped toppings. Processed cheese and cheese spreads. Fats and oils Butter. Stick margarine. Lard. Shortening. Ghee. Bacon fat. Tropical oils, such as coconut, palm kernel, or palm oil. Seasoning and other foods Salted popcorn and pretzels. Onion salt, garlic salt, seasoned salt, table salt, and sea salt. Worcestershire sauce. Tartar sauce. Barbecue sauce. Teriyaki sauce. Soy sauce, including reduced-sodium. Steak sauce. Canned and packaged gravies. Fish sauce. Oyster sauce. Cocktail sauce. Horseradish that you find on the shelf. Ketchup. Mustard. Meat flavorings and tenderizers. Bouillon cubes. Hot sauce and Tabasco sauce. Premade or packaged marinades. Premade or packaged taco seasonings. Relishes. Regular salad  dressings. Where to find more information:  National Heart, Lung, and [place]: https://wilson-eaton.com/  American Heart Association: www.heart.org Summary  The DASH eating plan is a healthy eating plan that has been shown to reduce high blood pressure (hypertension). It may also reduce your risk for type 2 diabetes, heart disease, and stroke.  With the DASH eating plan, you should limit salt (sodium) intake to 2,300 mg a day. If you have  hypertension, you may need to reduce your sodium intake to 1,500 mg a day.  When on the DASH eating plan, aim to eat more fresh fruits and vegetables, whole grains, lean proteins, low-fat dairy, and heart-healthy fats.  Work with your health care provider or diet and nutrition specialist (dietitian) to adjust your eating plan to your individual calorie needs. This information is not intended to replace advice given to you by your health care provider. Make sure you discuss any questions you have with your health care provider. Document Released: 01/26/2011 Document Revised: 01/31/2016 Document Reviewed: 01/31/2016 Elsevier Interactive Patient Education  [employer].

## 2017-04-05 NOTE — Progress Notes (Signed)
Clinical Summary Mr. Terry is a 67 y.o.male seen today for follow up of the following medical problems.   1. 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 2-3/10 in severity. Often at rest. No diaphoresis, mild SOB. Feeling of needing to belch, acid like taste. More often occurs with spicy food.  Takes zantac just prn.   2. HTN - compliant with meds - has been using some aleve.       Past Medical History:  Diagnosis Date  . Alcohol abuse   . HLD (hyperlipidemia)   . HTN (hypertension)      No Known Allergies   Current Outpatient Medications  Medication Sig Dispense Refill  . amLODipine (NORVASC) 10 MG tablet TAKE 1 TABLET BY MOUTH EVERY DAY 90 tablet 3  . aspirin 81 MG EC tablet Take 81 mg by mouth daily.      Marland Kitchen atorvastatin (LIPITOR) 80 MG tablet Take 1 tablet (80 mg total) by mouth daily. 30 tablet 6  . diphenhydrAMINE (BENADRYL) 25 mg capsule Take 25 mg by mouth every 6 (six) hours as needed for itching or allergies.    Marland Kitchen ibuprofen (ADVIL,MOTRIN) 200 MG tablet Take 400 mg by mouth daily as needed for moderate pain.    . Multiple Vitamin (MULTIVITAMIN) tablet Take 1 tablet by mouth daily.      . vitamin C (ASCORBIC ACID) 500 MG tablet Take 500 mg by mouth daily.     No current facility-administered medications for this visit.      Past Surgical History:  Procedure Laterality Date  . 2 caths  10/21/05   caridac cath     No Known Allergies    Family History  Problem Relation Age of Onset  . Hypertension Unknown        family hx     Social History Mr. Mcclimans reports that he quit smoking about 23 years ago. His smoking use included cigarettes. He started smoking about 42 years ago. He has a 9.50 pack-year smoking history. he has never used smokeless tobacco. Mr. Rokosz reports that he drinks about 6.6 oz of alcohol per week.   Review of  Systems CONSTITUTIONAL: No weight loss, fever, chills, weakness or fatigue.  HEENT: Eyes: No visual loss, blurred vision, double vision or yellow sclerae.No hearing loss, sneezing, congestion, runny nose or sore throat.  SKIN: No rash or itching.  CARDIOVASCULAR: per hpi RESPIRATORY: No shortness of breath, cough or sputum.  GASTROINTESTINAL: No anorexia, nausea, vomiting or diarrhea. No abdominal pain or blood.  GENITOURINARY: No burning on urination, no polyuria NEUROLOGICAL: No headache, dizziness, syncope, paralysis, ataxia, numbness or tingling in the extremities. No change in bowel or bladder control.  MUSCULOSKELETAL: No muscle, back pain, joint pain or stiffness.  LYMPHATICS: No enlarged nodes. No history of splenectomy.  PSYCHIATRIC: No history of depression or anxiety.  ENDOCRINOLOGIC: No reports of sweating, cold or heat intolerance. No polyuria or polydipsia.  Marland Kitchen   Physical Examination Vitals:   04/05/17 1326  BP: (!) 152/95  Pulse: 85  SpO2: 98%   Vitals:   04/05/17 1326  Weight: 285 lb (129.3 kg)  Height: 6\' 6"  (1.981 m)    Gen: resting comfortably, no acute distress HEENT: no scleral icterus, pupils equal round and reactive, no palptable cervical adenopathy,  CV: RRR, no m/r/g, no jvd Resp: Clear to auscultation bilaterally GI: abdomen is soft, non-tender,  non-distended, normal bowel sounds, no hepatosplenomegaly MSK: extremities are warm, no edema.  Skin: warm, no rash Neuro:  no focal deficits Psych: appropriate affect   Diagnostic Studies 10/2005 Cath Central aortic pressure 136/96 with a mean of 115. LV pressure is 129/80 with LVEDP of 11. There was no aortic stenosis.  Left main was normal.  LAD was a long vessel wrapping the apex. It gave off a large branching first diagonal and a small second diagonal. There was a 40% lesion in the mid-LAD spanning the takeoff of the first diagonal. This was mildly hazy in one view. There was also  40% lesion in the proximal portion of the diagonal.  Left circumflex was a moderate-size vessel made up of a large OM1 and a small OM2. It was angiographically normal.  The right coronary artery was a very large dominant vessel. It gave off a RV Iva Montelongo, large branching PDA and two normal-size posterolaterals.  Left ventriculogram done in the RAO position showed an EF of 55% with distal anterior wall dyssynchrony. There was no mitral regurgitation. Left ventriculogram done in the LAO position showed an EF of 55-60% with normal septal and lateral wall motion.  ASSESSMENT: 1. Moderate nonobstructive coronary artery disease with a questionable  hazy lesion in the mid-left anterior descending artery. 2. Normal left ventricular function with distal anterior wall dyssynchrony  on the RAO left ventriculogram. The LAO ventriculogram was normal. 3. Abnormal EKG with stuttering chest pain suggestive of possible acute  coronary syndrome.  PLAN/DISCUSSION: The lesion in his LAD does not look obstructive but given his symptoms and abnormal ECG, I do think it is reasonable to proceed with IVUS of the LAD to evaluate for thrombus or underlying significant filling defect. This will be done by Dr. Burt Knack. He will need aggressive risk factor modification as well as control of his blood pressure.  FINDINGS: The distal LAD where we began pullback was normal. There was mild eccentric plaque distal to the area of interest. As we approached the area of interest there was concentric plaquing with very mild calcification present; there was actually a large atheroma burden. However, the luminal area remained nonobstructed throughout. The lumen at the tightest area of stenosis had an area of 6.4 square millimeters. The proximal LAD had very mild concentric plaquing and no other disease was noted.  ASSESSMENT: Moderate atherosclerotic concentric  plaquing involving the mid left anterior descending. There was no thrombus visualized.  PLAN: Medical therapy with aggressive risk factor modification.    Assessment and Plan  1. Non-obstructive CAD - chronic intermittent chest pain that is unchanged, previous cath with nonobstructive disease - recent symptoms most consistent wit GI etiology. Recommend OTC zantac 150mg  bid and follow symptoms  2. HTN - elevated in clinic. Given info on DASH diet, educated on low sodium diet, exercise and weight loss.  - he will keep a bp log and take to his pcp's visit next week, if trends are elevated will need changed in medical regimen.         Arnoldo Lenis, M.D.

## 2017-04-06 ENCOUNTER — Encounter: Payer: Self-pay | Admitting: *Deleted

## 2017-04-09 DIAGNOSIS — R69 Illness, unspecified: Secondary | ICD-10-CM | POA: Diagnosis not present

## 2017-04-09 DIAGNOSIS — I10 Essential (primary) hypertension: Secondary | ICD-10-CM | POA: Diagnosis not present

## 2017-04-09 DIAGNOSIS — R972 Elevated prostate specific antigen [PSA]: Secondary | ICD-10-CM | POA: Diagnosis not present

## 2017-04-10 ENCOUNTER — Encounter: Payer: Self-pay | Admitting: Cardiology

## 2017-04-19 DIAGNOSIS — R972 Elevated prostate specific antigen [PSA]: Secondary | ICD-10-CM | POA: Diagnosis not present

## 2017-04-19 DIAGNOSIS — N429 Disorder of prostate, unspecified: Secondary | ICD-10-CM | POA: Diagnosis not present

## 2017-05-10 ENCOUNTER — Encounter (HOSPITAL_COMMUNITY): Payer: Self-pay

## 2017-05-10 ENCOUNTER — Emergency Department (HOSPITAL_COMMUNITY): Payer: Medicare HMO

## 2017-05-10 ENCOUNTER — Emergency Department (HOSPITAL_COMMUNITY)
Admission: EM | Admit: 2017-05-10 | Discharge: 2017-05-10 | Disposition: A | Discharge status: Home / Self Care | Payer: Medicare HMO | Attending: Emergency Medicine | Admitting: Emergency Medicine

## 2017-05-10 DIAGNOSIS — E876 Hypokalemia: Secondary | ICD-10-CM | POA: Insufficient documentation

## 2017-05-10 DIAGNOSIS — I259 Chronic ischemic heart disease, unspecified: Secondary | ICD-10-CM | POA: Diagnosis not present

## 2017-05-10 DIAGNOSIS — E871 Hypo-osmolality and hyponatremia: Secondary | ICD-10-CM | POA: Insufficient documentation

## 2017-05-10 DIAGNOSIS — Z7982 Long term (current) use of aspirin: Secondary | ICD-10-CM | POA: Diagnosis not present

## 2017-05-10 DIAGNOSIS — R1013 Epigastric pain: Secondary | ICD-10-CM | POA: Diagnosis not present

## 2017-05-10 DIAGNOSIS — I10 Essential (primary) hypertension: Secondary | ICD-10-CM | POA: Insufficient documentation

## 2017-05-10 DIAGNOSIS — K5730 Diverticulosis of large intestine without perforation or abscess without bleeding: Secondary | ICD-10-CM | POA: Diagnosis not present

## 2017-05-10 DIAGNOSIS — Z87891 Personal history of nicotine dependence: Secondary | ICD-10-CM | POA: Diagnosis not present

## 2017-05-10 DIAGNOSIS — Z79899 Other long term (current) drug therapy: Secondary | ICD-10-CM | POA: Diagnosis not present

## 2017-05-10 DIAGNOSIS — R066 Hiccough: Secondary | ICD-10-CM | POA: Diagnosis not present

## 2017-05-10 DIAGNOSIS — E860 Dehydration: Secondary | ICD-10-CM | POA: Diagnosis not present

## 2017-05-10 DIAGNOSIS — R109 Unspecified abdominal pain: Secondary | ICD-10-CM | POA: Diagnosis present

## 2017-05-10 DIAGNOSIS — R079 Chest pain, unspecified: Secondary | ICD-10-CM | POA: Diagnosis not present

## 2017-05-10 LAB — COMPREHENSIVE METABOLIC PANEL
ALT: 29 U/L (ref 17–63)
AST: 36 U/L (ref 15–41)
Albumin: 3.2 g/dL — ABNORMAL LOW (ref 3.5–5.0)
Alkaline Phosphatase: 77 U/L (ref 38–126)
Anion gap: 17 — ABNORMAL HIGH (ref 5–15)
BUN: 58 mg/dL — ABNORMAL HIGH (ref 6–20)
CO2: 21 mmol/L — ABNORMAL LOW (ref 22–32)
Calcium: 8.5 mg/dL — ABNORMAL LOW (ref 8.9–10.3)
Chloride: 84 mmol/L — ABNORMAL LOW (ref 101–111)
Creatinine, Ser: 1.89 mg/dL — ABNORMAL HIGH (ref 0.61–1.24)
GFR calc Af Amer: 41 mL/min — ABNORMAL LOW (ref >60)
GFR calc non Af Amer: 36 mL/min — ABNORMAL LOW (ref >60)
Glucose, Bld: 117 mg/dL — ABNORMAL HIGH (ref 65–99)
Potassium: 2.7 mmol/L — CL (ref 3.5–5.1)
Sodium: 122 mmol/L — ABNORMAL LOW (ref 135–145)
Total Bilirubin: 2.1 mg/dL — ABNORMAL HIGH (ref 0.3–1.2)
Total Protein: 7.8 g/dL (ref 6.5–8.1)

## 2017-05-10 LAB — CBC WITH DIFFERENTIAL/PLATELET
Basophils Absolute: 0 10*3/uL (ref 0.0–0.1)
Basophils Relative: 0 %
Eosinophils Absolute: 0 10*3/uL (ref 0.0–0.7)
Eosinophils Relative: 0 %
HCT: 39.9 % (ref 39.0–52.0)
Hemoglobin: 14.2 g/dL (ref 13.0–17.0)
Lymphocytes Relative: 13 %
Lymphs Abs: 3.5 10*3/uL (ref 0.7–4.0)
MCH: 33.1 pg (ref 26.0–34.0)
MCHC: 35.6 g/dL (ref 30.0–36.0)
MCV: 93 fL (ref 78.0–100.0)
Monocytes Absolute: 1.3 10*3/uL — ABNORMAL HIGH (ref 0.1–1.0)
Monocytes Relative: 5 %
Neutro Abs: 22 10*3/uL — ABNORMAL HIGH (ref 1.7–7.7)
Neutrophils Relative %: 82 %
Platelets: 302 10*3/uL (ref 150–400)
RBC: 4.29 MIL/uL (ref 4.22–5.81)
RDW: 13 % (ref 11.5–15.5)
WBC: 26.8 10*3/uL — ABNORMAL HIGH (ref 4.0–10.5)

## 2017-05-10 LAB — LIPASE, BLOOD: Lipase: 81 U/L — ABNORMAL HIGH (ref 11–51)

## 2017-05-10 LAB — MAGNESIUM: Magnesium: 2 mg/dL (ref 1.7–2.4)

## 2017-05-10 MED ORDER — POTASSIUM CHLORIDE 10 MEQ/100ML IV SOLN
10.0000 meq | INTRAVENOUS | Status: AC
  Administered 2017-05-10 (×3): 10 meq via INTRAVENOUS
  Filled 2017-05-10 (×3): qty 100

## 2017-05-10 MED ORDER — OMEPRAZOLE 20 MG PO CPDR: 20.0000 mg | DELAYED_RELEASE_CAPSULE | Freq: Two times a day (BID) | ORAL | 0 refills | Status: DC

## 2017-05-10 MED ORDER — POTASSIUM CHLORIDE ER 10 MEQ PO TBCR: 10.0000 meq | EXTENDED_RELEASE_TABLET | Freq: Two times a day (BID) | ORAL | 0 refills | Status: DC

## 2017-05-10 MED ORDER — CHLORPROMAZINE HCL 25 MG/ML IJ SOLN
25.0000 mg | Freq: Once | INTRAMUSCULAR | Status: AC
  Administered 2017-05-10: 25 mg via INTRAMUSCULAR
  Filled 2017-05-10: qty 1

## 2017-05-10 MED ORDER — POTASSIUM CHLORIDE CRYS ER 20 MEQ PO TBCR
60.0000 meq | EXTENDED_RELEASE_TABLET | Freq: Once | ORAL | Status: AC
  Administered 2017-05-10: 60 meq via ORAL
  Filled 2017-05-10: qty 3

## 2017-05-10 MED ORDER — CHLORPROMAZINE HCL 25 MG PO TABS: 25.0000 mg | ORAL_TABLET | Freq: Three times a day (TID) | ORAL | 0 refills | Status: DC | PRN

## 2017-05-10 MED ORDER — ONDANSETRON 4 MG PO TBDP: 4.0000 mg | ORAL_TABLET | Freq: Three times a day (TID) | ORAL | 0 refills | Status: DC | PRN

## 2017-05-10 MED ORDER — MAGNESIUM SULFATE 2 GM/50ML IV SOLN
2.0000 g | Freq: Once | INTRAVENOUS | Status: AC
  Administered 2017-05-10: 2 g via INTRAVENOUS
  Filled 2017-05-10: qty 50

## 2017-05-10 MED ORDER — SODIUM CHLORIDE 0.9 % IV BOLUS (SEPSIS)
1000.0000 mL | Freq: Once | INTRAVENOUS | Status: AC
  Administered 2017-05-10: 1000 mL via INTRAVENOUS

## 2017-05-10 NOTE — ED Provider Notes (Signed)
[facility] EMERGENCY DEPARTMENT Provider Note   CSN: [id number] Arrival date & time: 05/10/17  1056     History   Chief Complaint Chief Complaint  Patient presents with  . Abdominal Pain    HPI Nathaniel Kline is a 67 y.o. male.  Complaint is hiccups for 6 days  HPI: Mr. Santistevan presents with complaint that he has been hiccuping for 6 days.  He stopped drinking about 8 days ago.  He drinks vodka daily.  He states the fifth will last him about 2 days.  He had a few days of some mild shakes that went away.  He has never had significant withdrawal or seizures.  He has not eaten much for the last several days because of the hiccups.  He states they are "just making him miserable.  He is describing epigastric pain during the episodes.  He has been able to sleep some.  No fevers.  No frank regurgitation.  No difficulty breathing.  No change in bowel or bladder.  Past Medical History:  Diagnosis Date  . Alcohol abuse   . HLD (hyperlipidemia)   . HTN (hypertension)     Patient Active Problem List   Diagnosis Date Noted  . Back pain 07/08/2013  . CAD, NATIVE VESSEL 09/07/2009  . HLD (hyperlipidemia) 11/26/2008  . ALCOHOL ABUSE 11/26/2008  . Essential hypertension 11/26/2008    Past Surgical History:  Procedure Laterality Date  . 2 caths  10/21/05   caridac cath       Home Medications    Prior to Admission medications   Medication Sig Start Date End Date Taking? Authorizing Provider  amLODipine (NORVASC) 10 MG tablet TAKE 1 TABLET BY MOUTH EVERY DAY 01/15/17  Yes Branch, Alphonse Guild, MD  aspirin 81 MG EC tablet Take 81 mg by mouth daily.     Yes [provider]  diphenhydrAMINE (BENADRYL) 25 mg capsule Take 25 mg by mouth every 6 (six) hours as needed for itching or allergies.   Yes [provider]  Multiple Vitamin (MULTIVITAMIN) tablet Take 1 tablet by mouth daily.     Yes [provider]  vitamin C (ASCORBIC ACID) 500 MG tablet Take 500 mg by mouth  daily.   Yes [provider]  chlorproMAZINE (THORAZINE) 25 MG tablet Take 1 tablet (25 mg total) by mouth 3 (three) times daily as needed for hiccoughs. 05/10/17   Tanna Furry, MD  omeprazole (PRILOSEC) 20 MG capsule Take 1 capsule (20 mg total) by mouth 2 (two) times daily. 05/10/17   Tanna Furry, MD  ondansetron (ZOFRAN ODT) 4 MG disintegrating tablet Take 1 tablet (4 mg total) by mouth every 8 (eight) hours as needed for nausea. 05/10/17   Tanna Furry, MD  potassium chloride (K-DUR) 10 MEQ tablet Take 1 tablet (10 mEq total) by mouth 2 (two) times daily. 05/10/17   Tanna Furry, MD    Family History Family History  Problem Relation Age of Onset  . Hypertension Unknown        family hx  . Diabetes Mother   . High blood pressure Mother   . High blood pressure Sister   . High blood pressure Brother   . Cancer Brother   . High blood pressure Sister     Social History Social History   Tobacco Use  . Smoking status: Former Smoker    Packs/day: 0.50    Years: 19.00    Pack years: 9.50    Types: Cigarettes  Start date: 08/21/1974    Last attempt to quit: 08/20/1993    Years since quitting: 23.7  . Smokeless tobacco: Never Used  . Tobacco comment: tobacco use - no  Substance Use Topics  . Alcohol use: Yes    Alcohol/week: 2.4 oz    Types: 4 Shots of liquor per week    Comment: 2-3 fifths/day  . Drug use: No     Allergies   Other   Review of Systems Review of Systems  Constitutional: Negative for appetite change, chills, diaphoresis, fatigue and fever.  HENT: Negative for mouth sores, sore throat and trouble swallowing.   Eyes: Negative for visual disturbance.  Respiratory: Negative for cough, chest tightness, shortness of breath and wheezing.        Hiccups  Cardiovascular: Positive for chest pain.  Gastrointestinal: Positive for abdominal pain. Negative for abdominal distention, diarrhea, nausea and vomiting.  Endocrine: Negative for polydipsia, polyphagia and  polyuria.  Genitourinary: Negative for dysuria, frequency and hematuria.  Musculoskeletal: Negative for gait problem.  Skin: Negative for color change, pallor and rash.  Neurological: Negative for dizziness, syncope, light-headedness and headaches.  Hematological: Does not bruise/bleed easily.  Psychiatric/Behavioral: Negative for behavioral problems and confusion.     Physical Exam Updated Vital Signs BP 118/73   Pulse (!) 36   Temp 100 F (37.8 C) (Oral)   Resp 17   Ht 6\' 4"  (1.93 m)   Wt 122.5 kg (270 lb)   SpO2 (!) 85%   BMI 32.87 kg/m   Physical Exam  Constitutional: He is oriented to person, place, and time. He appears well-developed and well-nourished. No distress.  Large stature pleasant adult male.  Occasional hiccups.  HENT:  Head: Normocephalic.  Eyes: Pupils are equal, round, and reactive to light. Conjunctivae are normal. No scleral icterus.  Neck: Normal range of motion. Neck supple. No thyromegaly present.  Cardiovascular: Normal rate and regular rhythm. Exam reveals no gallop and no friction rub.  No murmur heard. Pulmonary/Chest: Effort normal and breath sounds normal. No respiratory distress. He has no wheezes. He has no rales.  Abdominal: Soft. Bowel sounds are normal. He exhibits no distension. There is no tenderness. There is no rebound.  No  reproducible tenderness in the abdomen.  Musculoskeletal: Normal range of motion.  Neurological: He is alert and oriented to person, place, and time.  Skin: Skin is warm and dry. No rash noted.  Psychiatric: He has a normal mood and affect. His behavior is normal.     ED Treatments / Results  Labs (all labs ordered are listed, but only abnormal results are displayed) Labs Reviewed  CBC WITH DIFFERENTIAL/PLATELET - Abnormal; Notable for the following components:      Result Value   WBC 26.8 (*)    Neutro Abs 22.0 (*)    Monocytes Absolute 1.3 (*)    All other components within normal limits  COMPREHENSIVE  METABOLIC PANEL - Abnormal; Notable for the following components:   Sodium 122 (*)    Potassium 2.7 (*)    Chloride 84 (*)    CO2 21 (*)    Glucose, Bld 117 (*)    BUN 58 (*)    Creatinine, Ser 1.89 (*)    Calcium 8.5 (*)    Albumin 3.2 (*)    Total Bilirubin 2.1 (*)    GFR calc non Af Amer 36 (*)    GFR calc Af Amer 41 (*)    Anion gap 17 (*)    All  other components within normal limits  LIPASE, BLOOD - Abnormal; Notable for the following components:   Lipase 81 (*)    All other components within normal limits  MAGNESIUM    EKG  EKG Interpretation  Date/Time:  Thursday May 10 2017 12:40:04 EDT Ventricular Rate:  93 PR Interval:    QRS Duration: 100 QT Interval:  399 QTC Calculation: 497 R Axis:   180 Text Interpretation:  Sinus rhythm Right axis deviation Probable anteroseptal infarct, old Baseline wander in lead(s) V2 V6 No significant change vs comparison 02/2013 Confirmed by Tanna Furry [phone]) on 05/10/2017 2:25:28 PM       Radiology Dg Chest 2 View  Result Date: 05/10/2017 CLINICAL DATA:  Hiccups for 6 days, belching, epigastric pain, burning with belching, history hypertension EXAM: CHEST - 2 VIEW COMPARISON:  02/01/2017 FINDINGS: Normal heart size, mediastinal contours, and pulmonary vascularity. Lungs clear. No acute infiltrate, pleural effusion or pneumothorax. Mild biconvex thoracic scoliosis. Significant costovertebral degenerative changes at RIGHT seventh rib. IMPRESSION: No acute abnormalities. Electronically Signed   By: Lavonia Dana M.D.   On: 05/10/2017 13:32    Procedures Procedures (including critical care time)  Medications Ordered in ED Medications  potassium chloride 10 mEq in 100 mL IVPB (10 mEq Intravenous New Bag/Given 05/10/17 1541)  chlorproMAZINE (THORAZINE) injection 25 mg (25 mg Intramuscular Given 05/10/17 1324)  magnesium sulfate IVPB 2 g 50 mL (0 g Intravenous Stopped 05/10/17 1541)  sodium chloride 0.9 % bolus 1,000 mL (1,000 mLs  Intravenous New Bag/Given 05/10/17 1419)  potassium chloride SA (K-DUR,KLOR-CON) CR tablet 60 mEq (60 mEq Oral Given 05/10/17 1418)     Initial Impression / Assessment and Plan / ED Course  I have reviewed the triage vital signs and the nursing notes.  Pertinent labs & imaging results that were available during my care of the patient were reviewed by me and considered in my medical decision making (see chart for details).   Given IM Thorazine.  His symptoms have improved.  He has multiple electrolyte abnormalities.  Likely related to his alcohol intake and poor p.o. intake.  He is hyponatremic at 122.  Potassium 2.7.  He has leukocytosis but no fever.  Plan is noncontrast CT of the abdomen.  He has mild renal insufficiency and elevation of BUN likely prerenal.  Plan IV fluids which should correct sodium and BUN creatinine.  Is given p.o., and IV potassium and magnesium replacement.  A CT of the abdomen shows no acute abnormality sub-diaphragmatic, I think he would be appropriate for discharge home to continue on p.o. potassium, Zofran, Prilosec, and as needed Thorazine.  Final Clinical Impressions(s) / ED Diagnoses   Final diagnoses:  Hiccups  Hypokalemia  Hyponatremia  Dehydration    ED Discharge Orders        Ordered    chlorproMAZINE (THORAZINE) 25 MG tablet  3 times daily PRN     05/10/17 1551    potassium chloride (K-DUR) 10 MEQ tablet  2 times daily     05/10/17 1551    omeprazole (PRILOSEC) 20 MG capsule  2 times daily     05/10/17 1551    ondansetron (ZOFRAN ODT) 4 MG disintegrating tablet  Every 8 hours PRN     05/10/17 1551       Tanna Furry, MD 05/10/17 1552

## 2017-05-10 NOTE — ED Provider Notes (Signed)
Patient CT scan does not show any acute emergent pathology. Does show some liver cysts and a likely cyst on his right kidney.  We discussed need for follow-up with PCP.  Also has some sub-pathologic retroperitoneal lymph nodes that he was also made aware of.  Otherwise we discussed the importance of reducing alcohol intake and better nutrition.  He has been given prescriptions by Dr. Jeneen Rinks.  He has finished his runs of IV potassium.  He appears stable for discharge as he is feeling quite well.  We discussed strict return precautions.  Results for orders placed or performed during the hospital encounter of 05/10/17  CBC with Differential/Platelet  Result Value Ref Range   WBC 26.8 (H) 4.0 - 10.5 K/uL   RBC 4.29 4.22 - 5.81 MIL/uL   Hemoglobin 14.2 13.0 - 17.0 g/dL   HCT 39.9 39.0 - 52.0 %   MCV 93.0 78.0 - 100.0 fL   MCH 33.1 26.0 - 34.0 pg   MCHC 35.6 30.0 - 36.0 g/dL   RDW 13.0 11.5 - 15.5 %   Platelets 302 150 - 400 K/uL   Neutrophils Relative % 82 %   Lymphocytes Relative 13 %   Monocytes Relative 5 %   Eosinophils Relative 0 %   Basophils Relative 0 %   Neutro Abs 22.0 (H) 1.7 - 7.7 K/uL   Lymphs Abs 3.5 0.7 - 4.0 K/uL   Monocytes Absolute 1.3 (H) 0.1 - 1.0 K/uL   Eosinophils Absolute 0.0 0.0 - 0.7 K/uL   Basophils Absolute 0.0 0.0 - 0.1 K/uL   WBC Morphology MILD LEFT SHIFT (1-5% METAS, OCC MYELO, OCC BANDS)   Comprehensive metabolic panel  Result Value Ref Range   Sodium 122 (L) 135 - 145 mmol/L   Potassium 2.7 (LL) 3.5 - 5.1 mmol/L   Chloride 84 (L) 101 - 111 mmol/L   CO2 21 (L) 22 - 32 mmol/L   Glucose, Bld 117 (H) 65 - 99 mg/dL   BUN 58 (H) 6 - 20 mg/dL   Creatinine, Ser 1.89 (H) 0.61 - 1.24 mg/dL   Calcium 8.5 (L) 8.9 - 10.3 mg/dL   Total Protein 7.8 6.5 - 8.1 g/dL   Albumin 3.2 (L) 3.5 - 5.0 g/dL   AST 36 15 - 41 U/L   ALT 29 17 - 63 U/L   Alkaline Phosphatase 77 38 - 126 U/L   Total Bilirubin 2.1 (H) 0.3 - 1.2 mg/dL   GFR calc non Af Amer 36 (L) >60 mL/min   GFR  calc Af Amer 41 (L) >60 mL/min   Anion gap 17 (H) 5 - 15  Lipase, blood  Result Value Ref Range   Lipase 81 (H) 11 - 51 U/L  Magnesium  Result Value Ref Range   Magnesium 2.0 1.7 - 2.4 mg/dL   Ct Abdomen Pelvis Wo Contrast  Result Date: 05/10/2017 CLINICAL DATA:  Epigastric pain from having hiccups for 6 days. EXAM: CT ABDOMEN AND PELVIS WITHOUT CONTRAST TECHNIQUE: Multidetector CT imaging of the abdomen and pelvis was performed following the standard protocol without IV contrast. COMPARISON:  Body CT 10/22/2005 FINDINGS: Lower chest: No acute abnormality. Hepatobiliary: 2 benign-appearing liver cysts. Normal liver contour. Normal gallbladder. Pancreas: Unremarkable. No pancreatic ductal dilatation or surrounding inflammatory changes. Spleen: Normal in size without focal abnormality. Adrenals/Urinary Tract: Normal adrenal glands. Normal appearance of the left kidney. Circumscribed hypoattenuated lesion in the upper pole of the right kidney measures 2.7 cm. A smaller difficult to delineate water density lesion is  seen medially to it. Stomach/Bowel: Stomach is within normal limits. Appendix appears normal. No evidence of small bowel wall thickening, distention, or inflammatory changes. Left colonic diverticulosis with long segment of wall thickening of the sigmoid colon, likely representing chronic diverticulitis, as there no significant pericolonic mesenteric changes. Vascular/Lymphatic: Numerous shotty sub pathologic by CT criteria upper abdominal retroperitoneal and porta hepatis lymph nodes. Reproductive: Enlargement of the prostate gland, which measures 5.6 cm. Other: No abdominal wall hernia or abnormality. No abdominopelvic ascites. Musculoskeletal: Multilevel osteoarthritic changes of the visualized spine with associated posterior facet arthropathy. IMPRESSION: Two benign-appearing liver cysts. 2 water density hypoattenuated lesions in the right kidney, probably also representing cysts. Number  shotty sub pathologic by CT criteria upper abdominal retroperitoneal lymph nodes. 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 Enlargement of the prostate gland. Please correlate to serum PSA values. Electronically Signed   By: Fidela Salisbury M.D.   On: 05/10/2017 18:31   Dg Chest 2 View  Result Date: 05/10/2017 CLINICAL DATA:  Hiccups for 6 days, belching, epigastric pain, burning with belching, history hypertension EXAM: CHEST - 2 VIEW COMPARISON:  02/01/2017 FINDINGS: Normal heart size, mediastinal contours, and pulmonary vascularity. Lungs clear. No acute infiltrate, pleural effusion or pneumothorax. Mild biconvex thoracic scoliosis. Significant costovertebral degenerative changes at RIGHT seventh rib. IMPRESSION: No acute abnormalities. Electronically Signed   By: Lavonia Dana M.D.   On: 05/10/2017 13:32      Sherwood Gambler, MD 05/10/17 1859

## 2017-05-10 NOTE — ED Triage Notes (Signed)
Ems reports pt c/o epigastric pain from having hiccups x 6 days.

## 2017-05-10 NOTE — ED Notes (Signed)
Pt wheeled to waiting room. Pt verbalized understanding of discharge instructions.   

## 2017-05-10 NOTE — ED Notes (Addendum)
Date and time results received: 05/10/17 1336   Test: Potassium Critical Value: 2.7  Name of Provider Notified: Dr. Jeneen Rinks  Orders Received? Or Actions Taken?: None given at this time.

## 2017-05-10 NOTE — Discharge Instructions (Addendum)
Avoid alcohol. Potassium prescription.  Take with food. Thorazine as needed for hiccups Prilosec to complete 2 weeks, or until abdominal pain resolves.   Zofran for nausea. Push fluids to rehydrate

## 2017-05-16 DIAGNOSIS — N401 Enlarged prostate with lower urinary tract symptoms: Secondary | ICD-10-CM | POA: Diagnosis not present

## 2017-05-16 DIAGNOSIS — R69 Illness, unspecified: Secondary | ICD-10-CM | POA: Diagnosis not present

## 2017-05-16 DIAGNOSIS — I10 Essential (primary) hypertension: Secondary | ICD-10-CM | POA: Diagnosis not present

## 2017-05-16 DIAGNOSIS — R5383 Other fatigue: Secondary | ICD-10-CM | POA: Diagnosis not present

## 2017-08-07 DIAGNOSIS — H52223 Regular astigmatism, bilateral: Secondary | ICD-10-CM | POA: Diagnosis not present

## 2017-10-21 ENCOUNTER — Other Ambulatory Visit: Payer: Self-pay | Admitting: Cardiology

## 2017-10-25 DIAGNOSIS — R972 Elevated prostate specific antigen [PSA]: Secondary | ICD-10-CM | POA: Diagnosis not present

## 2017-11-20 DIAGNOSIS — J309 Allergic rhinitis, unspecified: Secondary | ICD-10-CM | POA: Diagnosis not present

## 2017-11-20 DIAGNOSIS — R599 Enlarged lymph nodes, unspecified: Secondary | ICD-10-CM | POA: Diagnosis not present

## 2017-11-20 DIAGNOSIS — Z23 Encounter for immunization: Secondary | ICD-10-CM | POA: Diagnosis not present

## 2018-01-03 ENCOUNTER — Ambulatory Visit: Payer: Medicare HMO | Admitting: General Surgery

## 2018-03-07 ENCOUNTER — Ambulatory Visit: Payer: Medicare HMO | Admitting: General Surgery

## 2018-04-04 ENCOUNTER — Ambulatory Visit: Payer: Medicare HMO | Admitting: General Surgery

## 2018-04-04 ENCOUNTER — Encounter: Payer: Self-pay | Admitting: General Surgery

## 2018-04-04 VITALS — BP 164/89 | HR 94 | Temp 96.9°F | Resp 18 | Wt 274.2 lb

## 2018-04-04 DIAGNOSIS — R590 Localized enlarged lymph nodes: Secondary | ICD-10-CM | POA: Diagnosis not present

## 2018-04-04 NOTE — Patient Instructions (Signed)
Lymphadenopathy    Lymphadenopathy means that your lymph glands are swollen or larger than normal (enlarged). Lymph glands, also called lymph nodes, are collections of tissue that filter bacteria, viruses, and waste from your bloodstream. They are part of your body's disease-fighting system (immune system), which protects your body from germs.  There may be different causes of lymphadenopathy, depending on where it is in your body. Some types go away on their own. Lymphadenopathy can occur anywhere that you have lymph glands, including these areas:  · Neck (cervical lymphadenopathy).  · Chest (mediastinal lymphadenopathy).  · Lungs (hilar lymphadenopathy).  · Underarms (axillary lymphadenopathy).  · Groin (inguinal lymphadenopathy).  When your immune system responds to germs, infection-fighting cells and fluid build up in your lymph glands. This causes some swelling and enlargement. If the lymph glands do not go back to normal after you have an infection or disease, your health care provider may do tests. These tests help to monitor your condition and find the reason why the glands are still swollen and enlarged.  Follow these instructions at home:  · Get plenty of rest.  · Take over-the-counter and prescription medicines only as told by your health care provider. Your health care provider may recommend over-the-counter medicines for pain.  · If directed, apply heat to swollen lymph glands as often as told by your health care provider. Use the heat source that your health care provider recommends, such as a moist heat pack or a heating pad.  ? Place a towel between your skin and the heat source.  ? Leave the heat on for 20-30 minutes.  ? Remove the heat if your skin turns bright red. This is especially important if you are unable to feel pain, heat, or cold. You may have a greater risk of getting burned.  · Check your affected lymph glands every day for changes. Check other lymph gland areas as told by your health  care provider. Check for changes such as:  ? More swelling.  ? Sudden increase in size.  ? Redness or pain.  ? Hardness.  · Keep all follow-up visits as told by your health care provider. This is important.  Contact a health care provider if you have:  · Swelling that gets worse or spreads to other areas.  · Problems with breathing.  · Lymph glands that:  ? Are still swollen after 2 weeks.  ? Have suddenly gotten bigger.  ? Are red, painful, or hard.  · A fever or chills.  · Fatigue.  · A sore throat.  · Pain in your abdomen.  · Weight loss.  · Night sweats.  Get help right away if you have:  · Fluid leaking from an enlarged lymph gland.  · Severe pain.  · Chest pain.  · Shortness of breath.  Summary  · Lymphadenopathy means that your lymph glands are swollen or larger than normal (enlarged).  · Lymph glands (also called lymph nodes) are collections of tissue that filter bacteria, viruses, and waste from the bloodstream. They are part of your body's disease-fighting system (immune system).  · Lymphadenopathy can occur anywhere that you have lymph glands.  · If your enlarged and swollen lymph glands do not go back to normal after you have an infection or disease, your health care provider may do tests to monitor your condition and find the reason why the glands are still swollen and enlarged.  · Check your affected lymph glands every day for changes. Check other lymph   gland areas as told by your health care provider.  This information is not intended to replace advice given to you by your health care provider. Make sure you discuss any questions you have with your health care provider.  Document Released: 11/16/2007 Document Revised: 12/22/2016 Document Reviewed: 12/22/2016  Elsevier Interactive Patient Education © 2019 [employer]

## 2018-04-04 NOTE — Progress Notes (Signed)
Nathaniel Kline; [id number]; 08-14-1950   HPI Is a 68 year old black male who was referred to my care by Dr. Anastasio Champion for evaluation treatment of a swollen lymph node in the right axilla.  Patient states is been present for many months.  He denies any trauma to the right arm.  No lump is noted in the right breast.  He has no other lymph nodes that are swollen.  He denies any significant fever or chills.  He states he has had no weight loss.  He has 0 out of 10 pain.  No recent growth of the lymph node has been noted. Past Medical History:  Diagnosis Date  . Alcohol abuse   . HLD (hyperlipidemia)   . HTN (hypertension)     Past Surgical History:  Procedure Laterality Date  . 2 caths  10/21/05   caridac cath    Family History  Problem Relation Age of Onset  . Hypertension Unknown        family hx  . Diabetes Mother   . High blood pressure Mother   . High blood pressure Sister   . High blood pressure Brother   . Cancer Brother   . High blood pressure Sister     Current Outpatient Medications on File Prior to Visit  Medication Sig Dispense Refill  . amLODipine (NORVASC) 10 MG tablet TAKE 1 TABLET BY MOUTH EVERY DAY 90 tablet 3  . aspirin 81 MG EC tablet Take 81 mg by mouth daily.      . diphenhydrAMINE (BENADRYL) 25 mg capsule Take 25 mg by mouth every 6 (six) hours as needed for itching or allergies.    . Multiple Vitamin (MULTIVITAMIN) tablet Take 1 tablet by mouth daily.      . vitamin C (ASCORBIC ACID) 500 MG tablet Take 500 mg by mouth daily.     No current facility-administered medications on file prior to visit.     Allergies  Allergen Reactions  . Other     Peanut butter, carrots, celery     Social History   Substance and Sexual Activity  Alcohol Use Yes  . Alcohol/week: 4.0 standard drinks  . Types: 4 Shots of liquor per week   Comment: 2-3 fifths/day    Social History   Tobacco Use  Smoking Status Former Smoker  . Packs/day: 0.50  . Years: 19.00  . Pack  years: 9.50  . Types: Cigarettes  . Start date: 08/21/1974  . Last attempt to quit: 08/20/1993  . Years since quitting: 24.6  Smokeless Tobacco Never Used  Tobacco Comment   tobacco use - no    Review of Systems  Constitutional: Negative.   HENT: Negative.   Eyes: Negative.   Respiratory: Negative.   Cardiovascular: Negative.   Gastrointestinal: Positive for heartburn.  Genitourinary: Negative.   Musculoskeletal: Positive for back pain.  Skin: Negative.   Neurological: Negative.   Endo/Heme/Allergies: Negative.   Psychiatric/Behavioral: Negative.     Objective   Vitals:   04/04/18 0946  BP: (!) 164/89  Pulse: 94  Resp: 18  Temp: (!) 96.9 F (36.1 C)    Physical Exam Vitals signs reviewed.  Constitutional:      Appearance: Normal appearance. He is obese. He is not ill-appearing.  HENT:     Head: Normocephalic and atraumatic.  Neck:     Musculoskeletal: Normal range of motion and neck supple.  Cardiovascular:     Rate and Rhythm: Normal rate and regular rhythm.     Heart  sounds: Normal heart sounds. No murmur. No friction rub. No gallop.   Pulmonary:     Effort: Pulmonary effort is normal. No respiratory distress.     Breath sounds: Normal breath sounds. No stridor. No wheezing, rhonchi or rales.  Lymphadenopathy:     Cervical: No cervical adenopathy.  Skin:    General: Skin is warm and dry.     Comments: 5 cm ovoid subcutaneous rubbery mass noted in right upper back.  Benign in nature.  Neurological:     Mental Status: He is alert and oriented to person, place, and time.   Right axilla with single pea-sized mobile nodule present.  No skin changes noted.  Left axilla negative.  Primary care notes reviewed  Assessment  Palpable nodule in the right axilla.  Question lymph node versus lipoma. Plan   Will get ultrasound of right axilla to further assess.  Further management is pending those results.

## 2018-04-10 ENCOUNTER — Other Ambulatory Visit: Payer: Self-pay | Admitting: General Surgery

## 2018-04-10 ENCOUNTER — Ambulatory Visit (HOSPITAL_COMMUNITY): Admission: RE | Admit: 2018-04-10 | Payer: Medicare HMO | Source: Ambulatory Visit

## 2018-04-10 DIAGNOSIS — R590 Localized enlarged lymph nodes: Secondary | ICD-10-CM

## 2018-04-11 ENCOUNTER — Other Ambulatory Visit: Payer: Self-pay | Admitting: Emergency Medicine

## 2018-04-11 DIAGNOSIS — R590 Localized enlarged lymph nodes: Secondary | ICD-10-CM

## 2018-04-24 ENCOUNTER — Other Ambulatory Visit: Payer: Self-pay | Admitting: General Surgery

## 2018-04-24 ENCOUNTER — Encounter (HOSPITAL_COMMUNITY): Payer: Self-pay

## 2018-04-24 ENCOUNTER — Ambulatory Visit (HOSPITAL_COMMUNITY)
Admission: RE | Admit: 2018-04-24 | Discharge: 2018-04-24 | Disposition: A | Discharge status: Home / Self Care | Payer: Medicare HMO | Source: Ambulatory Visit | Attending: General Surgery | Admitting: General Surgery

## 2018-04-24 DIAGNOSIS — R590 Localized enlarged lymph nodes: Secondary | ICD-10-CM

## 2018-04-24 DIAGNOSIS — R229 Localized swelling, mass and lump, unspecified: Secondary | ICD-10-CM | POA: Diagnosis not present

## 2018-04-24 DIAGNOSIS — D219 Benign neoplasm of connective and other soft tissue, unspecified: Secondary | ICD-10-CM | POA: Diagnosis not present

## 2018-04-24 DIAGNOSIS — R222 Localized swelling, mass and lump, trunk: Secondary | ICD-10-CM | POA: Diagnosis not present

## 2018-04-24 MED ORDER — LIDOCAINE HCL (PF) 2 % IJ SOLN
INTRAMUSCULAR | Status: AC
  Filled 2018-04-24: qty 10

## 2018-04-24 NOTE — Procedures (Signed)
PreOperative Dx: RIGHT axillary mass/adenopathy Postoperative Dx: RIGHT axillary mass/adenopathy Procedure:   [place] core biopsy of RIGHT axillary mass/adenopathy Radiologist:  Thornton Papas Anesthesia:  5 ml of 2% lidocaine Specimen:  18 g core biopsy x 4 EBL:   < 1 ml Complications: None

## 2018-04-24 NOTE — Sedation Documentation (Signed)
PT tolerated procedure well. D/c instructions and post biopsy care given to pt and pt verbalized understanding.

## 2018-05-01 ENCOUNTER — Telehealth: Payer: Self-pay | Admitting: Emergency Medicine

## 2018-05-01 NOTE — Telephone Encounter (Signed)
Patient called and requested lab results from biopsy. After reviewing the pathology I notified the patient that the results we good and nothing was abnormal. Patient thanked me for the results and I notified him that if he has any other questions or concerns to feel free to call the office.

## 2018-05-07 ENCOUNTER — Ambulatory Visit: Payer: Medicare HMO | Admitting: General Surgery

## 2018-09-23 ENCOUNTER — Other Ambulatory Visit: Payer: Self-pay

## 2018-09-23 ENCOUNTER — Other Ambulatory Visit: Payer: Medicare HMO

## 2018-09-23 DIAGNOSIS — Z20822 Contact with and (suspected) exposure to covid-19: Secondary | ICD-10-CM

## 2018-09-23 DIAGNOSIS — R6889 Other general symptoms and signs: Secondary | ICD-10-CM | POA: Diagnosis not present

## 2018-09-23 NOTE — Progress Notes (Unsigned)
la 

## 2018-09-24 LAB — NOVEL CORONAVIRUS, NAA: SARS-CoV-2, NAA: NOT DETECTED

## 2018-09-30 ENCOUNTER — Telehealth (INDEPENDENT_AMBULATORY_CARE_PROVIDER_SITE_OTHER): Payer: Self-pay | Admitting: Internal Medicine

## 2018-09-30 NOTE — Telephone Encounter (Signed)
Pt aware covid lab test negative, not detected °

## 2018-10-10 DIAGNOSIS — F1010 Alcohol abuse, uncomplicated: Secondary | ICD-10-CM | POA: Diagnosis not present

## 2018-10-10 DIAGNOSIS — I10 Essential (primary) hypertension: Secondary | ICD-10-CM | POA: Diagnosis not present

## 2018-10-10 DIAGNOSIS — N401 Enlarged prostate with lower urinary tract symptoms: Secondary | ICD-10-CM | POA: Diagnosis not present

## 2018-10-10 DIAGNOSIS — R5383 Other fatigue: Secondary | ICD-10-CM | POA: Diagnosis not present

## 2018-10-10 DIAGNOSIS — E559 Vitamin D deficiency, unspecified: Secondary | ICD-10-CM | POA: Diagnosis not present

## 2018-10-10 DIAGNOSIS — R972 Elevated prostate specific antigen [PSA]: Secondary | ICD-10-CM | POA: Diagnosis not present

## 2018-10-18 IMAGING — CT CT ABD-PELV W/O CM
2 of 4 series · 16 of 46 positions shown, 18 images · non-contrast
Comparison: Body CT 10/22/2005

CLINICAL DATA: Epigastric pain from having hiccups for 6 days.

EXAM:
CT ABDOMEN AND PELVIS WITHOUT CONTRAST
TECHNIQUE: Multidetector CT imaging of the abdomen and pelvis was performed
following the standard protocol without IV contrast.

[Series 2: axial st · axial · 0.94mm/px · z∈[-427,+18]mm · 13 of 97 slices shown, 15 images]
[im 4/97  soft-tissue]
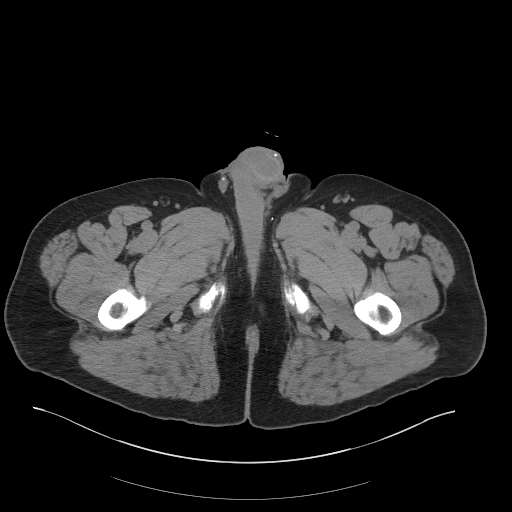
[im 4/97  bone]
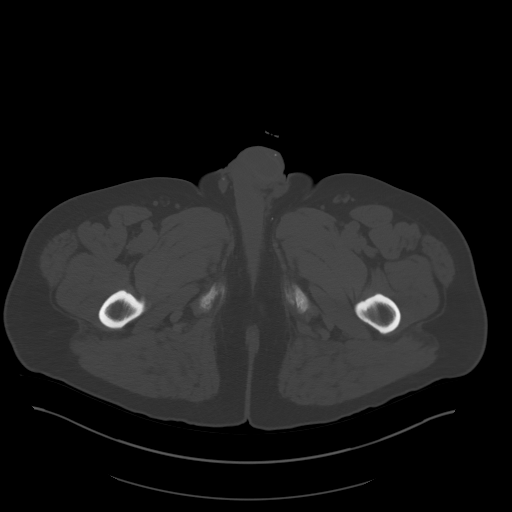
[im 12/97  soft-tissue]
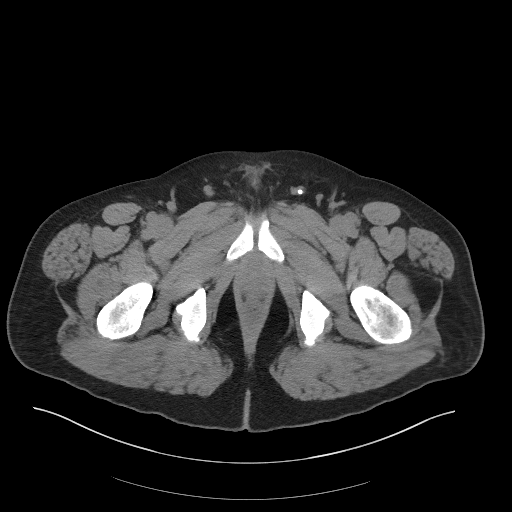
[im 19/97  soft-tissue]
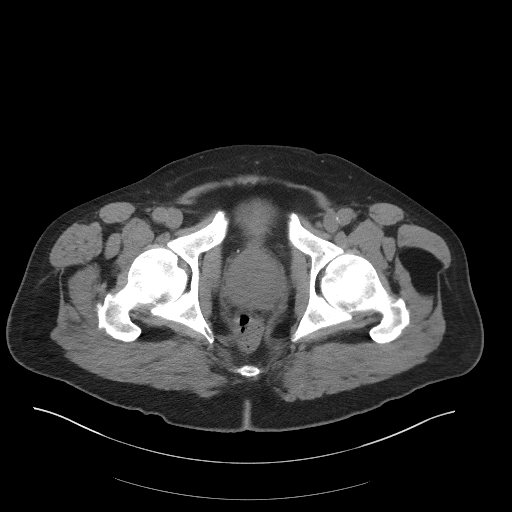
[im 26/97  soft-tissue]
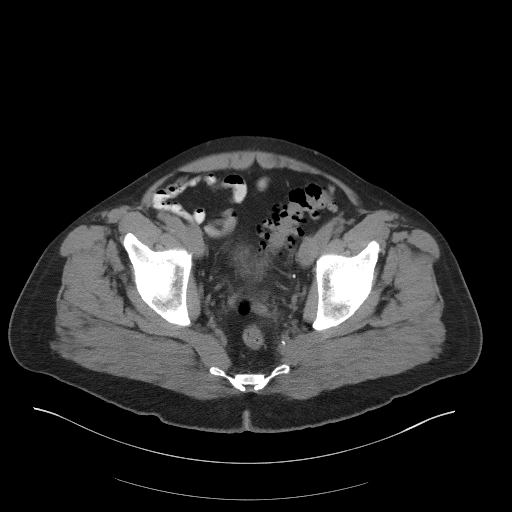
[im 34/97  soft-tissue]
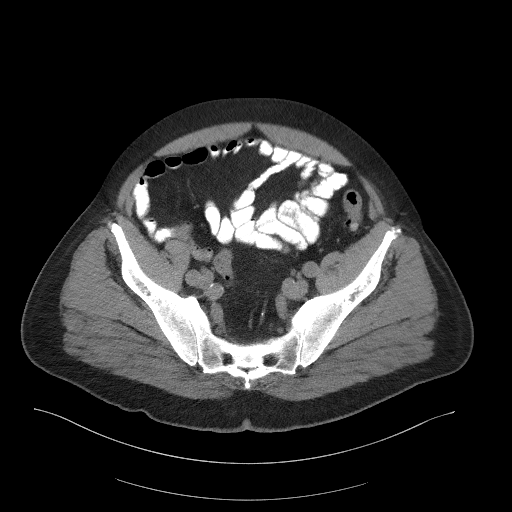
[im 41/97  soft-tissue]
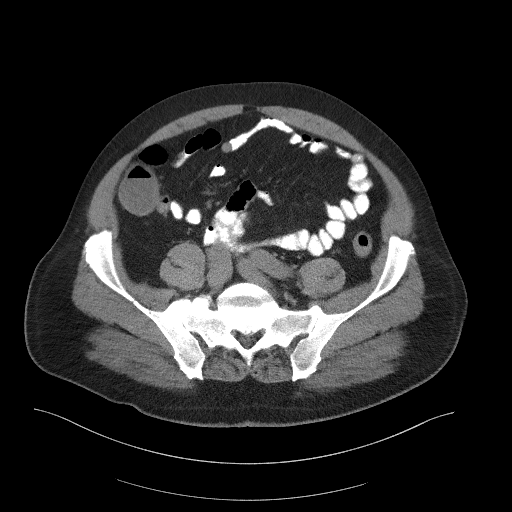
[im 49/97  soft-tissue]
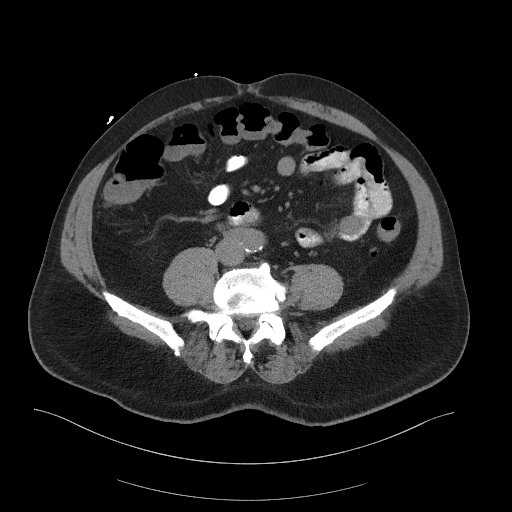
[im 56/97  soft-tissue]
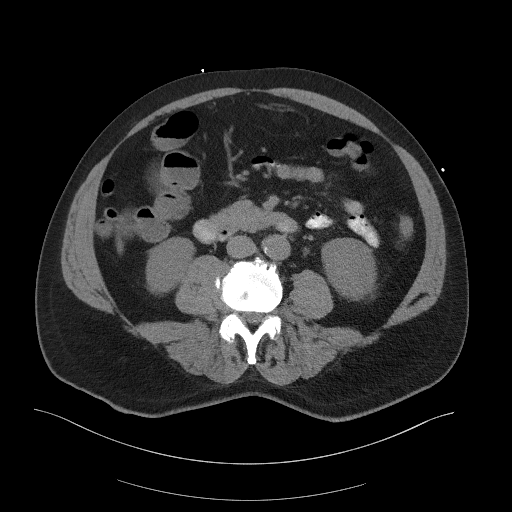
[im 63/97  soft-tissue]
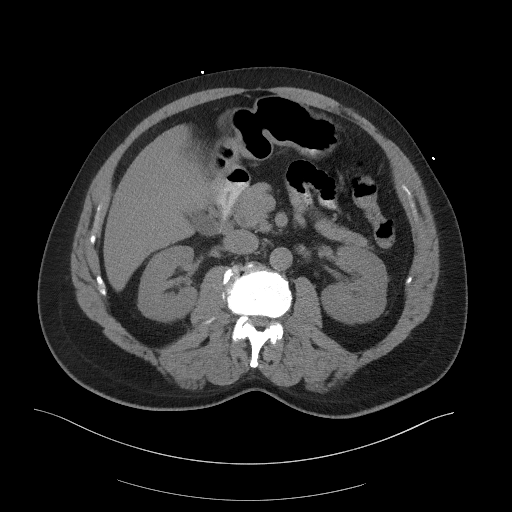
[im 63/97  bone]
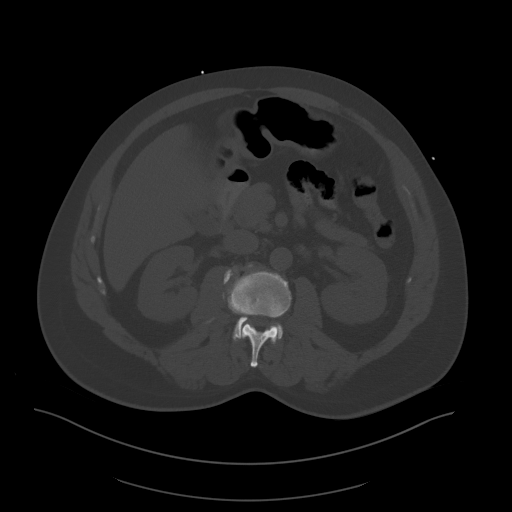
[im 71/97  soft-tissue]
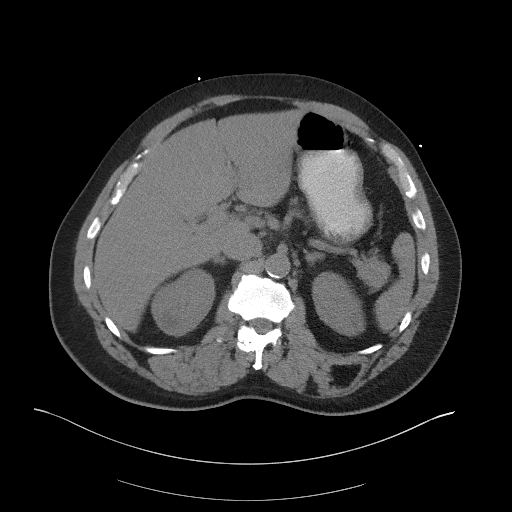
[im 78/97  soft-tissue]
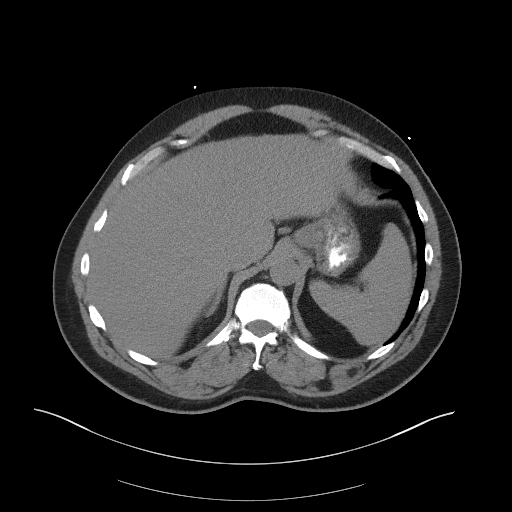
[im 85/97  soft-tissue]
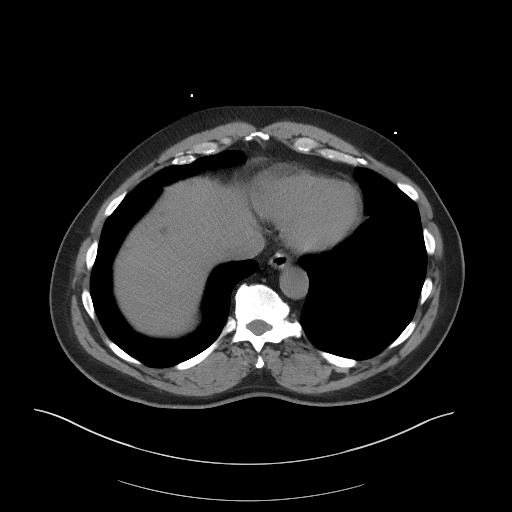
[im 93/97  soft-tissue]
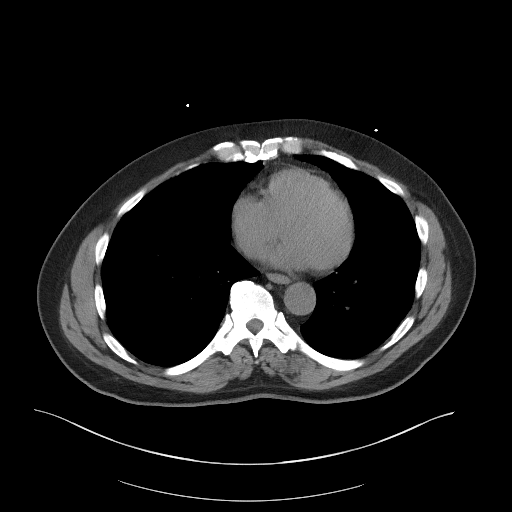

[Series 5: coronal st · coronal · 0.94mm/px · 3 of 125 slices shown]
[im 42/125  soft-tissue]
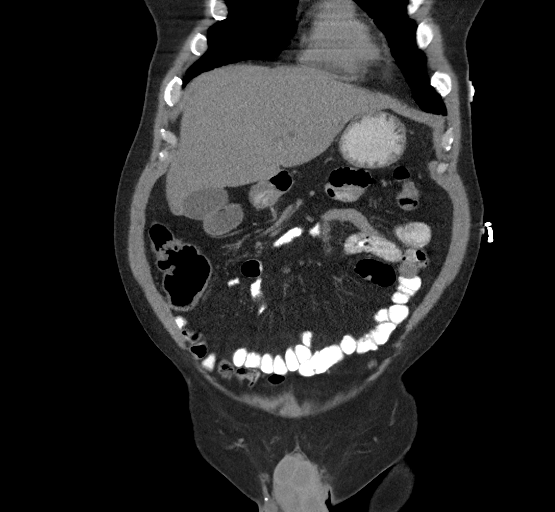
[im 56/125  soft-tissue]
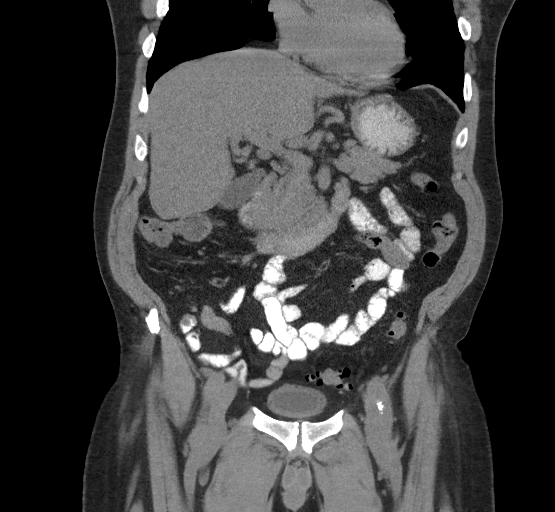
[im 69/125  soft-tissue]
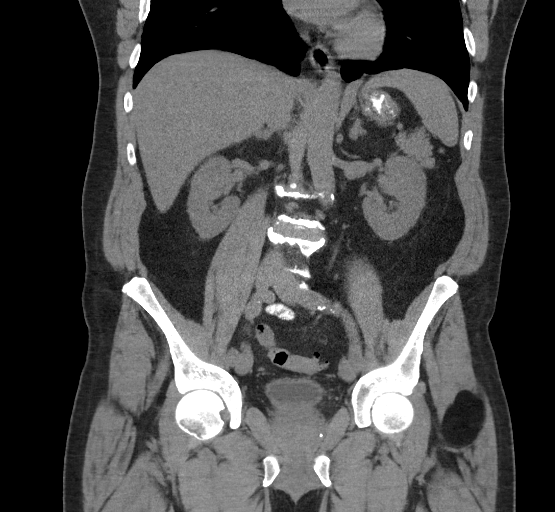

[16 of 46 positions shown; findings below may reference images not displayed]

FINDINGS: Lower chest: No acute abnormality.

Hepatobiliary: 2 benign-appearing liver cysts. Normal liver contour.
Normal gallbladder.

Pancreas: Unremarkable. No pancreatic ductal dilatation or
surrounding inflammatory changes.

Spleen: Normal in size without focal abnormality.

Adrenals/Urinary Tract: Normal adrenal glands. Normal appearance of
the left kidney. Circumscribed hypoattenuated lesion in the upper
pole of the right kidney measures 2.7 cm. A smaller difficult to
delineate water density lesion is seen medially to it.

Stomach/Bowel: Stomach is within normal limits. Appendix appears
normal. No evidence of small bowel wall thickening, distention, or
inflammatory changes. Left colonic diverticulosis with long segment
of wall thickening of the sigmoid colon, likely representing chronic
diverticulitis, as there no significant pericolonic mesenteric
changes.

Vascular/Lymphatic: Numerous shotty sub pathologic by CT criteria
upper abdominal retroperitoneal and porta hepatis lymph nodes.

Reproductive: Enlargement of the prostate gland, which measures
cm.

Other: No abdominal wall hernia or abnormality. No abdominopelvic
ascites.

Musculoskeletal: Multilevel osteoarthritic changes of the visualized
spine with associated posterior facet arthropathy.
IMPRESSION: Two benign-appearing liver cysts.

2 water density hypoattenuated lesions in the right kidney, probably
also representing cysts.

Number shotty sub pathologic by CT criteria upper abdominal
retroperitoneal lymph nodes.

Left colonic diverticulosis with probable chronic sigmoid
diverticulitis.

Enlargement of the prostate gland. Please correlate to serum PSA
values.

## 2018-11-12 ENCOUNTER — Ambulatory Visit (INDEPENDENT_AMBULATORY_CARE_PROVIDER_SITE_OTHER): Payer: Medicare HMO | Admitting: Internal Medicine

## 2018-11-18 ENCOUNTER — Telehealth (INDEPENDENT_AMBULATORY_CARE_PROVIDER_SITE_OTHER): Payer: Self-pay

## 2018-11-18 NOTE — Telephone Encounter (Signed)
Pt need to bring a [employer] bill [employer] ins card . It was not billed correctly to send fax to Rep for [employer] to help fix.

## 2018-11-20 ENCOUNTER — Ambulatory Visit: Payer: Medicare HMO | Admitting: Student

## 2018-12-18 ENCOUNTER — Ambulatory Visit: Payer: Medicare HMO | Admitting: Cardiology

## 2018-12-23 ENCOUNTER — Ambulatory Visit (INDEPENDENT_AMBULATORY_CARE_PROVIDER_SITE_OTHER): Payer: Medicare HMO | Admitting: Internal Medicine

## 2018-12-25 ENCOUNTER — Other Ambulatory Visit: Payer: Self-pay | Admitting: *Deleted

## 2018-12-25 DIAGNOSIS — Z20822 Contact with and (suspected) exposure to covid-19: Secondary | ICD-10-CM

## 2018-12-26 LAB — NOVEL CORONAVIRUS, NAA: SARS-CoV-2, NAA: NOT DETECTED

## 2018-12-30 ENCOUNTER — Ambulatory Visit: Payer: Self-pay

## 2018-12-30 NOTE — Telephone Encounter (Signed)
Provided covid -19 result  To Patient. Voiced understanding.

## 2019-01-08 ENCOUNTER — Ambulatory Visit (INDEPENDENT_AMBULATORY_CARE_PROVIDER_SITE_OTHER): Payer: Medicare HMO | Admitting: Internal Medicine

## 2019-01-09 ENCOUNTER — Ambulatory Visit: Payer: Medicare HMO | Admitting: Cardiology

## 2019-01-28 ENCOUNTER — Ambulatory Visit (INDEPENDENT_AMBULATORY_CARE_PROVIDER_SITE_OTHER): Payer: Medicare HMO | Admitting: Internal Medicine

## 2019-01-30 ENCOUNTER — Ambulatory Visit (INDEPENDENT_AMBULATORY_CARE_PROVIDER_SITE_OTHER): Payer: Medicare HMO | Admitting: Internal Medicine

## 2019-01-31 ENCOUNTER — Telehealth: Payer: Self-pay | Admitting: Cardiology

## 2019-01-31 NOTE — Telephone Encounter (Signed)
Returned pt call. He states that he is having some issues that he can wait until his virtual visit to discuss with Dr. Harl Bowie.

## 2019-01-31 NOTE — Telephone Encounter (Signed)
Pt called stating he is having some left arm pain and chest pain when he lays down, stated it was not constant but off and on.   Please call [phone]

## 2019-02-04 ENCOUNTER — Telehealth: Payer: Medicare HMO | Admitting: Cardiology

## 2019-02-04 NOTE — Progress Notes (Unsigned)
Opened in error, patient did not answer phone for virtual visit after multiple attempts   Zandra Abts MD

## 2019-02-05 DIAGNOSIS — H5200 Hypermetropia, unspecified eye: Secondary | ICD-10-CM | POA: Diagnosis not present

## 2019-02-10 ENCOUNTER — Ambulatory Visit (INDEPENDENT_AMBULATORY_CARE_PROVIDER_SITE_OTHER): Payer: Medicare HMO | Admitting: Internal Medicine

## 2019-02-15 ENCOUNTER — Other Ambulatory Visit: Payer: Self-pay | Admitting: Cardiology

## 2019-03-06 ENCOUNTER — Telehealth (INDEPENDENT_AMBULATORY_CARE_PROVIDER_SITE_OTHER): Payer: Self-pay

## 2019-03-06 DIAGNOSIS — M542 Cervicalgia: Secondary | ICD-10-CM

## 2019-03-06 NOTE — Telephone Encounter (Signed)
Okay, that is fine.  Please refer him to Dr. Arnoldo Morale.

## 2019-03-10 ENCOUNTER — Telehealth (INDEPENDENT_AMBULATORY_CARE_PROVIDER_SITE_OTHER): Payer: Self-pay

## 2019-03-10 NOTE — Telephone Encounter (Signed)
Can you please deal with this?  Thanks!

## 2019-03-10 NOTE — Telephone Encounter (Signed)
Patient scheduled.

## 2019-03-10 NOTE — Telephone Encounter (Signed)
I believe I talked to karen about this earlier today, but he will need to be seen either by video or by in-person (I would prefer in-person), prior to me prescribing an antibiotic. Please try to get him scheduled for a visit with me or Dr. Darnell Level prior to 1/28. If absolutely necessary he can be scheduled with me prior to 8:40am.

## 2019-03-10 NOTE — Telephone Encounter (Signed)
Patient called stating he was referred to Dr.Jenkins office but they told him he would need an antibiotic prior to being seen in their office. Would like a call back.

## 2019-03-13 ENCOUNTER — Encounter (INDEPENDENT_AMBULATORY_CARE_PROVIDER_SITE_OTHER): Payer: Self-pay | Admitting: Nurse Practitioner

## 2019-03-13 ENCOUNTER — Ambulatory Visit (INDEPENDENT_AMBULATORY_CARE_PROVIDER_SITE_OTHER): Payer: Medicare Other | Admitting: Nurse Practitioner

## 2019-03-13 ENCOUNTER — Other Ambulatory Visit: Payer: Self-pay

## 2019-03-13 VITALS — BP 133/70 | HR 79 | Temp 97.3°F | Resp 18 | Ht 72.0 in | Wt 266.0 lb

## 2019-03-13 DIAGNOSIS — M542 Cervicalgia: Secondary | ICD-10-CM | POA: Diagnosis not present

## 2019-03-13 MED ORDER — TIZANIDINE HCL 2 MG PO CAPS: 2.0000 mg | ORAL_CAPSULE | Freq: Two times a day (BID) | ORAL | 0 refills | Status: DC | PRN

## 2019-03-13 MED ORDER — PREDNISONE 20 MG PO TABS: 40.0000 mg | ORAL_TABLET | Freq: Every day | ORAL | 0 refills | Status: AC

## 2019-03-13 NOTE — Progress Notes (Signed)
Subjective:  Patient ID: Nathaniel Kline, male    DOB: Oct 14, 1950  Age: 69 y.o. MRN: [id number]  CC:  Chief Complaint  Patient presents with  . Follow-up      HPI  This patient arrives today for an acute visit for the above.  He tells me that approximately 1 month ago he started experiencing some right shoulder and neck pain.  He tells me it is getting progressively worse.  He describes it as a tingling at times, and like a (crick in the neck).  He says the pain is about a 5-6 out of 10 in intensity.  He has used icy hot which helps minimally and heating pad which also helps.  Movement makes the pain worse.  He denies any numbness or weakness in his arms.  He does mention that he has a mass on his right shoulder that has been present for many months and has been evaluated by general surgeon.  He tells me this mass is not grown or changed in any way, however he is experiencing pain in that area.   Past Medical History:  Diagnosis Date  . Alcohol abuse   . HLD (hyperlipidemia)   . HTN (hypertension)       Family History  Problem Relation Age of Onset  . Hypertension Other        family hx  . Diabetes Mother   . High blood pressure Mother   . High blood pressure Sister   . High blood pressure Brother   . Cancer Brother   . High blood pressure Sister     Social History   Social History Narrative   Disabled, does not get regular exercise.    Social History   Tobacco Use  . Smoking status: Former Smoker    Packs/day: 0.50    Years: 19.00    Pack years: 9.50    Types: Cigarettes    Start date: 08/21/1974    Quit date: 08/20/1993    Years since quitting: 25.5  . Smokeless tobacco: Never Used  . Tobacco comment: tobacco use - no  Substance Use Topics  . Alcohol use: Yes    Alcohol/week: 4.0 standard drinks    Types: 4 Shots of liquor per week    Comment: 2-3 fifths/day     Current Meds  Medication Sig  . amLODipine (NORVASC) 10 MG tablet TAKE 1 TABLET BY MOUTH  EVERY DAY  . aspirin 81 MG EC tablet Take 81 mg by mouth daily.    . diphenhydrAMINE (BENADRYL) 25 mg capsule Take 25 mg by mouth every 6 (six) hours as needed for itching or allergies.  . Multiple Vitamin (MULTIVITAMIN) tablet Take 1 tablet by mouth daily.    . vitamin C (ASCORBIC ACID) 500 MG tablet Take 500 mg by mouth daily.    ROS:  Review of Systems  Constitutional: Negative for fever and malaise/fatigue.  Eyes: Negative for blurred vision and double vision.  Respiratory: Negative for cough, shortness of breath and wheezing.   Cardiovascular: Negative for chest pain and palpitations.  Musculoskeletal: Positive for neck pain. Negative for back pain.  Neurological: Positive for tingling. Negative for sensory change and weakness.     Objective:   Today's Vitals: BP 133/70 (BP Location: Left Arm, Patient Position: Sitting, Cuff Size: Normal)   Pulse 79   Temp (!) 97.3 F (36.3 C) (Temporal)   Resp 18   Ht 6' (1.829 m)   Wt 266 lb (120.7 kg)  HC 78" (198.1 cm)   SpO2 98%   BMI 36.08 kg/m  Vitals with BMI 03/13/2019 04/24/2018 04/04/2018  Height 6\' 0"  - -  Weight 266 lbs - 274 lbs 3 oz  BMI [id number] - -  Systolic [id number]  Diastolic 70 92 89  Pulse 79 81 94     Physical Exam Vitals reviewed.  Constitutional:      Appearance: Normal appearance.  HENT:     Head: Normocephalic and atraumatic.  Cardiovascular:     Rate and Rhythm: Normal rate and regular rhythm.  Pulmonary:     Effort: Pulmonary effort is normal.     Breath sounds: Normal breath sounds.  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 Normal strength.     Left shoulder: Normal. Normal strength.     Cervical back: Normal and neck supple.  Skin:    General: Skin is warm and dry.  Neurological:     Mental Status: He is alert and oriented to person, place, and time.  Psychiatric:        Mood and Affect: Mood normal.        Behavior:  Behavior normal.        Thought Content: Thought content normal.        Judgment: Judgment normal.          Assessment   No diagnosis found.    Tests ordered No orders of the defined types were placed in this encounter.    Plan: Please see assessment and plan per problem list below.   No orders of the defined types were placed in this encounter.   Patient to follow-up in 6 weeks.  Ailene Ards, NP

## 2019-03-13 NOTE — Patient Instructions (Signed)
Thank you for choosing [facility] as your medical provider! If you have any questions or concerns regarding your health care, please do not hesitate to call our office.  I believe your pain may be related to a muscle spasm.  I have prescribed you a 5-day course of prednisone.  Take 2 tablets by mouth daily with breakfast for 5 days then stop.  I have also prescribed you a mild muscle relaxer that you can take by mouth twice a day as needed.  As we discussed please try this for the first time when you have a few hours at home to make sure you know how you feel.  Try to avoid driving or operating any heavy machinery while taking this medication.  You may also take over-the-counter Tylenol as needed for pain relief.  Hold off on taking any Advil or ibuprofen until after you have completed your prednisone.  If you start to experience any abdominal pain while on the prednisone stop the prednisone.  If you experience any weakness, numbness in your arms please let us know.  If you experience swelling, redness, worsening tenderness to touch, or drainage from the area over your right shoulder please let us know.  Please follow-up as scheduled in 6 weeks. We look forward to seeing you again soon!  At [facility] we value your feedback. You may receive a survey about your visit today. Please share your experience as we strive to create trusting relationships with our patients to provide genuine, compassionate, quality care.  We appreciate your understanding and patience as we review any laboratory studies, imaging, and other diagnostic tests that are ordered as we care for you. We do our best to address any and all results in a timely manner. If you do not hear about test results within 1 week, please do not hesitate to contact us. If we referred you to a specialist during your visit or ordered imaging testing, contact the office if you have not been contacted to be scheduled within 1  weeks.  We also encourage the use of MyChart, which contains your medical information for your review as well. If you are not enrolled in this feature, an access code is on this after visit summary for your convenience. Thank you for allowing [place] to be involved in your care.

## 2019-03-13 NOTE — Assessment & Plan Note (Signed)
Patient tells me he has an appointment scheduled to be evaluated by general surgery for possible abscess of the area.  Per my exam I do not note any signs of infection over the area of pain.  I think you is experiencing muscle spasms.  For now I will treat him for this with prednisone and a mild muscle relaxer.  I did discuss signs and symptoms associated with prednisone and what to do if he experiences these.  I also discussed bleeding precautions and to avoid ibuprofen or Advil in conjunction with his prednisone.  I will have him return in 6 weeks to see if his symptoms have improved.  If they have not may need to consider imaging of the cervical spine.  Currently he is not experiencing any neurological symptoms that would be worrisome.  I encouraged him to let us know if symptoms worsen or change between now and his next appointment.  He tells me he understands.  I also gave him a list of red flag symptoms that would warrant evaluation sooner than currently scheduled.

## 2019-03-20 ENCOUNTER — Ambulatory Visit: Payer: Medicare HMO | Admitting: General Surgery

## 2019-03-20 ENCOUNTER — Other Ambulatory Visit (INDEPENDENT_AMBULATORY_CARE_PROVIDER_SITE_OTHER): Payer: Self-pay | Admitting: Nurse Practitioner

## 2019-03-20 ENCOUNTER — Telehealth (INDEPENDENT_AMBULATORY_CARE_PROVIDER_SITE_OTHER): Payer: Self-pay

## 2019-03-20 DIAGNOSIS — M542 Cervicalgia: Secondary | ICD-10-CM

## 2019-03-20 DIAGNOSIS — M25511 Pain in right shoulder: Secondary | ICD-10-CM

## 2019-03-20 NOTE — Telephone Encounter (Signed)
Pt is calling to advise that his shoulders are still hurting, not as intense but still pretty bad. He states that Judson Roch told him to call back if no better.

## 2019-03-24 NOTE — Telephone Encounter (Signed)
Please call this patient and ask if you would like me to send him to an orthopedist for further evaluation and management of his shoulder pain.  If he would like this referral please let me know and I will order it.

## 2019-03-25 ENCOUNTER — Other Ambulatory Visit (INDEPENDENT_AMBULATORY_CARE_PROVIDER_SITE_OTHER): Payer: Self-pay

## 2019-03-25 DIAGNOSIS — M542 Cervicalgia: Secondary | ICD-10-CM

## 2019-03-25 DIAGNOSIS — M25519 Pain in unspecified shoulder: Secondary | ICD-10-CM

## 2019-03-25 NOTE — Addendum Note (Signed)
Addended by: Ailene Ards on: 03/25/2019 05:28 PM   Modules accepted: Orders

## 2019-03-25 NOTE — Telephone Encounter (Signed)
Referral ordered

## 2019-03-25 NOTE — Telephone Encounter (Signed)
Put in order to be seen by Dr Aline Brochure for shoulder pain.

## 2019-03-25 NOTE — Progress Notes (Signed)
Pt would like to be seen by a orthopedic in [place] area.

## 2019-03-27 ENCOUNTER — Telehealth (INDEPENDENT_AMBULATORY_CARE_PROVIDER_SITE_OTHER): Payer: Self-pay

## 2019-03-27 ENCOUNTER — Ambulatory Visit (HOSPITAL_COMMUNITY)
Admission: RE | Admit: 2019-03-27 | Discharge: 2019-03-27 | Disposition: A | Discharge status: Home / Self Care | Payer: Medicare Other | Source: Ambulatory Visit | Attending: Nurse Practitioner | Admitting: Nurse Practitioner

## 2019-03-27 ENCOUNTER — Other Ambulatory Visit (INDEPENDENT_AMBULATORY_CARE_PROVIDER_SITE_OTHER): Payer: Self-pay | Admitting: Nurse Practitioner

## 2019-03-27 ENCOUNTER — Telehealth: Payer: Self-pay | Admitting: Orthopedic Surgery

## 2019-03-27 ENCOUNTER — Other Ambulatory Visit: Payer: Self-pay

## 2019-03-27 DIAGNOSIS — M25511 Pain in right shoulder: Secondary | ICD-10-CM

## 2019-03-27 MED ORDER — IBUPROFEN 400 MG PO TABS: 400.0000 mg | ORAL_TABLET | Freq: Three times a day (TID) | ORAL | 0 refills | Status: DC | PRN

## 2019-03-27 NOTE — Telephone Encounter (Signed)
I called patient upon returning to p.m[facility]clinic per Wendy's response to offer appointment.  Patient states he is at [facility] radiology now for CT scan ordered by primary care provider.  Michela Pitcher he will call back to discuss appointment. Updated (newly received) referral accordingly. (See referral workqueue for all information.)

## 2019-03-27 NOTE — Telephone Encounter (Signed)
Patient called to inquire about scheduling an appointment with Dr Aline Brochure, whom he has seen in past, due to a neck problem; states he thought he had a referral to our office for this problem. States he is in a lot of pain.  Chart notes indicate his referral to our clinic was cancelled and referred to Dr Arnoldo Morale, general surgeon.  Please advise; patient is eager to hear something back.

## 2019-03-27 NOTE — Telephone Encounter (Signed)
Return call to Nathaniel Kline. Gave instruction to go get x-ray of rt shoulder. Then he will go get the pharmacy to pickup medication.

## 2019-03-27 NOTE — Telephone Encounter (Signed)
I called the patient back.  I will order x-ray of the right shoulder and I have sent a prescription for some more medication.  I am not sure if this would be a walk-in at any pain or not, but when you have time will you please call him regarding scheduling this x-ray?  Thank you.  I will believe the order on your desk.  It is for the right shoulder.

## 2019-03-27 NOTE — Progress Notes (Addendum)
I called this patient back today.  He tells me that he continues to have right shoulder pain.  He did undergo a course of prednisone as well as as needed muscle relaxers.  I did suggest referral to orthopedist Dr. But he has canceled this appointment due to financial barriers he tells me.  He would like imaging done of his right shoulder.  I told him that I think he needs to be evaluated by an orthopedist, but I can send him for a right shoulder x-ray first if he would like.  I will also prescribe him ibuprofen that he can take as needed.  I did warn him about possible side effects of ibuprofen and recommended that he take this with food.  I do see that he has some renal insufficiency and so I will order him a lower dose but he can take every 8 hours as needed. Creatinine Clearance is aproximately 55 based on metabolic panel collected in 09/2018 (crt 1.76, age 69, height 6', weight 266lbs). He will follow-up with [place] as scheduled and I will await results of x-ray before making further recommendations.

## 2019-03-27 NOTE — Telephone Encounter (Signed)
Per chart note he needs to be seen by ortho for right shoulder pain, and ref MD office has ordered an xray of right shld.  Please call him to schedule the appt, thanks.

## 2019-03-28 ENCOUNTER — Other Ambulatory Visit (INDEPENDENT_AMBULATORY_CARE_PROVIDER_SITE_OTHER): Payer: Self-pay | Admitting: Nurse Practitioner

## 2019-03-28 DIAGNOSIS — M25511 Pain in right shoulder: Secondary | ICD-10-CM

## 2019-04-02 ENCOUNTER — Ambulatory Visit: Payer: Medicare Other | Admitting: Orthopedic Surgery

## 2019-04-03 ENCOUNTER — Other Ambulatory Visit (INDEPENDENT_AMBULATORY_CARE_PROVIDER_SITE_OTHER): Payer: Self-pay | Admitting: Internal Medicine

## 2019-04-03 DIAGNOSIS — M542 Cervicalgia: Secondary | ICD-10-CM

## 2019-04-10 ENCOUNTER — Ambulatory Visit: Payer: Medicare Other | Admitting: General Surgery

## 2019-04-11 ENCOUNTER — Emergency Department (HOSPITAL_COMMUNITY): Payer: Medicare Other

## 2019-04-11 ENCOUNTER — Encounter (HOSPITAL_COMMUNITY): Payer: Self-pay | Admitting: Emergency Medicine

## 2019-04-11 ENCOUNTER — Emergency Department (HOSPITAL_COMMUNITY)
Admission: EM | Admit: 2019-04-11 | Discharge: 2019-04-12 | Disposition: A | Discharge status: Home / Self Care | Payer: Medicare Other | Attending: Emergency Medicine | Admitting: Emergency Medicine

## 2019-04-11 ENCOUNTER — Other Ambulatory Visit: Payer: Self-pay

## 2019-04-11 DIAGNOSIS — Z79899 Other long term (current) drug therapy: Secondary | ICD-10-CM | POA: Diagnosis not present

## 2019-04-11 DIAGNOSIS — Y999 Unspecified external cause status: Secondary | ICD-10-CM | POA: Diagnosis not present

## 2019-04-11 DIAGNOSIS — W000XXA Fall on same level due to ice and snow, initial encounter: Secondary | ICD-10-CM | POA: Insufficient documentation

## 2019-04-11 DIAGNOSIS — S43012A Anterior subluxation of left humerus, initial encounter: Secondary | ICD-10-CM | POA: Insufficient documentation

## 2019-04-11 DIAGNOSIS — I10 Essential (primary) hypertension: Secondary | ICD-10-CM | POA: Diagnosis not present

## 2019-04-11 DIAGNOSIS — Y929 Unspecified place or not applicable: Secondary | ICD-10-CM | POA: Insufficient documentation

## 2019-04-11 DIAGNOSIS — Z87891 Personal history of nicotine dependence: Secondary | ICD-10-CM | POA: Insufficient documentation

## 2019-04-11 DIAGNOSIS — Y939 Activity, unspecified: Secondary | ICD-10-CM | POA: Diagnosis not present

## 2019-04-11 DIAGNOSIS — I2510 Atherosclerotic heart disease of native coronary artery without angina pectoris: Secondary | ICD-10-CM | POA: Insufficient documentation

## 2019-04-11 DIAGNOSIS — S43005A Unspecified dislocation of left shoulder joint, initial encounter: Secondary | ICD-10-CM | POA: Diagnosis not present

## 2019-04-11 DIAGNOSIS — Z7982 Long term (current) use of aspirin: Secondary | ICD-10-CM | POA: Insufficient documentation

## 2019-04-11 DIAGNOSIS — S42202A Unspecified fracture of upper end of left humerus, initial encounter for closed fracture: Secondary | ICD-10-CM | POA: Diagnosis not present

## 2019-04-11 DIAGNOSIS — S4992XA Unspecified injury of left shoulder and upper arm, initial encounter: Secondary | ICD-10-CM | POA: Diagnosis present

## 2019-04-11 DIAGNOSIS — S43015A Anterior dislocation of left humerus, initial encounter: Secondary | ICD-10-CM | POA: Diagnosis not present

## 2019-04-11 LAB — CBC WITH DIFFERENTIAL/PLATELET
Abs Immature Granulocytes: 0.02 10*3/uL (ref 0.00–0.07)
Basophils Absolute: 0.1 10*3/uL (ref 0.0–0.1)
Basophils Relative: 1 %
Eosinophils Absolute: 0.2 10*3/uL (ref 0.0–0.5)
Eosinophils Relative: 2 %
HCT: 44.3 % (ref 39.0–52.0)
Hemoglobin: 14 g/dL (ref 13.0–17.0)
Immature Granulocytes: 0 %
Lymphocytes Relative: 23 %
Lymphs Abs: 2.2 10*3/uL (ref 0.7–4.0)
MCH: 33.4 pg (ref 26.0–34.0)
MCHC: 31.6 g/dL (ref 30.0–36.0)
MCV: 105.7 fL — ABNORMAL HIGH (ref 80.0–100.0)
Monocytes Absolute: 1 10*3/uL (ref 0.1–1.0)
Monocytes Relative: 11 %
Neutro Abs: 6.3 10*3/uL (ref 1.7–7.7)
Neutrophils Relative %: 63 %
Platelets: 252 10*3/uL (ref 150–400)
RBC: 4.19 MIL/uL — ABNORMAL LOW (ref 4.22–5.81)
RDW: 12.3 % (ref 11.5–15.5)
WBC: 9.7 10*3/uL (ref 4.0–10.5)
nRBC: 0 % (ref 0.0–0.2)

## 2019-04-11 LAB — COMPREHENSIVE METABOLIC PANEL
ALT: 80 U/L — ABNORMAL HIGH (ref 0–44)
AST: 64 U/L — ABNORMAL HIGH (ref 15–41)
Albumin: 3.9 g/dL (ref 3.5–5.0)
Alkaline Phosphatase: 41 U/L (ref 38–126)
Anion gap: 10 (ref 5–15)
BUN: 18 mg/dL (ref 8–23)
CO2: 26 mmol/L (ref 22–32)
Calcium: 9.6 mg/dL (ref 8.9–10.3)
Chloride: 100 mmol/L (ref 98–111)
Creatinine, Ser: 1.36 mg/dL — ABNORMAL HIGH (ref 0.61–1.24)
GFR calc Af Amer: >60 mL/min (ref >60)
GFR calc non Af Amer: 53 mL/min — ABNORMAL LOW (ref >60)
Glucose, Bld: 96 mg/dL (ref 70–99)
Potassium: 4.3 mmol/L (ref 3.5–5.1)
Sodium: 136 mmol/L (ref 135–145)
Total Bilirubin: 0.5 mg/dL (ref 0.3–1.2)
Total Protein: 7.8 g/dL (ref 6.5–8.1)

## 2019-04-11 MED ORDER — FENTANYL CITRATE (PF) 100 MCG/2ML IJ SOLN
50.0000 ug | Freq: Once | INTRAMUSCULAR | Status: AC
  Administered 2019-04-11: 50 ug via INTRAVENOUS
  Filled 2019-04-11: qty 2

## 2019-04-11 MED ORDER — HYDROMORPHONE HCL 1 MG/ML IJ SOLN
1.0000 mg | Freq: Once | INTRAMUSCULAR | Status: AC
  Administered 2019-04-11: 1 mg via INTRAVENOUS
  Filled 2019-04-11: qty 1

## 2019-04-11 MED ORDER — PROPOFOL 10 MG/ML IV BOLUS
1.0000 mg/kg | Freq: Once | INTRAVENOUS | Status: AC
  Administered 2019-04-11: 117.9 mg via INTRAVENOUS
  Filled 2019-04-11: qty 20

## 2019-04-11 MED ORDER — KETAMINE HCL 10 MG/ML IJ SOLN
1.0000 mg/kg | Freq: Once | INTRAMUSCULAR | Status: AC
  Administered 2019-04-12: 100 mg via INTRAVENOUS
  Filled 2019-04-11: qty 1

## 2019-04-11 MED ORDER — FENTANYL CITRATE (PF) 100 MCG/2ML IJ SOLN
75.0000 ug | Freq: Once | INTRAMUSCULAR | Status: AC
  Administered 2019-04-11: 75 ug via INTRAVENOUS
  Filled 2019-04-11: qty 2

## 2019-04-11 MED ORDER — PROPOFOL 10 MG/ML IV BOLUS
1.0000 mg/kg | Freq: Once | INTRAVENOUS | Status: AC
  Administered 2019-04-12: 50 mg via INTRAVENOUS
  Filled 2019-04-11: qty 20

## 2019-04-11 MED ORDER — PROPOFOL 500 MG/50ML IV EMUL: INTRAVENOUS | Status: AC | PRN

## 2019-04-11 NOTE — ED Notes (Signed)
PALS line paging Orthopedic Surgeon

## 2019-04-11 NOTE — ED Triage Notes (Signed)
Patient fell last Saturday and is experiencing left shoulder pain.

## 2019-04-11 NOTE — ED Provider Notes (Signed)
[facility] EMERGENCY DEPARTMENT Provider Note   CSN: [id number] Arrival date & time: 04/11/19  1735     History Chief Complaint  Patient presents with  . Shoulder Pain     Nathaniel Kline is a 69 y.o. male with PMHx HTN, HLD, and alcohol abuse who presents to the ED today complaining of sudden onset, constant, achy, L shoulder pain s/p mechanical fall that occurred 6 days ago.  Patient reports that he slipped on the ice outside 6 days ago and attempted to catch himself on the railing of his porch outside with his left arm.  States he felt immediate pain in that left shoulder. No head injury or LOC. He states that he thought it was getting mildly better throughout the week however states that it is not gone away prompting him to come to the ED today.  Patient states he feels like it may be out of place, he has never had a shoulder dislocation in the past.  He is also complaining of some swelling to that left arm that started approximately 3 days ago with paresthesias to the medial aspect of the fifth digit up into about the elbow region.  Denies fevers, chills, weakness, numbness, any other associated symptoms.  The history is provided by the patient and medical records.       Past Medical History:  Diagnosis Date  . Alcohol abuse   . HLD (hyperlipidemia)   . HTN (hypertension)     Patient Active Problem List   Diagnosis Date Noted  . Neck pain 03/13/2019  . Back pain 07/08/2013  . CAD, NATIVE VESSEL 09/07/2009  . HLD (hyperlipidemia) 11/26/2008  . ALCOHOL ABUSE 11/26/2008  . Essential hypertension 11/26/2008    Past Surgical History:  Procedure Laterality Date  . 2 caths  10/21/05   caridac cath       Family History  Problem Relation Age of Onset  . Hypertension Other        family hx  . Diabetes Mother   . High blood pressure Mother   . High blood pressure Sister   . High blood pressure Brother   . Cancer Brother   . High blood pressure Sister     Social History    Tobacco Use  . Smoking status: Former Smoker    Packs/day: 0.50    Years: 19.00    Pack years: 9.50    Types: Cigarettes    Start date: 08/21/1974    Quit date: 08/20/1993    Years since quitting: 25.6  . Smokeless tobacco: Never Used  . Tobacco comment: tobacco use - no  Substance Use Topics  . Alcohol use: Yes    Alcohol/week: 4.0 standard drinks    Types: 4 Shots of liquor per week    Comment: 2-3 fifths/day  . Drug use: No    Home Medications Prior to Admission medications   Medication Sig Start Date End Date Taking? Authorizing Provider  amLODipine (NORVASC) 10 MG tablet TAKE 1 TABLET BY MOUTH EVERY DAY 02/17/19   Arnoldo Lenis, MD  aspirin 81 MG EC tablet Take 81 mg by mouth daily.      [provider]  diphenhydrAMINE (BENADRYL) 25 mg capsule Take 25 mg by mouth every 6 (six) hours as needed for itching or allergies.    [provider]  HYDROcodone-acetaminophen (NORCO/VICODIN) 5-325 MG tablet Take 1 tablet by mouth every 6 (six) hours as needed for severe pain. 04/12/19   Eustaquio Maize, PA-C  ibuprofen (ADVIL) 400 MG tablet Take 1 tablet (400 mg total) by mouth every 8 (eight) hours as needed for mild pain or moderate pain. 03/27/19   Ailene Ards, NP  Multiple Vitamin (MULTIVITAMIN) tablet Take 1 tablet by mouth daily.      [provider]  tizanidine (ZANAFLEX) 2 MG capsule TAKE 1 CAPSULE (2 MG TOTAL) BY MOUTH 2 (TWO) TIMES DAILY AS NEEDED FOR MUSCLE SPASMS. 04/03/19   Doree Albee, MD  vitamin C (ASCORBIC ACID) 500 MG tablet Take 500 mg by mouth daily.    [provider]    Allergies    Other  Review of Systems   Review of Systems  Constitutional: Negative for chills and fever.  Musculoskeletal: Positive for arthralgias and joint swelling.  Neurological: Negative for headaches.       + paresthesias  All other systems reviewed and are negative.   Physical Exam Updated Vital Signs BP (!) 168/102 (BP Location: Right  Arm)   Pulse 79   Temp 98.4 F (36.9 C) (Oral)   Resp 18   Ht 6\' 6"  (1.981 m)   Wt 117.9 kg   SpO2 98%   BMI 30.05 kg/m   Physical Exam Vitals and nursing note reviewed.  Constitutional:      Appearance: He is not ill-appearing or diaphoretic.  HENT:     Head: Normocephalic and atraumatic.  Eyes:     Conjunctiva/sclera: Conjunctivae normal.  Cardiovascular:     Rate and Rhythm: Normal rate and regular rhythm.     Pulses: Normal pulses.  Pulmonary:     Effort: Pulmonary effort is normal.     Breath sounds: Normal breath sounds. No wheezing, rhonchi or rales.  Abdominal:     Palpations: Abdomen is soft.     Tenderness: There is no abdominal tenderness. There is no guarding or rebound.  Musculoskeletal:     Cervical back: Neck supple.     Comments: Depression noted to L shoulder with TTP diffusely. Obvious increased edema to left arm compared to right. ROM of shoulder limited due to pain. Strength 5/5 with grip strength. Able to give thumbs up. Able to oppose thumb. 2+ radial pulse.   Skin:    General: Skin is warm and dry.  Neurological:     Mental Status: He is alert.     ED Results / Procedures / Treatments   Labs (all labs ordered are listed, but only abnormal results are displayed) Labs Reviewed  COMPREHENSIVE METABOLIC PANEL - Abnormal; Notable for the following components:      Result Value   Creatinine, Ser 1.36 (*)    AST 64 (*)    ALT 80 (*)    GFR calc non Af Amer 53 (*)    All other components within normal limits  CBC WITH DIFFERENTIAL/PLATELET - Abnormal; Notable for the following components:   RBC 4.19 (*)    MCV 105.7 (*)    All other components within normal limits    EKG None  Radiology DG Chest Port 1 View  Result Date: 04/11/2019 CLINICAL DATA:  Preop. Left shoulder dislocation. EXAM: PORTABLE CHEST 1 VIEW COMPARISON:  Shoulder radiograph earlier this day. Chest radiograph 05/10/2017 FINDINGS: Unchanged heart size and mediastinal contours.  No focal airspace disease. No pulmonary edema or pleural fluid. No pneumothorax. Left shoulder dislocation, characterized on shoulder radiograph earlier today. No acute rib fracture. IMPRESSION: No acute pulmonary process. Left shoulder dislocation. Electronically Signed   By: Aurther Loft.D.  On: 04/11/2019 22:53   DG Shoulder Left  Result Date: 04/11/2019 CLINICAL DATA:  Fall with shoulder pain EXAM: LEFT SHOULDER - 2+ VIEW COMPARISON:  None. FINDINGS: There is anterior dislocation of the left glenohumeral joint. On the Y-view, there is contour irregularity along the anterior margin of the humerus. Otherwise, no evidence of fracture. IMPRESSION: Anterior left glenohumeral joint dislocation. Possible Hill-Sachs fracture seen on a single view. Electronically Signed   By: Ulyses Jarred M.D.   On: 04/11/2019 19:49   DG Shoulder Left Portable  Result Date: 04/12/2019 CLINICAL DATA:  Post reduction EXAM: LEFT SHOULDER COMPARISON:  04/11/2019 FINDINGS: Humeral head has been reduced, now properly located within the glenoid. Shoulder is held in internal rotation. Hill Sachs fracture of the humeral head is evident. IMPRESSION: Humeral head relocated.  Hill Sachs fracture of the humerus. Electronically Signed   By: Nelson Chimes M.D.   On: 04/12/2019 00:59    Procedures Reduction of dislocation  Date/Time: 04/11/2019 11:17 PM Performed by: Eustaquio Maize, PA-C Authorized by: Eustaquio Maize, PA-C  Consent: Verbal consent obtained. Written consent obtained. Risks and benefits: risks, benefits and alternatives were discussed Consent given by: patient Local anesthesia used: no  Anesthesia: Local anesthesia used: no  Sedation: Patient sedated: yes Sedation type: moderate (conscious) sedation Sedatives: propofol Analgesia: fentanyl Sedation start date/time: 04/11/2019 9:30 PM Sedation end date/time: 04/11/2019 9:50 PM Vitals: Vital signs were monitored during sedation.  Comments: Unsuccessful  reduction    (including critical care time)  Medications Ordered in ED Medications  propofol (DIPRIVAN) 10 mg/mL bolus/IV push 117.9 mg (117.9 mg Intravenous Given 04/11/19 2135)  fentaNYL (SUBLIMAZE) injection 50 mcg (50 mcg Intravenous Given 04/11/19 2052)  propofol (DIPRIVAN) 500 MG/50ML infusion (117.9 mcg Intravenous Not Given 04/11/19 2135)  fentaNYL (SUBLIMAZE) injection 75 mcg (75 mcg Intravenous Given 04/11/19 2120)  HYDROmorphone (DILAUDID) injection 1 mg (1 mg Intravenous Given 04/11/19 2325)  propofol (DIPRIVAN) 10 mg/mL bolus/IV push 117.9 mg (50 mg Intravenous Given 04/12/19 0037)  ketamine (KETALAR) injection 118 mg (100 mg Intravenous Given 04/12/19 0038)    ED Course  I have reviewed the triage vital signs and the nursing notes.  Pertinent labs & imaging results that were available during my care of the patient were reviewed by me and considered in my medical decision making (see chart for details).  69 year old male who presents to the ED today complaining of sudden onset left shoulder pain status post mechanical fall that occurred 6 days ago while slipping on ice.  On arrival to the ED patient is afebrile, mildly tachycardic suspect due to pain, nontachypneic.  He has an obvious depression noted to the left shoulder with suspect for dislocation.  Will obtain x-ray at this time.  Denies any head injury or loss of consciousness.  Do not feel he needs additional images at this time.  He is neurovascularly intact.   Xray positive for anterior shoulder dislocation. Given it has been 6 days will perform conscious sedation. Fentanyl given for pain.   Conscious sedation performed with attending physician Dr. Lacinda Axon at bedside without success. Pt received weight limit of propofol without full adequate sedation. Given this will consult ortho surgery for further recommendations.   Discussed case with Dr. Lyla Glassing who does not typically reduce shoulders; recommends outpatient follow up with  [facility] as patient may need open reduction given it has been 6 days since onset. Will consult [facility] at this time.   Discussed case with Dr. Ysidro Evert who recommends ED to ED transfer  with plan for OR reduction sometime tomorrow. Will order pre op labs at this time - have been informed by nursing staff that they will need to do type and screen at [facility]. Had also discussed obtaining COVID test at [facility] per Dr. Ysidro Evert. Awaiting to hear back from ED physician to accept patient.   Dr. Kathrynn Humble arrived for overnight shift. Pt in agreement to attempt reduction one more time prior to being transferred. See sedation note.   Successfully reduced shoulder. Sling applied. [facility] made aware that pt no longer requires transfer. Will discharge at this time. Pt advised to follow up with ortho for further evaluation. Short course of pain medication given. Pt stable for discharge home.   This note was prepared using Dragon voice recognition software and may include unintentional dictation errors due to the inherent limitations of voice recognition software.      MDM Rules/Calculators/A&P                      Final Clinical Impression(s) / ED Diagnoses Final diagnoses:  Dislocation of left shoulder joint, initial encounter    Rx / DC Orders ED Discharge Orders         Ordered    HYDROcodone-acetaminophen (NORCO/VICODIN) 5-325 MG tablet  Every 6 hours PRN     04/12/19 0059           Discharge Instructions     We have successfully reduced your shoulder in the ED today Please call Dr. Aline Brochure to schedule an appointment for further evaluation outpatient Continue wearing your sling for comfort until you are evaluated by ortho. While at home you may apply ice to the area to help reduce pain and swelling I have prescribed a short course of pain medication for you to take as needed for severe pain. It is recommended to take 600-800 mg Ibuprofen first and then to take the narcotic medication for break through pain           Eustaquio Maize, PA-C 04/12/19 0109    Nat Christen, MD 04/12/19 [phone]

## 2019-04-12 ENCOUNTER — Emergency Department (HOSPITAL_COMMUNITY): Payer: Medicare Other

## 2019-04-12 DIAGNOSIS — S42202A Unspecified fracture of upper end of left humerus, initial encounter for closed fracture: Secondary | ICD-10-CM | POA: Diagnosis not present

## 2019-04-12 MED ORDER — HYDROCODONE-ACETAMINOPHEN 5-325 MG PO TABS: 1.0000 | ORAL_TABLET | Freq: Four times a day (QID) | ORAL | 0 refills | Status: DC | PRN

## 2019-04-12 NOTE — ED Notes (Signed)
AC notified of need for ketamine.

## 2019-04-12 NOTE — ED Provider Notes (Signed)
Physical Exam  BP 126/83   Pulse 65   Temp 98.4 F (36.9 C) (Oral)   Resp (!) 21   Ht 6\' 6"  (1.981 m)   Wt 117.9 kg   SpO2 98%   BMI 30.05 kg/m   Physical Exam  ED Course/Procedures     .Sedation  Date/Time: 04/12/2019 12:55 AM Performed by: Varney Biles, MD Authorized by: Varney Biles, MD   Consent:    Consent obtained:  Verbal and written   Consent given by:  Patient   Risks discussed:  Allergic reaction, dysrhythmia, inadequate sedation, nausea, prolonged hypoxia resulting in organ damage, prolonged sedation necessitating reversal, respiratory compromise necessitating ventilatory assistance and intubation and vomiting   Alternatives discussed:  Analgesia without sedation, anxiolysis and regional anesthesia Universal protocol:    Procedure explained and questions answered to patient or proxy's satisfaction: yes     Relevant documents present and verified: yes     Test results available and properly labeled: yes     Imaging studies available: yes     Required blood products, implants, devices, and special equipment available: yes     Site/side marked: yes     Immediately prior to procedure a time out was called: yes     Patient identity confirmation method:  Arm band Indications:    Procedure performed:  Dislocation reduction   Procedure necessitating sedation performed by:  Physician performing sedation Pre-sedation assessment:    Time since last food or drink:  > 4 hours   ASA classification: class 2 - patient with mild systemic disease     Neck mobility: normal     Mouth opening:  3 or more finger widths   Thyromental distance:  4 finger widths   Mallampati score:  I - soft palate, uvula, fauces, pillars visible   Pre-sedation assessments completed and reviewed: airway patency, cardiovascular function, hydration status, mental status, nausea/vomiting, pain level, respiratory function and temperature     Pre-sedation assessment completed:  04/12/2019 12:20  AM Immediate pre-procedure details:    Reassessment: Patient reassessed immediately prior to procedure     Reviewed: vital signs, relevant labs/tests and NPO status     Verified: bag valve mask available, emergency equipment available, intubation equipment available, IV patency confirmed, oxygen available and suction available   Procedure details (see MAR for exact dosages):    Preoxygenation:  Nasal cannula   Sedation:  Propofol and ketamine   Intended level of sedation: deep   Intra-procedure monitoring:  Blood pressure monitoring, cardiac monitor, continuous pulse oximetry, frequent LOC assessments, frequent vital sign checks and continuous capnometry   Intra-procedure events: none     Total Provider sedation time (minutes):  21 Post-procedure details:    Post-sedation assessment completed:  04/12/2019 12:58 AM   Attendance: Constant attendance by certified staff until patient recovered     Recovery: Patient returned to pre-procedure baseline     Post-sedation assessments completed and reviewed: airway patency, cardiovascular function, hydration status, mental status, nausea/vomiting, pain level, respiratory function and temperature     Patient is stable for discharge or admission: yes     Patient tolerance:  Tolerated well, no immediate complications Reduction of dislocation  Date/Time: 04/12/2019 12:57 AM Performed by: Varney Biles, MD Authorized by: Varney Biles, MD  Consent: Written consent obtained. Risks and benefits: risks, benefits and alternatives were discussed Consent given by: patient Patient understanding: patient states understanding of the procedure being performed Patient consent: the patient's understanding of the procedure matches consent given Procedure consent:  procedure consent matches procedure scheduled Relevant documents: relevant documents present and verified Test results: test results available and properly labeled Site marked: the operative site was  marked Imaging studies: imaging studies available Patient identity confirmed: arm band Time out: Immediately prior to procedure a "time out" was called to verify the correct patient, procedure, equipment, support staff and site/side marked as required. Local anesthesia used: no  Anesthesia: Local anesthesia used: no     MDM   Assuming care from Dr. Lacinda Axon. Patient had come to the ER with shoulder dislocation.  It appears that the shoulder has been dislocated for few days now.  Dr. Lacinda Axon had attempted procedural sedation and shoulder reduction earlier, it was unsuccessful. Orthopedic surgeon requesting that the patient be transferred to [facility], and currently that transfer is pending.  I reassessed the patient.  He is comfortable at this time.  He is willing for [place] to attempt reduction 1 more time, if there is a possibility to prevent transfer to [facility].  Patient reconsented for the procedure, and we were able to successfully reduce the  L shoulder.   Patient does indicate to me that he is having slight numbness over the left deltoid.  2+ radial pulse.  Questionable neuropraxia. Will reassess prior to d/c.    Varney Biles, MD 04/12/19 [phone]

## 2019-04-12 NOTE — Discharge Instructions (Addendum)
We have successfully reduced your shoulder in the ED today Please call Dr. Aline Brochure to schedule an appointment for further evaluation outpatient Continue wearing your sling for comfort until you are evaluated by ortho. While at home you may apply ice to the area to help reduce pain and swelling I have prescribed a short course of pain medication for you to take as needed for severe pain. It is recommended to take 600-800 mg Ibuprofen first and then to take the narcotic medication for break through pain

## 2019-04-14 ENCOUNTER — Other Ambulatory Visit (INDEPENDENT_AMBULATORY_CARE_PROVIDER_SITE_OTHER): Payer: Self-pay | Admitting: Internal Medicine

## 2019-04-14 DIAGNOSIS — M542 Cervicalgia: Secondary | ICD-10-CM

## 2019-04-18 ENCOUNTER — Encounter: Payer: Self-pay | Admitting: Orthopedic Surgery

## 2019-04-18 ENCOUNTER — Other Ambulatory Visit: Payer: Self-pay

## 2019-04-18 ENCOUNTER — Ambulatory Visit (INDEPENDENT_AMBULATORY_CARE_PROVIDER_SITE_OTHER): Payer: Medicare Other | Admitting: Orthopedic Surgery

## 2019-04-18 VITALS — BP 187/102 | HR 79 | Temp 97.1°F | Ht 78.0 in | Wt 270.2 lb

## 2019-04-18 DIAGNOSIS — S43015A Anterior dislocation of left humerus, initial encounter: Secondary | ICD-10-CM

## 2019-04-18 DIAGNOSIS — S5402XA Injury of ulnar nerve at forearm level, left arm, initial encounter: Secondary | ICD-10-CM | POA: Diagnosis not present

## 2019-04-18 DIAGNOSIS — M542 Cervicalgia: Secondary | ICD-10-CM | POA: Diagnosis not present

## 2019-04-18 MED ORDER — IBUPROFEN 800 MG PO TABS: 800.0000 mg | ORAL_TABLET | Freq: Three times a day (TID) | ORAL | 1 refills | Status: DC | PRN

## 2019-04-18 MED ORDER — TIZANIDINE HCL 2 MG PO CAPS: 2.0000 mg | ORAL_CAPSULE | Freq: Three times a day (TID) | ORAL | 2 refills | Status: DC

## 2019-04-18 NOTE — Progress Notes (Signed)
Nathaniel Kline  04/18/2019  Body mass index is 31.22 kg/m.   HISTORY SECTION :  Chief Complaint  Patient presents with  . Follow-up    Left shoulder is swelling and has numbness from elbow to hand  . Shoulder Problem    has pain from shoulder to neck    HPI 69 year old male with dislocated his left shoulder 19 February had closed reduction comes in complaining of neck and right sided upper shoulder pain and numbness in his left forearm starting at his elbow radiates to his left small finger  His left arm is swollen and he is not wearing his sling Review of Systems  Constitutional: Negative for chills and fever.  Skin: Negative for rash.  Neurological: Positive for tingling and sensory change. Negative for weakness.     has a past medical history of Alcohol abuse, HLD (hyperlipidemia), and HTN (hypertension).   Past Surgical History:  Procedure Laterality Date  . 2 caths  10/21/05   caridac cath    Body mass index is 31.22 kg/m.   Allergies  Allergen Reactions  . Other     Peanut butter, carrots, celery      Current Outpatient Medications:  .  amLODipine (NORVASC) 10 MG tablet, TAKE 1 TABLET BY MOUTH EVERY DAY, Disp: 90 tablet, Rfl: 3 .  aspirin 81 MG EC tablet, Take 81 mg by mouth daily.  , Disp: , Rfl:  .  diphenhydrAMINE (BENADRYL) 25 mg capsule, Take 25 mg by mouth every 6 (six) hours as needed for itching or allergies., Disp: , Rfl:  .  Multiple Vitamin (MULTIVITAMIN) tablet, Take 1 tablet by mouth daily.  , Disp: , Rfl:  .  vitamin C (ASCORBIC ACID) 500 MG tablet, Take 500 mg by mouth daily., Disp: , Rfl:  .  HYDROcodone-acetaminophen (NORCO/VICODIN) 5-325 MG tablet, Take 1 tablet by mouth every 6 (six) hours as needed for severe pain. (Patient not taking: Reported on 04/18/2019), Disp: 15 tablet, Rfl: 0 .  ibuprofen (ADVIL) 800 MG tablet, Take 1 tablet (800 mg total) by mouth every 8 (eight) hours as needed., Disp: 90 tablet, Rfl: 1 .  tizanidine (ZANAFLEX) 2 MG  capsule, Take 1 capsule (2 mg total) by mouth 3 (three) times daily., Disp: 30 capsule, Rfl: 2   PHYSICAL EXAM SECTION: 1) BP (!) 187/102 (BP Location: Right Arm, Patient Position: Sitting)   Pulse 79   Temp (!) 97.1 F (36.2 C)   Ht 6\' 6"  (1.981 m)   Wt 270 lb 3.2 oz (122.6 kg)   BMI 31.22 kg/m   Body mass index is 31.22 kg/m. General appearance: Well-developed well-nourished no gross deformities  2) Cardiovascular normal pulse and perfusion , normal color    4) Psychological: Awake alert and oriented x3 mood and affect normal  5) Skin no lacerations or ulcerations no nodularity no palpable masses, no erythema or nodularity  6) Musculoskeletal:   He does have sensory loss to the ulnar nerve but sharp touch is intact soft touch is intact he does have some tingling there  Neck range of motion is restricted but that is chronic he does have some tenderness on the right upper cervical spine musculature  He is otherwise neurovascularly intact he has some mild shoulder tenderness in his left arm is swollen    MEDICAL DECISION MAKING  A.  Encounter Diagnoses  Name Primary?  Marland Kitchen Anterior shoulder dislocation, left, initial encounter Yes  . Neurapraxia of left ulnar nerve, initial encounter   .  Acute neck pain     B. DATA ANALYSED:  IMAGING: Independent interpretation of images: Outside images show pre-reduction dislocation and then post reduction relocation  Orders: NO   Outside records reviewed: ER RECORDS   C. MANAGEMENT   Sling as needed  Medication for muscle spasms and neck pain  Nerve injury should resolve on its own  Recheck in 4 weeks  Meds ordered this encounter  Medications  . ibuprofen (ADVIL) 800 MG tablet    Sig: Take 1 tablet (800 mg total) by mouth every 8 (eight) hours as needed.    Dispense:  90 tablet    Refill:  1  . tizanidine (ZANAFLEX) 2 MG capsule    Sig: Take 1 capsule (2 mg total) by mouth 3 (three) times daily.    Dispense:  30  capsule    Refill:  2      Arther Abbott, MD  04/18/2019 12:32 PM

## 2019-04-18 NOTE — Patient Instructions (Signed)
Use heating pad on your neck   Wear sling as needed   The nerve injury will take time   Take ibuprofen and tizanidine

## 2019-04-24 ENCOUNTER — Other Ambulatory Visit: Payer: Self-pay

## 2019-04-24 ENCOUNTER — Encounter (INDEPENDENT_AMBULATORY_CARE_PROVIDER_SITE_OTHER): Payer: Self-pay | Admitting: Nurse Practitioner

## 2019-04-24 ENCOUNTER — Ambulatory Visit (INDEPENDENT_AMBULATORY_CARE_PROVIDER_SITE_OTHER): Payer: Medicare Other | Admitting: Nurse Practitioner

## 2019-04-24 DIAGNOSIS — M25511 Pain in right shoulder: Secondary | ICD-10-CM

## 2019-04-24 DIAGNOSIS — Z7189 Other specified counseling: Secondary | ICD-10-CM

## 2019-04-24 DIAGNOSIS — Z7185 Encounter for immunization safety counseling: Secondary | ICD-10-CM

## 2019-04-24 NOTE — Progress Notes (Signed)
Subjective:  Patient ID: Nathaniel Kline, male    DOB: Nov 03, 1950  Age: 69 y.o. MRN: [id number]  CC:  Chief Complaint  Patient presents with  . Shoulder Pain  . Neck Pain  . Follow-up      HPI  This patient arrives today for the above.  He was last seen in this office by myself on 03/13/19.  At that time he complained of right shoulder and neck pain.  The neck pain appears to actually be pain that originates in the shoulder but refers to his neck.  He did undergo x-ray of his right shoulder which did not show any significant acute abnormalities.  Since that visit he fell and dislocated his left shoulder and has been evaluated by orthopedics.  He tells me that when he saw his orthopedist Dr. Both his right and left shoulder were evaluated.  The doctor recommended he take a muscle relaxer as well as prescription strength ibuprofen as needed for pain.  Patient tells me he has been taking these medications as needed and has been experiencing adequate pain relief.  He tells me he is scheduled to follow-up with his orthopedist later on this month.  He denies any abdominal pain or blood noted in his stool.      Past Medical History:  Diagnosis Date  . Alcohol abuse   . HLD (hyperlipidemia)   . HTN (hypertension)       Family History  Problem Relation Age of Onset  . Hypertension Other        family hx  . Diabetes Mother   . High blood pressure Mother   . High blood pressure Sister   . High blood pressure Brother   . Cancer Brother   . High blood pressure Sister     Social History   Social History Narrative   Disabled, does not get regular exercise.    Social History   Tobacco Use  . Smoking status: Former Smoker    Packs/day: 0.50    Years: 19.00    Pack years: 9.50    Types: Cigarettes    Start date: 08/21/1974    Quit date: 08/20/1993    Years since quitting: 25.6  . Smokeless tobacco: Never Used  . Tobacco comment: tobacco use - no  Substance Use Topics  .  Alcohol use: Yes    Alcohol/week: 4.0 standard drinks    Types: 4 Shots of liquor per week    Comment: 2-3 fifths/day     Current Meds  Medication Sig  . amLODipine (NORVASC) 10 MG tablet TAKE 1 TABLET BY MOUTH EVERY DAY  . aspirin 81 MG EC tablet Take 81 mg by mouth daily.    . diphenhydrAMINE (BENADRYL) 25 mg capsule Take 25 mg by mouth every 6 (six) hours as needed for itching or allergies.  Marland Kitchen HYDROcodone-acetaminophen (NORCO/VICODIN) 5-325 MG tablet Take 1 tablet by mouth every 6 (six) hours as needed for severe pain.  Marland Kitchen ibuprofen (ADVIL) 800 MG tablet Take 1 tablet (800 mg total) by mouth every 8 (eight) hours as needed.  . Multiple Vitamin (MULTIVITAMIN) tablet Take 1 tablet by mouth daily.    . tizanidine (ZANAFLEX) 2 MG capsule Take 1 capsule (2 mg total) by mouth 3 (three) times daily.  . vitamin C (ASCORBIC ACID) 500 MG tablet Take 500 mg by mouth daily.    ROS:  Review of Systems  Constitutional: Negative for fever, malaise/fatigue and weight loss.  Eyes: Negative for blurred  vision and double vision.  Respiratory: Negative for cough, shortness of breath and wheezing.   Cardiovascular: Negative for chest pain and palpitations.  Genitourinary: Negative for dysuria and hematuria.  Musculoskeletal: Positive for joint pain (right and left shoulder pain).  Neurological: Negative for dizziness and headaches.     Objective:   Today's Vitals: BP 140/90 (BP Location: Right Arm, Patient Position: Sitting, Cuff Size: Normal)   Pulse 85   Temp 98.2 F (36.8 C) (Temporal)   Ht 6\' 6"  (1.981 m)   Wt 271 lb (122.9 kg)   SpO2 98%   BMI 31.32 kg/m  Vitals with BMI 04/24/2019 04/18/2019 04/12/2019  Height 6\' 6"  6\' 6"  -  Weight 271 lbs 270 lbs 3 oz -  BMI XX123456 123456 -  Systolic XX123456 [id number] XX123456  Diastolic 90 A999333 A999333  Pulse 85 79 79     Physical Exam Vitals reviewed.  Constitutional:      Appearance: Normal appearance.  HENT:     Head: Normocephalic and atraumatic.    Cardiovascular:     Rate and Rhythm: Normal rate and regular rhythm.  Pulmonary:     Effort: Pulmonary effort is normal.     Breath sounds: Normal breath sounds.  Musculoskeletal:     Right shoulder: Tenderness present. No swelling. Normal range of motion.     Left shoulder: No swelling or tenderness. Normal range of motion.     Cervical back: Neck supple.  Skin:    General: Skin is warm and dry.  Neurological:     Mental Status: He is alert and oriented to person, place, and time.  Psychiatric:        Mood and Affect: Mood normal.        Behavior: Behavior normal.        Thought Content: Thought content normal.        Judgment: Judgment normal.          Assessment   1. Acute pain of right shoulder   2. Vaccine counseling     Tests ordered No orders of the defined types were placed in this encounter.    Plan: 1.  I encouraged him to continue using his ibuprofen and as needed muscle relaxer for pain management.  He will also follow-up with his orthopedist as scheduled.  2.  He did request shingles vaccine to be administered today.  However I do see that he is scheduled to have the COVID-19 vaccine series began approximately 3 days.  I recommended that he wait to have the COVID-19 vaccine series completed and wait for an additional 2 weeks before he has any other vaccinations administered.  He tells me he understands.   No orders of the defined types were placed in this encounter.   Patient to follow-up in 3 months for annual physical exam with Dr. Marinda Elk.  Consider further imaging if he continues to have significant pain in shoulders despite conservative treatment.  Ailene Ards, NP

## 2019-04-27 ENCOUNTER — Ambulatory Visit: Payer: Medicare Other

## 2019-04-30 ENCOUNTER — Other Ambulatory Visit (INDEPENDENT_AMBULATORY_CARE_PROVIDER_SITE_OTHER): Payer: Self-pay | Admitting: Internal Medicine

## 2019-04-30 DIAGNOSIS — M542 Cervicalgia: Secondary | ICD-10-CM

## 2019-05-03 ENCOUNTER — Ambulatory Visit: Payer: Medicare Other | Attending: Internal Medicine

## 2019-05-03 DIAGNOSIS — Z23 Encounter for immunization: Secondary | ICD-10-CM

## 2019-05-03 NOTE — Progress Notes (Signed)
   Covid-19 Vaccination Clinic  Name:  Nathaniel Kline    MRN: [id number] DOB: 14-Mar-1950  05/03/2019  [name] was observed post Covid-19 immunization for 15 minutes without incident. He was provided with Vaccine Information Sheet and instruction to access the V-Safe system.   [name] was instructed to call 911 with any severe reactions post vaccine: Marland Kitchen Difficulty breathing  . Swelling of face and throat  . A fast heartbeat  . A bad rash all over body  . Dizziness and weakness   Immunizations Administered    Name Date Dose VIS Date Route   Moderna COVID-19 Vaccine 05/03/2019  9:40 AM 0.5 mL 01/21/2019 Intramuscular   Manufacturer: Moderna   Lot: [id number]   [id number]

## 2019-05-14 ENCOUNTER — Ambulatory Visit: Payer: Medicare Other

## 2019-05-14 ENCOUNTER — Other Ambulatory Visit: Payer: Self-pay

## 2019-05-14 ENCOUNTER — Ambulatory Visit (INDEPENDENT_AMBULATORY_CARE_PROVIDER_SITE_OTHER): Payer: Medicare Other | Admitting: Orthopedic Surgery

## 2019-05-14 ENCOUNTER — Encounter: Payer: Self-pay | Admitting: Orthopedic Surgery

## 2019-05-14 ENCOUNTER — Other Ambulatory Visit (INDEPENDENT_AMBULATORY_CARE_PROVIDER_SITE_OTHER): Payer: Self-pay | Admitting: Internal Medicine

## 2019-05-14 VITALS — BP 140/88 | HR 86 | Temp 97.4°F | Ht 78.0 in | Wt 271.0 lb

## 2019-05-14 DIAGNOSIS — S43005D Unspecified dislocation of left shoulder joint, subsequent encounter: Secondary | ICD-10-CM | POA: Diagnosis not present

## 2019-05-14 DIAGNOSIS — G8929 Other chronic pain: Secondary | ICD-10-CM

## 2019-05-14 DIAGNOSIS — M25511 Pain in right shoulder: Secondary | ICD-10-CM | POA: Diagnosis not present

## 2019-05-14 DIAGNOSIS — M542 Cervicalgia: Secondary | ICD-10-CM

## 2019-05-14 DIAGNOSIS — M25512 Pain in left shoulder: Secondary | ICD-10-CM

## 2019-05-14 NOTE — Progress Notes (Signed)
Chief Complaint  Patient presents with  . Shoulder Injury    left shoulder dislocation 04/11/19   HPI 68 year old male with dislocated his left shoulder 19 February had closed reduction comes in complaining of neck and right sided upper shoulder pain and numbness in his left forearm starting at his elbow radiates to his left small finger   His left arm is swollen and he is not wearing his sling Review of Systems  Constitutional: Negative for chills and fever.  Skin: Negative for rash.  Neurological: Positive for tingling and sensory change. Negative for weakness.   Nathaniel Kline is complaining of tightness upper right shoulder and neck and an x-ray on February 4 normal.  I have reviewed and agree that it was normal and included 3 views of the shoulder  He also continues to have lack of any movement in his left shoulder.  A rereview of his x-ray shows only 1 post reduction view was obtained.  So that will be repeated  He also has numbness and tingling ulnar nerve distribution left hand which is improving  On examination however he has almost 0 external rotation he has a step-off between his acromion and humeral head his passive range of motion is 20 degrees of extension 60 degrees of abduction and 80 degrees of passive flexion.  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  His x-ray shows chronic changes around his subacromial area and humeral head but the shoulder is reduced  Recommend MRI left shoulder to evaluate rotator cuff for tearing  Physical therapy right shoulder and neck for spasms  Continue ibuprofen tizanidine and he has weaned himself off of opioid Norco  Return after MRI

## 2019-05-14 NOTE — Patient Instructions (Addendum)
Mri left shoulder   Therapy right shoulder /neck   Sling wear for comfort

## 2019-05-14 NOTE — Addendum Note (Signed)
Addended byCandice Camp on: 05/14/2019 02:46 PM   Modules accepted: Orders

## 2019-05-21 ENCOUNTER — Ambulatory Visit (HOSPITAL_COMMUNITY): Payer: Medicare Other | Admitting: Physical Therapy

## 2019-05-21 ENCOUNTER — Encounter (HOSPITAL_COMMUNITY): Payer: Self-pay

## 2019-05-21 ENCOUNTER — Ambulatory Visit (HOSPITAL_COMMUNITY): Payer: Medicare Other | Attending: Orthopedic Surgery | Admitting: Physical Therapy

## 2019-05-21 ENCOUNTER — Ambulatory Visit (HOSPITAL_COMMUNITY): Payer: Medicare Other | Admitting: Occupational Therapy

## 2019-05-21 ENCOUNTER — Other Ambulatory Visit: Payer: Self-pay

## 2019-05-21 DIAGNOSIS — M25511 Pain in right shoulder: Secondary | ICD-10-CM | POA: Diagnosis not present

## 2019-05-21 DIAGNOSIS — M25512 Pain in left shoulder: Secondary | ICD-10-CM | POA: Diagnosis not present

## 2019-05-21 DIAGNOSIS — M542 Cervicalgia: Secondary | ICD-10-CM | POA: Insufficient documentation
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
## 2019-05-21 NOTE — Patient Instructions (Signed)
Access Code: [email or web address] URL: [email or web address] Date: 05/21/2019 Prepared by: Josue Hector  Exercises Circular Shoulder Pendulum with Table Support - 2 x daily - 7 x weekly - 2 sets - 10 reps Seated Shoulder Flexion Towel Slide at Table Top - 2 x daily - 7 x weekly - 2 sets - 10 reps Seated Scapular Retraction - 2 x daily - 7 x weekly - 2 sets - 10 reps - 5 hold

## 2019-05-23 ENCOUNTER — Telehealth (HOSPITAL_COMMUNITY): Payer: Self-pay | Admitting: Physical Therapy

## 2019-05-23 NOTE — Telephone Encounter (Signed)
Nathaniel Kline, Please update me on this patient. He was scheduled for OT and PT on 3/31 and you saw him for his evaluation. According to Magda Paganini you were to let front office know if this patitne needs OT or if we should close this referral after your evaluation.

## 2019-05-29 ENCOUNTER — Other Ambulatory Visit: Payer: Self-pay

## 2019-05-29 ENCOUNTER — Ambulatory Visit (HOSPITAL_COMMUNITY)
Admission: RE | Admit: 2019-05-29 | Discharge: 2019-05-29 | Disposition: A | Discharge status: Home / Self Care | Payer: Medicare Other | Source: Ambulatory Visit | Attending: Orthopedic Surgery | Admitting: Orthopedic Surgery

## 2019-05-29 ENCOUNTER — Telehealth: Payer: Self-pay | Admitting: Orthopedic Surgery

## 2019-05-29 DIAGNOSIS — M75122 Complete rotator cuff tear or rupture of left shoulder, not specified as traumatic: Secondary | ICD-10-CM | POA: Diagnosis not present

## 2019-05-29 DIAGNOSIS — S43005D Unspecified dislocation of left shoulder joint, subsequent encounter: Secondary | ICD-10-CM | POA: Diagnosis not present

## 2019-05-29 DIAGNOSIS — S43005A Unspecified dislocation of left shoulder joint, initial encounter: Secondary | ICD-10-CM | POA: Diagnosis not present

## 2019-05-29 DIAGNOSIS — M25512 Pain in left shoulder: Secondary | ICD-10-CM | POA: Diagnosis not present

## 2019-05-29 DIAGNOSIS — G8929 Other chronic pain: Secondary | ICD-10-CM | POA: Insufficient documentation

## 2019-05-29 NOTE — Telephone Encounter (Signed)
Results mri given: torn rotator tear after dislocation of the left shoulder  Patient coming to the office also needs nerve study to determine status of nerve prior to rotator cuff repair  Graft augment on standby

## 2019-06-03 ENCOUNTER — Ambulatory Visit: Payer: Medicare Other | Admitting: Orthopedic Surgery

## 2019-06-04 ENCOUNTER — Ambulatory Visit: Payer: Medicare Other

## 2019-06-05 ENCOUNTER — Other Ambulatory Visit (INDEPENDENT_AMBULATORY_CARE_PROVIDER_SITE_OTHER): Payer: Self-pay | Admitting: Internal Medicine

## 2019-06-05 DIAGNOSIS — M542 Cervicalgia: Secondary | ICD-10-CM

## 2019-06-10 ENCOUNTER — Telehealth: Payer: Self-pay | Admitting: Orthopedic Surgery

## 2019-06-10 ENCOUNTER — Ambulatory Visit: Payer: Medicare Other | Admitting: Orthopedic Surgery

## 2019-06-10 NOTE — Telephone Encounter (Signed)
No appt needed I ll just call him

## 2019-06-10 NOTE — Telephone Encounter (Signed)
Notified patient that Dr Aline Brochure will call patient with results; appointment for office visit cancelled per Dr Aline Brochure.

## 2019-06-10 NOTE — Telephone Encounter (Signed)
-----   Message from Carole Civil, MD sent at 06/10/2019 10:38 AM EDT ----- Call mri Lyda Kalata

## 2019-06-10 NOTE — Telephone Encounter (Signed)
May patient receive MRI Results by virtual appt - needs to reschedule MRI review appointment for today, 06/10/19 for the 2nd time - still out of town =  Having car problem. Please advise.

## 2019-06-11 ENCOUNTER — Ambulatory Visit: Payer: Medicare Other

## 2019-06-12 ENCOUNTER — Ambulatory Visit: Payer: Medicare Other | Attending: Internal Medicine

## 2019-06-12 DIAGNOSIS — Z23 Encounter for immunization: Secondary | ICD-10-CM

## 2019-06-12 NOTE — Progress Notes (Signed)
   Covid-19 Vaccination Clinic  Name:  Nathaniel Kline    MRN: [id number] DOB: 07/20/1950  06/12/2019  Mr. Belt was observed post Covid-19 immunization for 15 minutes without incident. He was provided with Vaccine Information Sheet and instruction to access the V-Safe system.   [name] was instructed to call 911 with any severe reactions post vaccine: Marland Kitchen Difficulty breathing  . Swelling of face and throat  . A fast heartbeat  . A bad rash all over body  . Dizziness and weakness   Immunizations Administered    Name Date Dose VIS Date Route   Moderna COVID-19 Vaccine 06/12/2019 10:58 AM 0.5 mL 01/2019 Intramuscular   Manufacturer: Moderna   Lot: [id number]   [id number]

## 2019-06-24 ENCOUNTER — Encounter (HOSPITAL_COMMUNITY): Payer: Self-pay | Admitting: Physical Therapy

## 2019-06-24 NOTE — Therapy (Unsigned)
[facility] [address] [place], [place], [address] Phone: [phone]   Fax:  [phone]  Patient Details  Name: Nathaniel Kline MRN: [id number] Date of Birth: Jul 06, 1950 Referring Provider:  No ref. provider found  Encounter Date: 06/24/2019   [name] 06/24/2019, 12:05 PM  [facility] [address] [place], [place], [address] Phone: [phone]   Fax:  [phone]

## 2019-06-24 NOTE — Telephone Encounter (Signed)
Patient called (voice message received/call returned)  - states did not receive the call from Dr Aline Brochure as noted 06/10/19 10:38am with his MRI results. Please call [phone]

## 2019-06-25 ENCOUNTER — Telehealth: Payer: Self-pay | Admitting: Orthopedic Surgery

## 2019-06-25 NOTE — Telephone Encounter (Signed)
IMPRESSION: 1. Motion degraded exam. 2. Full-thickness complete or near-complete tears of the supraspinatus and infraspinatus tendons with retraction to the level of the humeral head apex. 3. Tendinosis with probable partial tearing of the subscapularis tendon. 4. Intra-articular biceps tendinosis. 5. Subtle contour deformity of the posterolateral aspect of the superior humeral head without residual marrow edema suggesting prior Hill-Sachs impaction deformity. 6. No soft tissue or bony Bankart lesion is clearly identified within the limitations of this exam.   Electronically Signed   By: Davina Poke D.O.   On: 05/29/2019 14:20   Results given  Needs appt

## 2019-06-27 ENCOUNTER — Ambulatory Visit: Payer: Medicare Other | Admitting: Orthopedic Surgery

## 2019-06-30 ENCOUNTER — Telehealth: Payer: Self-pay | Admitting: Orthopedic Surgery

## 2019-06-30 ENCOUNTER — Ambulatory Visit: Payer: Medicare Other | Admitting: Orthopedic Surgery

## 2019-06-30 NOTE — Telephone Encounter (Signed)
-----   Message from Carole Civil, MD sent at 06/25/2019 11:52 AM EDT ----- Friday appt for 11

## 2019-06-30 NOTE — Telephone Encounter (Signed)
Patient follow up after receiving MRI results had been scheduled as per Dr Ruthe Mannan note of 06/25/19, for appointment in office 06/27/19. Patient did not attend appointment, then rescheduled for Monday, 06/30/19. Voice message received on 06/30/19 from patient requesting to cancel. Done as per patient request.

## 2019-07-10 ENCOUNTER — Telehealth: Payer: Self-pay | Admitting: Orthopedic Surgery

## 2019-07-10 NOTE — Telephone Encounter (Signed)
Can patient receive MRI results via phone?  He had been unable to keep previously scheduled appointments due to having to be in [place] with daughter.  Michela Pitcher he is home now.  Please advise. PH# [phone]

## 2019-07-17 NOTE — Telephone Encounter (Signed)
yes

## 2019-07-31 ENCOUNTER — Other Ambulatory Visit: Payer: Self-pay

## 2019-07-31 ENCOUNTER — Encounter (INDEPENDENT_AMBULATORY_CARE_PROVIDER_SITE_OTHER): Payer: Self-pay | Admitting: Internal Medicine

## 2019-07-31 ENCOUNTER — Ambulatory Visit (INDEPENDENT_AMBULATORY_CARE_PROVIDER_SITE_OTHER): Payer: Medicare Other | Admitting: Internal Medicine

## 2019-07-31 VITALS — BP 140/90 | HR 97 | Temp 97.1°F | Ht 78.0 in | Wt 259.8 lb

## 2019-07-31 DIAGNOSIS — I10 Essential (primary) hypertension: Secondary | ICD-10-CM

## 2019-07-31 DIAGNOSIS — E669 Obesity, unspecified: Secondary | ICD-10-CM

## 2019-07-31 NOTE — Progress Notes (Signed)
Metrics: Intervention Frequency ACO  Documented Smoking Status Yearly  Screened one or more times in 24 months  Cessation Counseling or  Active cessation medication Past 24 months  Past 24 months   Guideline developer: UpToDate (See UpToDate for funding source) Date Released: 2014       Wellness Office Visit  Subjective:  Patient ID: Nathaniel Kline, male    DOB: [birth year]  Age: 69 y.o. MRN: [id number]  CC: This man was scheduled for an annual physical but he was not expecting this so we converted this visit into an office visit for follow-up of his hypertension and obesity. HPI  In fact, he has managed to lose about 12 pounds since the last time he was seen as he has reduced his portion sizes of his food and reduced alcohol consumption also. He continues to take amlodipine for his hypertension. Past Medical History:  Diagnosis Date  . Alcohol abuse   . HLD (hyperlipidemia)   . HTN (hypertension)    Past Surgical History:  Procedure Laterality Date  . 2 caths  10/21/05   caridac cath     Family History  Problem Relation Age of Onset  . Hypertension Other        family hx  . Diabetes Mother   . High blood pressure Mother   . High blood pressure Sister   . High blood pressure Brother   . Cancer Brother   . High blood pressure Sister     Social History   Social History Narrative   Single.Disabled, does not get regular exercise.    Social History   Tobacco Use  . Smoking status: Former Smoker    Packs/day: 0.50    Years: 19.00    Pack years: 9.50    Types: Cigarettes    Start date: 08/21/1974    Quit date: 08/20/1993    Years since quitting: 25.9  . Smokeless tobacco: Never Used  . Tobacco comment: tobacco use - no  Substance Use Topics  . Alcohol use: Yes    Comment: 1/2 pint at weekends    Current Meds  Medication Sig  . amLODipine (NORVASC) 10 MG tablet TAKE 1 TABLET BY MOUTH EVERY DAY  . aspirin 81 MG EC tablet Take 81 mg by mouth daily.    . Multiple  Vitamin (MULTIVITAMIN) tablet Take 1 tablet by mouth daily.    Marland Kitchen tiZANidine (ZANAFLEX) 2 MG tablet TAKE 1 TABLET (2 MG TOTAL) BY MOUTH 2 (TWO) TIMES DAILY AS NEEDED FOR MUSCLE SPASMS.  . vitamin C (ASCORBIC ACID) 500 MG tablet Take 500 mg by mouth daily.  . [DISCONTINUED] ibuprofen (ADVIL) 800 MG tablet Take 1 tablet (800 mg total) by mouth every 8 (eight) hours as needed.     Depression screen PHQ 2/9 07/31/2019  Decreased Interest 0  Down, Depressed, Hopeless 0  PHQ - 2 Score 0     Objective:   Today's Vitals: BP 140/90 (BP Location: Left Arm, Patient Position: Sitting, Cuff Size: Normal)   Pulse 97   Temp (!) 97.1 F (36.2 C) (Temporal)   Ht 6\' 6"  (1.981 m)   Wt 259 lb 12.8 oz (117.8 kg)   SpO2 98%   BMI 30.02 kg/m  Vitals with BMI 07/31/2019 05/14/2019 04/24/2019  Height 6\' 6"  6\' 6"  6\' 6"   Weight 259 lbs 13 oz 271 lbs 271 lbs  BMI 30.03 47.42 59.56  Systolic 387 564 332  Diastolic 90 88 90  Pulse 97 86 85  Physical Exam  He looks systemically well.  He remains obese.  He has lost 12 pounds.  Blood pressure borderline elevated.  He is alert and orientated without any focal neurological signs.     Assessment   1. Essential hypertension, benign   2. Obesity (BMI 30-39.9)       Tests ordered No orders of the defined types were placed in this encounter.    Plan: 1. He will continue with the same dose of amlodipine for his hypertension for the time being. 2. We discussed his obesity and the importance of losing further weight.  I discussed briefly intermittent fasting and if he can do this 16 hours every day, I think you will progress to further weight loss and fat loss. 3. I will see him in about a month's time for close follow-up and we will then probably do some blood work also.   No orders of the defined types were placed in this encounter.   Doree Albee, MD

## 2019-07-31 NOTE — Patient Instructions (Signed)
[facility] Dietary Recommendations for Weight Loss What to Avoid . Avoid added sugars o Often added sugar can be found in processed foods such as many condiments, dry cereals, cakes, cookies, chips, crisps, crackers, candies, sweetened drinks, etc.  o Read labels and AVOID/DECREASE use of foods with the following in their ingredient list: Sugar, fructose, high fructose corn syrup, sucrose, glucose, maltose, dextrose, molasses, cane sugar, brown sugar, any type of syrup, agave nectar, etc.   . Avoid snacking in between meals . Avoid foods made with flour o If you are going to eat food made with flour, choose those made with whole-grains; and, minimize your consumption as much as is tolerable . Avoid processed foods o These foods are generally stocked in the middle of the grocery store. Focus on shopping on the perimeter of the grocery.  . Avoid Meat  o We recommend following a plant-based diet at [facility]. Thus, we recommend avoiding meat as a general rule. Consider eating beans, legumes, eggs, and/or dairy products for regular protein sources o If you plan on eating meat limit to 4 ounces of meat at a time and choose lean options such as Fish, chicken, turkey. Avoid red meat intake such as pork and/or steak What to Include . Vegetables o GREEN LEAFY VEGETABLES: Kale, spinach, mustard greens, collard greens, cabbage, broccoli, etc. o OTHER: Asparagus, cauliflower, eggplant, carrots, peas, Brussel sprouts, tomatoes, Bosso peppers, zucchini, beets, cucumbers, etc. . Grains, seeds, and legumes o Beans: kidney beans, black eyed peas, garbanzo beans, black beans, pinto beans, etc. o Whole, unrefined grains: brown rice, barley, bulgur, oatmeal, etc. . Healthy fats  o Avoid highly processed fats such as vegetable oil o Examples of healthy fats: avocado, olives, virgin olive oil, dark chocolate (?72% Cocoa), nuts (peanuts, almonds, walnuts, cashews, pecans, etc.) . None to Low  Intake of Animal Sources of Protein o Meat sources: chicken, turkey, salmon, tuna. Limit to 4 ounces of meat at one time. o Consider limiting dairy sources, but when choosing dairy focus on: PLAIN Greek yogurt, cottage cheese, high-protein milk . Fruit o Choose berries  When to Eat . Intermittent Fasting: o Choosing not to eat for a specific time period, but DO FOCUS ON HYDRATION when fasting o Multiple Techniques: - Time Restricted Eating: eat 3 meals in a day, each meal lasting no more than 60 minutes, no snacks between meals - 16-18 hour fast: fast for 16 to 18 hours up to 7 days a week. Often suggested to start with 2-3 nonconsecutive days per week.  . Remember the time you sleep is counted as fasting.  . Examples of eating schedule: Fast from 7:00pm-11:00am. Eat between 11:00am-7:00pm.  - 24-hour fast: fast for 24 hours up to every other day. Often suggested to start with 1 day per week . Remember the time you sleep is counted as fasting . Examples of eating schedule:  o Eating day: eat 2-3 meals on your eating day. If doing 2 meals, each meal should last no more than 90 minutes. If doing 3 meals, each meal should last no more than 60 minutes. Finish last meal by 7:00pm. o Fasting day: Fast until 7:00pm.  o IF YOU FEEL UNWELL FOR ANY REASON/IN ANY WAY WHEN FASTING, STOP FASTING BY EATING A NUTRITIOUS SNACK OR LIGHT MEAL o ALWAYS FOCUS ON HYDRATION DURING FASTS - Acceptable Hydration sources: water, broths, tea/coffee (black tea/coffee is best but using a small amount of whole-fat dairy products in coffee/tea is acceptable).  -   Poor Hydration Sources: anything with sugar or artificial sweeteners added to it  These recommendations have been developed for patients that are actively receiving medical care from either Dr. Shaka Cardin or Sarah Gray, DNP, NP-C at [facility]. These recommendations are developed for patients with specific medical conditions and are not meant to be  distributed or used by others that are not actively receiving care from either provider listed above at [facility]. It is not appropriate to participate in the above eating plans without proper medical supervision.   Reference: Fung, J. The obesity code. [place]: [place]; 2016.   

## 2019-08-28 ENCOUNTER — Ambulatory Visit (INDEPENDENT_AMBULATORY_CARE_PROVIDER_SITE_OTHER): Payer: Medicare Other | Admitting: Internal Medicine

## 2019-09-10 ENCOUNTER — Ambulatory Visit: Payer: Medicare Other | Admitting: Urology

## 2019-10-02 IMAGING — US IR BIOPSY CORE MUSCLE/SOFT TISSUE
1 series · 10 of 10 positions shown · non-contrast
Comparison: Ultrasound RIGHT axilla 04/24/2018

CLINICAL DATA: RIGHT axillary mass versus enlarged abnormal lymph
node

EXAM:
ULTRASOUND GUIDED CORE NEEDLE BIOPSY OF A RIGHT AXILLARY NODE

[Series 1: ir biopsy core muscle/soft tissue · 10 of 10 slices shown]
[im 1/10]
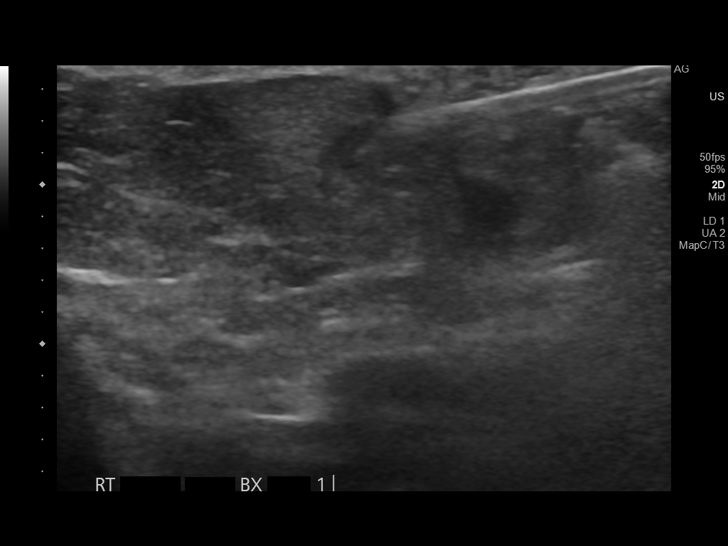
[im 2/10]
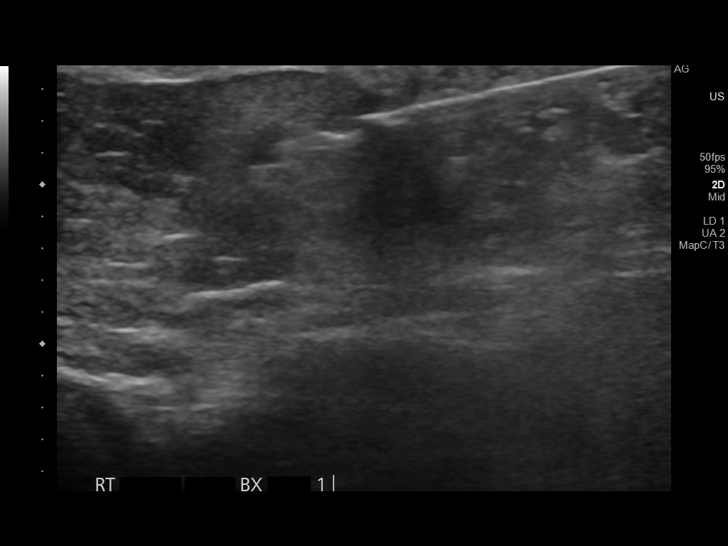
[im 3/10]
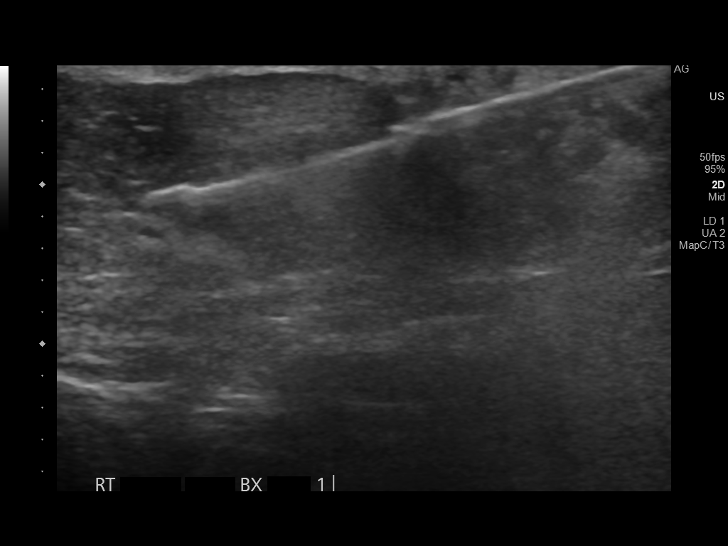
[im 4/10]
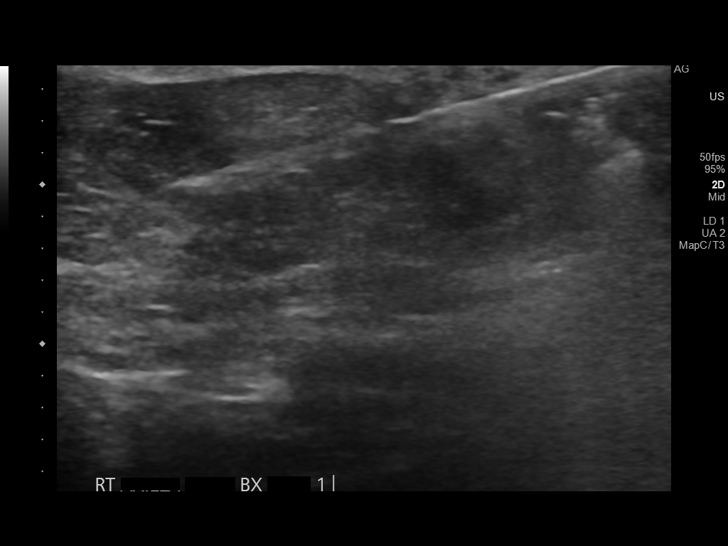
[im 5/10]
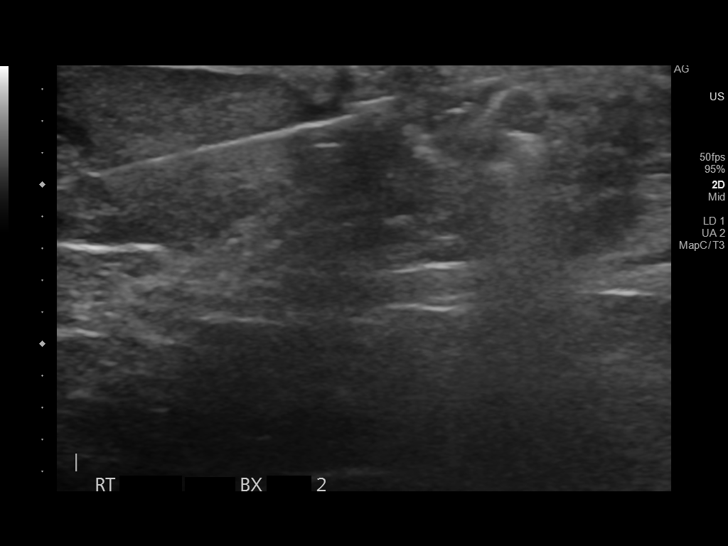
[im 6/10]
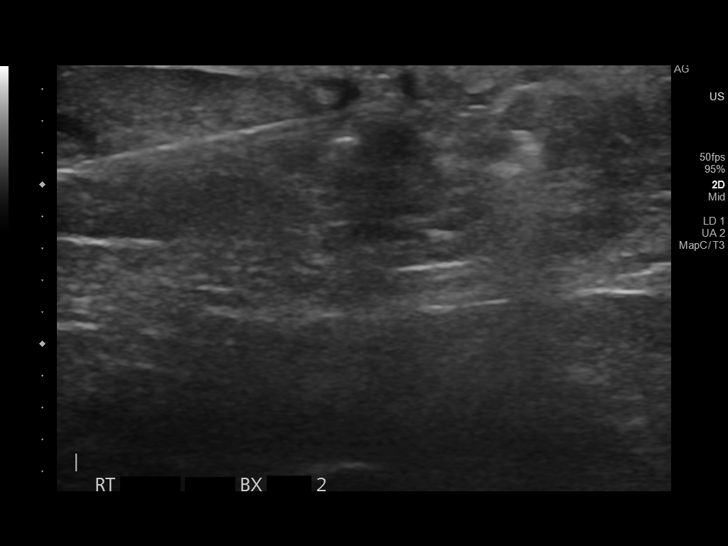
[im 7/10]
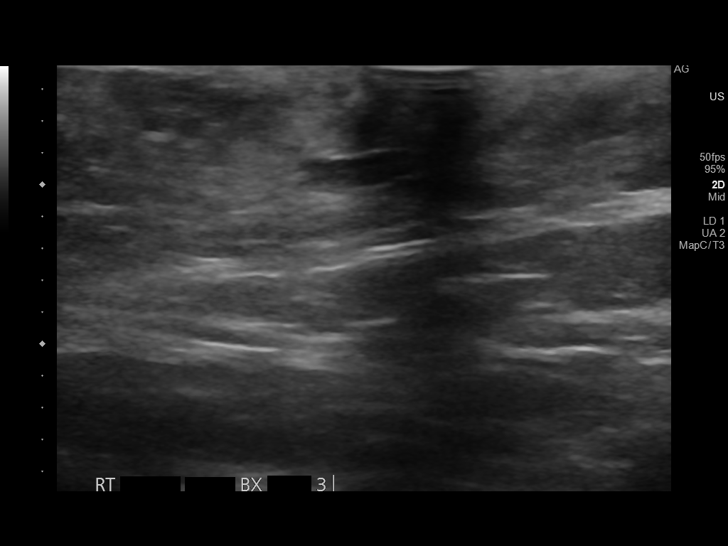
[im 8/10]
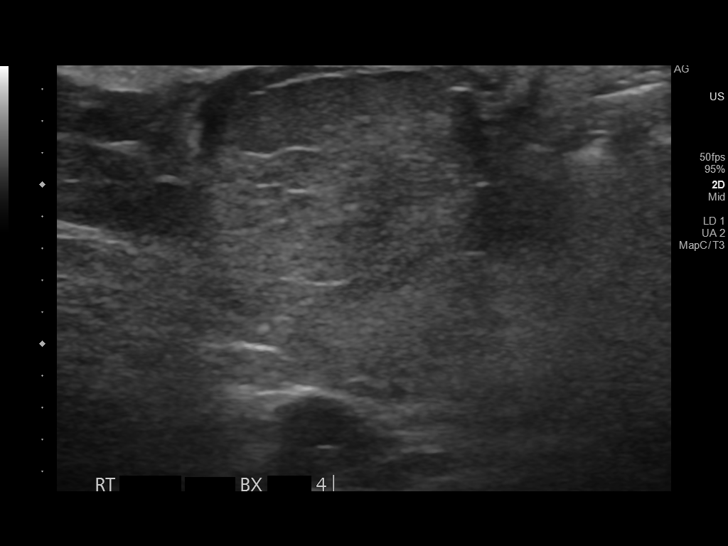
[im 9/10]
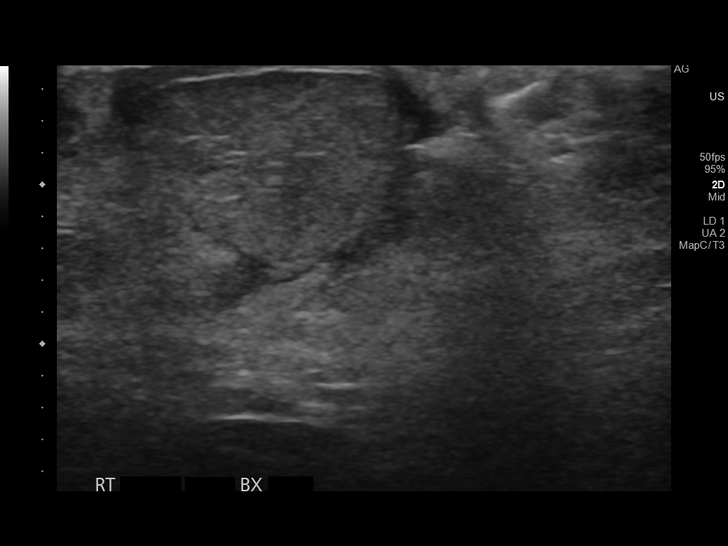
[im 10/10]
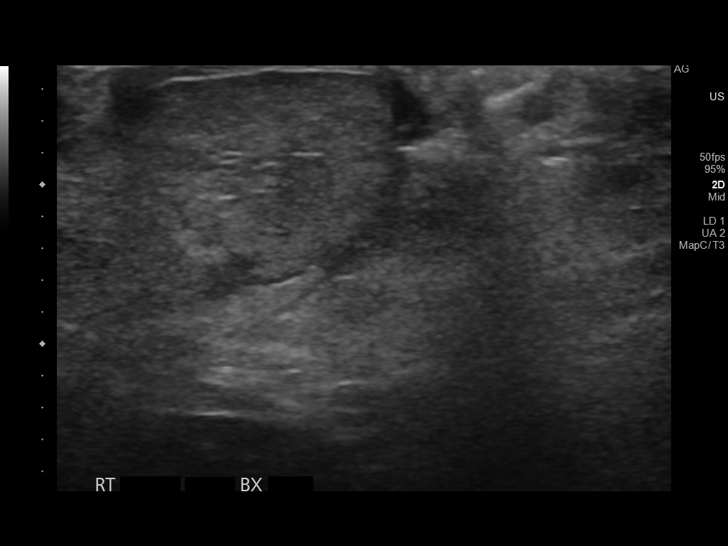

[10 of 10 positions shown; findings below may reference images not displayed]

FINDINGS: Procedure was discussed with the patient, including risks, benefits
and alternatives.

Written informed consent was obtained.

Time-out protocol followed.

Hair at RIGHT axilla with shaved.

With arm/hand elevated above head, RIGHT axilla prepped and draped
in usual sterile fashion.

Skin and soft tissues anesthetized with 5 mL of 2% lidocaine.

Under direct sonographic visualization, 4 core biopsies were
performed of the RIGHT axillary mass utilizing Pozisa Tiger 18 gauge
disposable core biopsy needle.

Samples were placed within saline and formalin.

Procedure tolerated very well by patient without immediate
complication.

No post procedure hematoma identified by sonographic imaging.

Routine postprocedural instructions provided to patient.
IMPRESSION: Ultrasound guided biopsy of RIGHT axillary mass/enlarged abnormal
lymph node.

No apparent complications.

## 2019-10-08 ENCOUNTER — Encounter: Payer: Self-pay | Admitting: Orthopedic Surgery

## 2019-10-08 ENCOUNTER — Ambulatory Visit: Payer: Medicare Other | Admitting: Orthopedic Surgery

## 2019-10-22 DIAGNOSIS — H16223 Keratoconjunctivitis sicca, not specified as Sjogren's, bilateral: Secondary | ICD-10-CM | POA: Diagnosis not present

## 2019-11-07 ENCOUNTER — Ambulatory Visit: Payer: Medicare Other | Admitting: Urology

## 2019-11-12 ENCOUNTER — Ambulatory Visit: Payer: Medicare Other | Admitting: Orthopedic Surgery

## 2019-11-12 ENCOUNTER — Encounter: Payer: Self-pay | Admitting: Orthopedic Surgery

## 2019-11-24 ENCOUNTER — Ambulatory Visit (INDEPENDENT_AMBULATORY_CARE_PROVIDER_SITE_OTHER): Payer: Medicare Other | Admitting: Internal Medicine

## 2019-12-01 ENCOUNTER — Ambulatory Visit (INDEPENDENT_AMBULATORY_CARE_PROVIDER_SITE_OTHER): Payer: Medicare Other | Admitting: Nurse Practitioner

## 2019-12-04 ENCOUNTER — Ambulatory Visit (INDEPENDENT_AMBULATORY_CARE_PROVIDER_SITE_OTHER): Payer: Medicare Other | Admitting: Nurse Practitioner

## 2019-12-19 ENCOUNTER — Ambulatory Visit (INDEPENDENT_AMBULATORY_CARE_PROVIDER_SITE_OTHER): Payer: Medicare Other | Admitting: Urology

## 2019-12-19 ENCOUNTER — Other Ambulatory Visit: Payer: Self-pay

## 2019-12-19 ENCOUNTER — Encounter: Payer: Self-pay | Admitting: Urology

## 2019-12-19 VITALS — BP 132/75 | HR 89 | Temp 99.1°F | Ht 78.0 in | Wt 259.8 lb

## 2019-12-19 DIAGNOSIS — N401 Enlarged prostate with lower urinary tract symptoms: Secondary | ICD-10-CM | POA: Diagnosis not present

## 2019-12-19 DIAGNOSIS — N529 Male erectile dysfunction, unspecified: Secondary | ICD-10-CM

## 2019-12-19 DIAGNOSIS — N138 Other obstructive and reflux uropathy: Secondary | ICD-10-CM

## 2019-12-19 DIAGNOSIS — R972 Elevated prostate specific antigen [PSA]: Secondary | ICD-10-CM | POA: Diagnosis not present

## 2019-12-19 NOTE — Progress Notes (Signed)
PVR=0  Urological Symptom Review  Patient is experiencing the following symptoms: Frequent urination Hard to postpone urination Get up at night to urinate Leakage of urine Stream starts and stops Weak stream Erection problems (male only)   Review of Systems  Gastrointestinal (upper)  : Negative for upper GI symptoms  Gastrointestinal (lower) : Negative for lower GI symptoms  Constitutional : Negative for symptoms  Skin: Negative for skin symptoms  Eyes: Negative for eye symptoms  Ear/Nose/Throat : Sinus problems  Hematologic/Lymphatic: Negative for Hematologic/Lymphatic symptoms  Cardiovascular : Negative for cardiovascular symptoms  Respiratory : Shortness of breath  Endocrine: Negative for endocrine symptoms  Musculoskeletal: Back pain  Neurological: Negative for neurological symptoms  Psychologic: Negative for psychiatric symptoms

## 2019-12-19 NOTE — Progress Notes (Signed)
12/19/2019 2:36 PM   Nathaniel Kline 04/23/1950 [id number]  Referring provider: Doree Albee, MD [address] [place],  [place] [address]  BPH and hx of elvated PSA  HPI: Nathaniel Kline is a 69yo here for evaluation of BPH and hx of elevated PSA. He has moderate LUTS on no BPH therapy. He has nocturia 2x, dribbling, hesitancy and occasional urgency. Stream is strong. He has a hx of elevated PSA and was seen at [facility]. PSA at that time was 4.6. Prostate biopsy in 2019 showed normal prostate tissue. PSA then increased to 9.8 in 2019 but patient has not followed up with [facility]   PMH: Past Medical History:  Diagnosis Date  . Alcohol abuse   . HLD (hyperlipidemia)   . HTN (hypertension)     Surgical History: Past Surgical History:  Procedure Laterality Date  . 2 caths  10/21/05   caridac cath    Home Medications:  Allergies as of 12/19/2019      Reactions   Other    Peanut butter, carrots, celery      Medication List       Accurate as of December 19, 2019  2:36 PM. If you have any questions, ask your nurse or doctor.        amLODipine 10 MG tablet Commonly known as: NORVASC TAKE 1 TABLET BY MOUTH EVERY DAY   aspirin 81 MG EC tablet Take 81 mg by mouth daily.   multivitamin tablet Take 1 tablet by mouth daily.   tiZANidine 2 MG tablet Commonly known as: ZANAFLEX TAKE 1 TABLET (2 MG TOTAL) BY MOUTH 2 (TWO) TIMES DAILY AS NEEDED FOR MUSCLE SPASMS.   vitamin C 500 MG tablet Commonly known as: ASCORBIC ACID Take 500 mg by mouth daily.       Allergies:  Allergies  Allergen Reactions  . Other     Peanut butter, carrots, celery     Family History: Family History  Problem Relation Age of Onset  . Hypertension Other        family hx  . Diabetes Mother   . High blood pressure Mother   . High blood pressure Sister   . High blood pressure Brother   . Cancer Brother   . High blood pressure Sister     Social History:  reports that he quit  smoking about 26 years ago. His smoking use included cigarettes. He started smoking about 45 years ago. He has a 9.50 pack-year smoking history. He has never used smokeless tobacco. He reports current alcohol use. He reports that he does not use drugs.  ROS: All other review of systems were reviewed and are negative except what is noted above in HPI  Physical Exam: BP 132/75   Pulse 89   Temp 99.1 F (37.3 C)   Ht 6\' 6"  (1.981 m)   Wt 259 lb 12.8 oz (117.8 kg)   BMI 30.02 kg/m   Constitutional:  Alert and oriented, No acute distress. HEENT: [place] AT, moist mucus membranes.  Trachea midline, no masses. Cardiovascular: No clubbing, cyanosis, or edema. Respiratory: Normal respiratory effort, no increased work of breathing. GI: Abdomen is soft, nontender, nondistended, no abdominal masses GU: No CVA tenderness. Circumcised phallus. No masses/lesions on penis, testis, scrotum. Prostate 40g smooth no nodules no induration.  Lymph: No cervical or inguinal lymphadenopathy. Skin: No rashes, bruises or suspicious lesions. Neurologic: Grossly intact, no focal deficits, moving all 4 extremities. Psychiatric: Normal mood and affect.  Laboratory  Data: Lab Results  Component Value Date   WBC 9.7 04/11/2019   HGB 14.0 04/11/2019   HCT 44.3 04/11/2019   MCV 105.7 (H) 04/11/2019   PLT 252 04/11/2019    Lab Results  Component Value Date   CREATININE 1.36 (H) 04/11/2019    No results found for: PSA  No results found for: TESTOSTERONE  Lab Results  Component Value Date   HGBA1C 90 12/30/2016    Urinalysis    Component Value Date/Time   COLORURINE YELLOW 01/19/2008 2204   [place] 01/19/2008 2204   LABSPEC <1.005 (L) 01/19/2008 2204   PHURINE 5.5 01/19/2008 [facility] 01/19/2008 2204   HGBUR NEGATIVE 01/19/2008 2204   BILIRUBINUR NEGATIVE 01/19/2008 2204   KETONESUR NEGATIVE 01/19/2008 2204   PROTEINUR NEGATIVE 01/19/2008 2204   UROBILINOGEN 0.2 01/19/2008  2204   NITRITE NEGATIVE 01/19/2008 2204   LEUKOCYTESUR  01/19/2008 2204    NEGATIVE MICROSCOPIC NOT DONE ON URINES WITH NEGATIVE PROTEIN, BLOOD, LEUKOCYTES, NITRITE, OR GLUCOSE <1000 mg/dL.    No results found for: LABMICR, [place], RBCUA, LABEPIT, MUCUS, BACTERIA  Pertinent Imaging:  No results found for this or any previous visit.  No results found for this or any previous visit.  No results found for this or any previous visit.  No results found for this or any previous visit.  No results found for this or any previous visit.  No results found for this or any previous visit.  No results found for this or any previous visit.  No results found for this or any previous visit.   Assessment & Plan:    1. Erectile dysfunction, unspecified erectile dysfunction type -patient defers treatment at this time - Urinalysis, Routine w reflex microscopic  2. BPH with Nocturia -Patient defers treatment at this time since he is not bothered by his LUTS  3. Elevated PSA: -PSA today, will call with results, if elevated we will proceed with MRI. If normal I will see him back in 6 months with a PSA  No follow-ups on file.  Nicolette Bang, MD  [facility] [place]

## 2019-12-19 NOTE — Patient Instructions (Signed)
Erectile Dysfunction Erectile dysfunction (ED) is the inability to get or keep an erection in order to have sexual intercourse. Erectile dysfunction may include:  Inability to get an erection.  Lack of enough hardness of the erection to allow penetration.  Loss of the erection before sex is finished. What are the causes? This condition may be caused by:  Certain medicines, such as: ? Pain relievers. ? Antihistamines. ? Antidepressants. ? Blood pressure medicines. ? Water pills (diuretics). ? Ulcer medicines. ? Muscle relaxants. ? Drugs.  Excessive drinking.  Psychological causes, such as: ? Anxiety. ? Depression. ? Sadness. ? Exhaustion. ? Performance fear. ? Stress.  Physical causes, such as: ? Artery problems. This may include diabetes, smoking, liver disease, or atherosclerosis. ? High blood pressure. ? Hormonal problems, such as low testosterone. ? Obesity. ? Nerve problems. This may include back or pelvic injuries, diabetes mellitus, multiple sclerosis, or Parkinson disease. What are the signs or symptoms? Symptoms of this condition include:  Inability to get an erection.  Lack of enough hardness of the erection to allow penetration.  Loss of the erection before sex is finished.  Normal erections at some times, but with frequent unsatisfactory episodes.  Low sexual satisfaction in either partner due to erection problems.  A curved penis occurring with erection. The curve may cause pain or the penis may be too curved to allow for intercourse.  Never having nighttime erections. How is this diagnosed? This condition is often diagnosed by:  Performing a physical exam to find other diseases or specific problems with the penis.  Asking you detailed questions about the problem.  Performing blood tests to check for diabetes mellitus or to measure hormone levels.  Performing other tests to check for underlying health conditions.  Performing an ultrasound  exam to check for scarring.  Performing a test to check blood flow to the penis.  Doing a sleep study at home to measure nighttime erections. How is this treated? This condition may be treated by:  Medicine taken by mouth to help you achieve an erection (oral medicine).  Hormone replacement therapy to replace low testosterone levels.  Medicine that is injected into the penis. Your health care provider may instruct you how to give yourself these injections at home.  Vacuum pump. This is a pump with a ring on it. The pump and ring are placed on the penis and used to create pressure that helps the penis become erect.  Penile implant surgery. In this procedure, you may receive: ? An inflatable implant. This consists of cylinders, a pump, and a reservoir. The cylinders can be inflated with a fluid that helps to create an erection, and they can be deflated after intercourse. ? A semi-rigid implant. This consists of two silicone rubber rods. The rods provide some rigidity. They are also flexible, so the penis can both curve downward in its normal position and become straight for sexual intercourse.  Blood vessel surgery, to improve blood flow to the penis. During this procedure, a blood vessel from a different part of the body is placed into the penis to allow blood to flow around (bypass) damaged or blocked blood vessels.  Lifestyle changes, such as exercising more, losing weight, and quitting smoking. Follow these instructions at home: Medicines   Take over-the-counter and prescription medicines only as told by your health care provider. Do not increase the dosage without first discussing it with your health care provider.  If you are using self-injections, perform injections as directed by your   health care provider. Make sure to avoid any veins that are on the surface of the penis. After giving an injection, apply pressure to the injection site for 5 minutes. General  instructions  Exercise regularly, as directed by your health care provider. Work with your health care provider to lose weight, if needed.  Do not use any products that contain nicotine or tobacco, such as cigarettes and e-cigarettes. If you need help quitting, ask your health care provider.  Before using a vacuum pump, read the instructions that come with the pump and discuss any questions with your health care provider.  Keep all follow-up visits as told by your health care provider. This is important. Contact a health care provider if:  You feel nauseous.  You vomit. Get help right away if:  You are taking oral or injectable medicines and you have an erection that lasts longer than 4 hours. If your health care provider is unavailable, go to the nearest emergency room for evaluation. An erection that lasts much longer than 4 hours can result in permanent damage to your penis.  You have severe pain in your groin or abdomen.  You develop redness or severe swelling of your penis.  You have redness spreading up into your groin or lower abdomen.  You are unable to urinate.  You experience chest pain or a rapid heart beat (palpitations) after taking oral medicines. Summary  Erectile dysfunction (ED) is the inability to get or keep an erection during sexual intercourse. This problem can usually be treated successfully.  This condition is diagnosed based on a physical exam, your symptoms, and tests to determine the cause. Treatment varies depending on the cause, and may include medicines, hormone therapy, surgery, or vacuum pump.  You may need follow-up visits to make sure that you are using your medicines or devices correctly.  Get help right away if you are taking or injecting medicines and you have an erection that lasts longer than 4 hours. This information is not intended to replace advice given to you by your health care provider. Make sure you discuss any questions you have with  your health care provider. Document Revised: 01/19/2017 Document Reviewed: 02/23/2016 Elsevier Patient Education  2020 Elsevier Inc.  

## 2019-12-20 LAB — PSA: Prostate Specific Ag, Serum: 5.4 ng/mL — ABNORMAL HIGH (ref 0.0–4.0)

## 2019-12-22 ENCOUNTER — Telehealth: Payer: Self-pay

## 2019-12-22 NOTE — Telephone Encounter (Signed)
Pt notified of PSA results. 

## 2020-02-03 ENCOUNTER — Other Ambulatory Visit (INDEPENDENT_AMBULATORY_CARE_PROVIDER_SITE_OTHER): Payer: Self-pay | Admitting: Internal Medicine

## 2020-02-03 DIAGNOSIS — M542 Cervicalgia: Secondary | ICD-10-CM

## 2020-02-17 ENCOUNTER — Other Ambulatory Visit (INDEPENDENT_AMBULATORY_CARE_PROVIDER_SITE_OTHER): Payer: Self-pay | Admitting: Internal Medicine

## 2020-02-17 DIAGNOSIS — M542 Cervicalgia: Secondary | ICD-10-CM

## 2020-04-07 ENCOUNTER — Other Ambulatory Visit: Payer: Self-pay | Admitting: Cardiology

## 2020-05-02 ENCOUNTER — Other Ambulatory Visit: Payer: Self-pay | Admitting: Cardiology

## 2020-05-05 ENCOUNTER — Other Ambulatory Visit (INDEPENDENT_AMBULATORY_CARE_PROVIDER_SITE_OTHER): Payer: Self-pay | Admitting: Internal Medicine

## 2020-05-05 DIAGNOSIS — M542 Cervicalgia: Secondary | ICD-10-CM

## 2020-05-14 ENCOUNTER — Other Ambulatory Visit: Payer: Self-pay | Admitting: Cardiology

## 2020-05-16 ENCOUNTER — Other Ambulatory Visit (INDEPENDENT_AMBULATORY_CARE_PROVIDER_SITE_OTHER): Payer: Self-pay | Admitting: Internal Medicine

## 2020-05-16 DIAGNOSIS — M542 Cervicalgia: Secondary | ICD-10-CM

## 2020-05-23 ENCOUNTER — Other Ambulatory Visit: Payer: Self-pay | Admitting: Cardiology

## 2020-05-26 ENCOUNTER — Telehealth (INDEPENDENT_AMBULATORY_CARE_PROVIDER_SITE_OTHER): Payer: Self-pay

## 2020-05-26 NOTE — Telephone Encounter (Signed)
Patient called and left a detailed voice message that he needs a refill of his Amlodipine from Dr. Carlyle Dolly.   I called the patient and left a detailed voice message to let him know to call Dr. Nelly Laurence office and schedule an appointment with him. I also left a message to let patient know that he is also due for a follow up with Dr. Anastasio Champion and to call us back to schedule this appointment. Left number for patient to call back.

## 2020-06-08 ENCOUNTER — Telehealth (INDEPENDENT_AMBULATORY_CARE_PROVIDER_SITE_OTHER): Payer: Self-pay | Admitting: Internal Medicine

## 2020-06-08 NOTE — Telephone Encounter (Signed)
Left message for patient to call back and schedule Medicare Annual Wellness Visit (AWV) either virtually or in office.   awvi 07/21/17 per [facility]    please schedule at anytime with  Nurse Health Advisor  This should be a 45 minute visit.

## 2020-06-09 ENCOUNTER — Telehealth (INDEPENDENT_AMBULATORY_CARE_PROVIDER_SITE_OTHER): Payer: Self-pay

## 2020-06-09 NOTE — Telephone Encounter (Signed)
Patient called an left a detailed voice message that Shirlean Mylar called and left him a message and patient was confused.  I called patient back and left a detailed voice message for him to call back to schedule his AWV with Judson Roch.

## 2020-06-11 ENCOUNTER — Other Ambulatory Visit: Payer: Medicare Other

## 2020-06-18 ENCOUNTER — Telehealth (INDEPENDENT_AMBULATORY_CARE_PROVIDER_SITE_OTHER): Payer: Medicare Other | Admitting: Urology

## 2020-06-18 ENCOUNTER — Other Ambulatory Visit: Payer: Self-pay

## 2020-06-18 DIAGNOSIS — N401 Enlarged prostate with lower urinary tract symptoms: Secondary | ICD-10-CM

## 2020-06-18 DIAGNOSIS — R972 Elevated prostate specific antigen [PSA]: Secondary | ICD-10-CM

## 2020-06-18 DIAGNOSIS — R351 Nocturia: Secondary | ICD-10-CM

## 2020-06-18 DIAGNOSIS — N529 Male erectile dysfunction, unspecified: Secondary | ICD-10-CM

## 2020-06-18 DIAGNOSIS — N138 Other obstructive and reflux uropathy: Secondary | ICD-10-CM

## 2020-06-18 NOTE — Progress Notes (Deleted)
06/18/2020 1:17 PM   Nathaniel Kline 05-05-1950 [id number]  Referring provider: Doree Albee, MD [address] [place],  [place] [address]  Patient location: home Physician location: office I connected with  Nathaniel Kline on 06/18/20 by a video enabled telemedicine application and verified that I am speaking with the correct person using two identifiers.   I discussed the limitations of evaluation and management by telemedicine. The patient expressed understanding and agreed to proceed.   Followup BPH and elevated PSA  HPI: Mr Locker is a 70yo here for followup for BPH, erectile dysfunction and elevated PSA. PSA was 5.4 in 11/2019 which has decreased from 9.8   PMH: Past Medical History:  Diagnosis Date  . Alcohol abuse   . HLD (hyperlipidemia)   . HTN (hypertension)     Surgical History: Past Surgical History:  Procedure Laterality Date  . 2 caths  10/21/05   caridac cath    Home Medications:  Allergies as of 06/18/2020      Reactions   Other    Peanut butter, carrots, celery      Medication List       Accurate as of June 18, 2020  1:17 PM. If you have any questions, ask your nurse or doctor.        amLODipine 10 MG tablet Commonly known as: NORVASC TAKE 1 TABLET BY MOUTH EVERY DAY   aspirin 81 MG EC tablet Take 81 mg by mouth daily.   multivitamin tablet Take 1 tablet by mouth daily.   tiZANidine 2 MG tablet Commonly known as: ZANAFLEX TAKE 1 TABLET (2 MG TOTAL) BY MOUTH 2 (TWO) TIMES DAILY AS NEEDED FOR MUSCLE SPASMS.   vitamin C 500 MG tablet Commonly known as: ASCORBIC ACID Take 500 mg by mouth daily.       Allergies:  Allergies  Allergen Reactions  . Other     Peanut butter, carrots, celery     Family History: Family History  Problem Relation Age of Onset  . Hypertension Other        family hx  . Diabetes Mother   . High blood pressure Mother   . High blood pressure Sister   . High blood pressure Brother   . Cancer Brother   .  High blood pressure Sister     Social History:  reports that he quit smoking about 26 years ago. His smoking use included cigarettes. He started smoking about 45 years ago. He has a 9.50 pack-year smoking history. He has never used smokeless tobacco. He reports current alcohol use. He reports that he does not use drugs.  ROS: All other review of systems were reviewed and are negative except what is noted above in HPI  Physical Exam: There were no vitals taken for this visit.  Constitutional:  Alert and oriented, No acute distress. HEENT: [place] AT, moist mucus membranes.  Trachea midline, no masses. Cardiovascular: No clubbing, cyanosis, or edema. Respiratory: Normal respiratory effort, no increased work of breathing. GI: Abdomen is soft, nontender, nondistended, no abdominal masses GU: No CVA tenderness.  Lymph: No cervical or inguinal lymphadenopathy. Skin: No rashes, bruises or suspicious lesions. Neurologic: Grossly intact, no focal deficits, moving all 4 extremities. Psychiatric: Normal mood and affect.  Laboratory Data: Lab Results  Component Value Date   WBC 9.7 04/11/2019   HGB 14.0 04/11/2019   HCT 44.3 04/11/2019   MCV 105.7 (H) 04/11/2019   PLT 252 04/11/2019    Lab Results  Component  Value Date   CREATININE 1.36 (H) 04/11/2019    No results found for: PSA  No results found for: TESTOSTERONE  Lab Results  Component Value Date   HGBA1C 90 12/30/2016    Urinalysis    Component Value Date/Time   COLORURINE YELLOW 01/19/2008 [place] 01/19/2008 2204   LABSPEC <1.005 (L) 01/19/2008 2204   PHURINE 5.5 01/19/2008 2204   GLUCOSEU NEGATIVE 01/19/2008 2204   HGBUR NEGATIVE 01/19/2008 2204   BILIRUBINUR NEGATIVE 01/19/2008 2204   KETONESUR NEGATIVE 01/19/2008 2204   PROTEINUR NEGATIVE 01/19/2008 2204   UROBILINOGEN 0.2 01/19/2008 2204   NITRITE NEGATIVE 01/19/2008 2204   LEUKOCYTESUR  01/19/2008 2204    NEGATIVE MICROSCOPIC NOT DONE ON URINES  WITH NEGATIVE PROTEIN, BLOOD, LEUKOCYTES, NITRITE, OR GLUCOSE <1000 mg/dL.    No results found for: LABMICR, WBCUA, RBCUA, LABEPIT, MUCUS, BACTERIA  Pertinent Imaging: *** No results found for this or any previous visit.  No results found for this or any previous visit.  No results found for this or any previous visit.  No results found for this or any previous visit.  No results found for this or any previous visit.  No results found for this or any previous visit.  No results found for this or any previous visit.  No results found for this or any previous visit.   Assessment & Plan:    1. Erectile dysfunction, unspecified erectile dysfunction type ***  2. Elevated PSA ***  3. Benign prostatic hyperplasia with urinary obstruction ***  4. Nocturia ***   No follow-ups on file.  Nicolette Bang, MD  [facility] [place]

## 2020-06-18 NOTE — Patient Instructions (Signed)
Prostate Cancer Screening  Prostate cancer screening is a test that is done to check for the presence of prostate cancer in men. The prostate gland is a walnut-sized gland that is located below the bladder and in front of the rectum in males. The function of the prostate is to add fluid to semen during ejaculation. Prostate cancer is the second most common type of cancer in men. Who should have prostate cancer screening?  Screening recommendations vary based on age and other risk factors. Screening is recommended if:  You are older than age 55. If you are age 55-69, talk with your health care provider about your need for screening and how often screening should be done. Because most prostate cancers are slow growing and will not cause death, screening is generally reserved in this age group for men who have a 10-15-year life expectancy.  You are younger than age 55, and you have these risk factors: ? Being a black male or a male of African descent. ? Having a father, brother, or uncle who has been diagnosed with prostate cancer. The risk is higher if your family member's cancer occurred at an early age. Screening is not recommended if:  You are younger than age 40.  You are between the ages of 40 and 54 and you have no risk factors.  You are 70 years of age or older. At this age, the risks that screening can cause are greater than the benefits that it may provide. If you are at high risk for prostate cancer, your health care provider may recommend that you have screenings more often or that you start screening at a younger age. How is screening for prostate cancer done? The recommended prostate cancer screening test is a blood test called the prostate-specific antigen (PSA) test. PSA is a protein that is made in the prostate. As you age, your prostate naturally produces more PSA. Abnormally high PSA levels may be caused by:  Prostate cancer.  An enlarged prostate that is not caused by cancer  (benign prostatic hyperplasia, BPH). This condition is very common in older men.  A prostate gland infection (prostatitis). Depending on the PSA results, you may need more tests, such as:  A physical exam to check the size of your prostate gland.  Blood and imaging tests.  A procedure to remove tissue samples from your prostate gland for testing (biopsy). What are the benefits of prostate cancer screening?  Screening can help to identify cancer at an early stage, before symptoms start and when the cancer can be treated more easily.  There is a small chance that screening may lower your risk of dying from prostate cancer. The chance is small because prostate cancer is a slow-growing cancer, and most men with prostate cancer die from a different cause. What are the risks of prostate cancer screening? The main risk of prostate cancer screening is diagnosing and treating prostate cancer that would never have caused any symptoms or problems. This is called overdiagnosisand overtreatment. PSA screening cannot tell you if your PSA is high due to cancer or a different cause. A prostate biopsy is the only procedure to diagnose prostate cancer. Even the results of a biopsy may not tell you if your cancer needs to be treated. Slow-growing prostate cancer may not need any treatment other than monitoring, so diagnosing and treating it may cause unnecessary stress or other side effects. A prostate biopsy may also cause:  Infection or fever.  A false negative. This is   a result that shows that you do not have prostate cancer when you actually do have prostate cancer. Questions to ask your health care provider  When should I start prostate cancer screening?  What is my risk for prostate cancer?  How often do I need screening?  What type of screening tests do I need?  How do I get my test results?  What do my results mean?  Do I need treatment? Where to find more information  The American Cancer  Society: www.cancer.org  American Urological Association: www.auanet.org Contact a health care provider if:  You have difficulty urinating.  You have pain when you urinate or ejaculate.  You have blood in your urine or semen.  You have pain in your back or in the area of your prostate. Summary  Prostate cancer is a common type of cancer in men. The prostate gland is located below the bladder and in front of the rectum. This gland adds fluid to semen during ejaculation.  Prostate cancer screening may identify cancer at an early stage, when the cancer can be treated more easily.  The prostate-specific antigen (PSA) test is the recommended screening test for prostate cancer.  Discuss the risks and benefits of prostate cancer screening with your health care provider. If you are age 69 or older, the risks that screening can cause are greater than the benefits that it may provide. This information is not intended to replace advice given to you by your health care provider. Make sure you discuss any questions you have with your health care provider. Document Revised: 05/30/2019 Document Reviewed: 09/19/2018 Elsevier Patient Education  [place].

## 2020-06-29 NOTE — Progress Notes (Signed)
Patient rescheduled

## 2020-07-02 ENCOUNTER — Other Ambulatory Visit: Payer: Medicare Other

## 2020-07-22 ENCOUNTER — Other Ambulatory Visit (INDEPENDENT_AMBULATORY_CARE_PROVIDER_SITE_OTHER): Payer: Self-pay

## 2020-07-22 MED ORDER — AMLODIPINE BESYLATE 10 MG PO TABS: 1.0000 | ORAL_TABLET | Freq: Every day | ORAL | 0 refills | Status: DC

## 2020-08-04 ENCOUNTER — Other Ambulatory Visit: Payer: Self-pay

## 2020-08-04 ENCOUNTER — Ambulatory Visit (INDEPENDENT_AMBULATORY_CARE_PROVIDER_SITE_OTHER): Payer: Medicare Other | Admitting: Nurse Practitioner

## 2020-08-04 ENCOUNTER — Encounter (INDEPENDENT_AMBULATORY_CARE_PROVIDER_SITE_OTHER): Payer: Self-pay | Admitting: Nurse Practitioner

## 2020-08-04 ENCOUNTER — Other Ambulatory Visit (INDEPENDENT_AMBULATORY_CARE_PROVIDER_SITE_OTHER): Payer: Self-pay | Admitting: Internal Medicine

## 2020-08-04 VITALS — BP 115/64 | HR 83 | Temp 97.3°F | Resp 18 | Ht 78.0 in | Wt 267.4 lb

## 2020-08-04 DIAGNOSIS — R42 Dizziness and giddiness: Secondary | ICD-10-CM

## 2020-08-04 DIAGNOSIS — R972 Elevated prostate specific antigen [PSA]: Secondary | ICD-10-CM

## 2020-08-04 DIAGNOSIS — E785 Hyperlipidemia, unspecified: Secondary | ICD-10-CM

## 2020-08-04 DIAGNOSIS — I10 Essential (primary) hypertension: Secondary | ICD-10-CM

## 2020-08-04 NOTE — Progress Notes (Signed)
Subjective:  Patient ID: Nathaniel Kline, male    DOB: Feb 17, 1951  Age: 70 y.o. MRN: [id number]  CC:  Chief Complaint  Patient presents with   Hypertension   Hyperlipidemia   Other    History of elevated PSA, dizziness      HPI  This patient arrives today for the above.  Hypertension: He continues on amlodipine is tolerating medication well.  Hyperlipidemia: He continues on low-dose aspirin.  He is due to have lipid panel checked today.  Elevated PSA: Last PSA was collected in October 2021 by urology.  It was 5.4.  Per urology notes PSA was actually improved at a level of 5.4 and they recommended he follow-up in 6 months to recheck.  He is overdue for this appointment and plans on giving the office a call to get this scheduled.  Dizziness: He complains of some intermittent dizziness that has been occurring for about a week.  He tells me initially started after he was drinking some alcohol, but over the last few days has continued.  He tells me it comes and goes and seems to worsen with position changes.  He drinks about 60 ounces of water a day.  He denies any chest pain or cardiac palpitations or shortness of breath.  He denies any syncope.  Past Medical History:  Diagnosis Date   Alcohol abuse    HLD (hyperlipidemia)    HTN (hypertension)       Family History  Problem Relation Age of Onset   Hypertension Other        family hx   Diabetes Mother    High blood pressure Mother    High blood pressure Sister    High blood pressure Brother    Cancer Brother    High blood pressure Sister     Social History   Social History Narrative   Single.Disabled, does not get regular exercise.    Social History   Tobacco Use   Smoking status: Former    Packs/day: 0.50    Years: 19.00    Pack years: 9.50    Types: Cigarettes    Start date: 08/21/1974    Quit date: 08/20/1993    Years since quitting: 26.9   Smokeless tobacco: Never   Tobacco comments:    tobacco use - no   Substance Use Topics   Alcohol use: Yes    Comment: 1/2 pint at weekends     Current Meds  Medication Sig   amLODipine (NORVASC) 10 MG tablet Take 1 tablet (10 mg total) by mouth daily.   aspirin 81 MG EC tablet Take 81 mg by mouth daily.     Multiple Vitamin (MULTIVITAMIN) tablet Take 1 tablet by mouth daily.     tiZANidine (ZANAFLEX) 2 MG tablet TAKE 1 TABLET (2 MG TOTAL) BY MOUTH 2 (TWO) TIMES DAILY AS NEEDED FOR MUSCLE SPASMS.   vitamin C (ASCORBIC ACID) 500 MG tablet Take 500 mg by mouth daily.    ROS:  Review of Systems  Respiratory:  Negative for shortness of breath.   Cardiovascular:  Negative for chest pain and palpitations.  Neurological:  Positive for dizziness. Negative for loss of consciousness.    Objective:   Today's Vitals: BP 115/64   Pulse 83   Temp (!) 97.3 F (36.3 C)   Resp 18   Ht 6' 6" (1.981 m)   Wt 267 lb 6.4 oz (121.3 kg)   SpO2 98%   BMI 30.90 kg/m  Vitals with BMI 08/04/2020 08/04/2020 12/19/2019  Height - 6' 6" 6' 6"  Weight - 267 lbs 6 oz 259 lbs 13 oz  BMI - 57.84 69.62  Systolic 952 841 324  Diastolic 64 78 75  Pulse 83 87 89     Physical Exam Vitals reviewed.  Constitutional:      Appearance: Normal appearance.  HENT:     Head: Normocephalic and atraumatic.  Cardiovascular:     Rate and Rhythm: Normal rate and regular rhythm.  Pulmonary:     Effort: Pulmonary effort is normal.     Breath sounds: Normal breath sounds.  Musculoskeletal:     Cervical back: Neck supple.  Skin:    General: Skin is warm and dry.  Neurological:     Mental Status: He is alert and oriented to person, place, and time.  Psychiatric:        Mood and Affect: Mood normal.        Behavior: Behavior normal.        Thought Content: Thought content normal.        Judgment: Judgment normal.         Assessment and Plan   1. Essential hypertension, benign   2. Hyperlipidemia, unspecified hyperlipidemia type   3. Dizziness   4. Elevated PSA       Plan: 1.  He will continue on his amlodipine as currently prescribed. 2.  We will check lipid panel and metabolic panel for further evaluation. 3.  I think most likely this is related to some mild dehydration secondary to the hot weather and alcohol intake.  I recommend he try to increase his intake of water to about 120 ounces a day.  We will check additional blood work for further evaluation today.  If blood work looks normal and he continues to have dizziness may refer him to cardiology for further work-up. 4.  He was encouraged to follow-up with his urologist to schedule follow-up to closely monitor his elevated PSA.   Tests ordered Orders Placed This Encounter  Procedures   CMP with eGFR([facility])   Lipid Panel   Hemoglobin A1c   TSH   T3, Free   T4, Free   CBC      No orders of the defined types were placed in this encounter.   Patient to follow-up in 3 months or sooner pending blood work results or as needed.  Ailene Ards, NP

## 2020-08-04 NOTE — Telephone Encounter (Signed)
HE DEFINITELY HAS TO COME IN  TO BE SEEN. NOT BEEN IN A VERY LONG TIME.

## 2020-08-04 NOTE — Patient Instructions (Addendum)
Call Dr. Noland Fordyce office to schedule follow-up regarding following PSA levels -- [phone]

## 2020-08-04 NOTE — Telephone Encounter (Signed)
Ok just called and remind him to come at 4pm today.

## 2020-08-05 ENCOUNTER — Other Ambulatory Visit (INDEPENDENT_AMBULATORY_CARE_PROVIDER_SITE_OTHER): Payer: Self-pay | Admitting: Nurse Practitioner

## 2020-08-05 DIAGNOSIS — R748 Abnormal levels of other serum enzymes: Secondary | ICD-10-CM

## 2020-08-05 LAB — COMPLETE METABOLIC PANEL WITH GFR
AG Ratio: 1.3 (calc) (ref 1.0–2.5)
ALT: 79 U/L — ABNORMAL HIGH (ref 9–46)
AST: 83 U/L — ABNORMAL HIGH (ref 10–35)
Albumin: 4.5 g/dL (ref 3.6–5.1)
Alkaline phosphatase (APISO): 46 U/L (ref 35–144)
BUN/Creatinine Ratio: 17 (calc) (ref 6–22)
BUN: 25 mg/dL (ref 7–25)
CO2: 25 mmol/L (ref 20–32)
Calcium: 9.7 mg/dL (ref 8.6–10.3)
Chloride: 99 mmol/L (ref 98–110)
Creat: 1.47 mg/dL — ABNORMAL HIGH (ref 0.70–1.25)
GFR, Est African American: 56 mL/min/{1.73_m2} — ABNORMAL LOW (ref >=60)
GFR, Est Non African American: 48 mL/min/{1.73_m2} — ABNORMAL LOW (ref >=60)
Globulin: 3.4 g/dL (calc) (ref 1.9–3.7)
Glucose, Bld: 86 mg/dL (ref 65–139)
Potassium: 4.3 mmol/L (ref 3.5–5.3)
Sodium: 136 mmol/L (ref 135–146)
Total Bilirubin: 0.8 mg/dL (ref 0.2–1.2)
Total Protein: 7.9 g/dL (ref 6.1–8.1)

## 2020-08-05 LAB — TSH: TSH: 6.79 mIU/L — ABNORMAL HIGH (ref 0.40–4.50)

## 2020-08-05 LAB — CBC
HCT: 45 % (ref 38.5–50.0)
Hemoglobin: 15.2 g/dL (ref 13.2–17.1)
MCH: 33.9 pg — ABNORMAL HIGH (ref 27.0–33.0)
MCHC: 33.8 g/dL (ref 32.0–36.0)
MCV: 100.2 fL — ABNORMAL HIGH (ref 80.0–100.0)
MPV: 10.6 fL (ref 7.5–12.5)
Platelets: 229 10*3/uL (ref 140–400)
RBC: 4.49 10*6/uL (ref 4.20–5.80)
RDW: 12.1 % (ref 11.0–15.0)
WBC: 8 10*3/uL (ref 3.8–10.8)

## 2020-08-05 LAB — T3, FREE: T3, Free: 3.9 pg/mL (ref 2.3–4.2)

## 2020-08-05 LAB — LIPID PANEL
Cholesterol: 205 mg/dL — ABNORMAL HIGH (ref <200)
HDL: 41 mg/dL (ref >=40)
LDL Cholesterol (Calc): 135 mg/dL (calc) — ABNORMAL HIGH
Non-HDL Cholesterol (Calc): 164 mg/dL (calc) — ABNORMAL HIGH (ref <130)
Total CHOL/HDL Ratio: 5 (calc) — ABNORMAL HIGH (ref <5.0)
Triglycerides: 158 mg/dL — ABNORMAL HIGH (ref <150)

## 2020-08-05 LAB — T4, FREE: Free T4: 1.3 ng/dL (ref 0.8–1.8)

## 2020-08-05 NOTE — Progress Notes (Signed)
Nellie, I am ordering ultrasound of the abdomen for this patient for further evaluation of his elevated liver enzymes.  Please work on getting this scheduled when able.  Santiago Glad, please call this patient and get him scheduled for a telephone visit with me within the next couple of weeks so I can discuss his blood work.  Thank you.

## 2020-08-05 NOTE — Progress Notes (Signed)
He is scheduled for Wed 22nd at 9:20 for phone visit

## 2020-08-05 NOTE — Progress Notes (Signed)
Ok got it will get him set up soon.

## 2020-08-11 ENCOUNTER — Other Ambulatory Visit (INDEPENDENT_AMBULATORY_CARE_PROVIDER_SITE_OTHER): Payer: Self-pay | Admitting: Internal Medicine

## 2020-08-11 ENCOUNTER — Telehealth (INDEPENDENT_AMBULATORY_CARE_PROVIDER_SITE_OTHER): Payer: Medicare Other | Admitting: Nurse Practitioner

## 2020-08-11 DIAGNOSIS — M542 Cervicalgia: Secondary | ICD-10-CM

## 2020-08-20 ENCOUNTER — Ambulatory Visit (HOSPITAL_COMMUNITY): Payer: Medicare Other

## 2020-08-25 ENCOUNTER — Other Ambulatory Visit (INDEPENDENT_AMBULATORY_CARE_PROVIDER_SITE_OTHER): Payer: Self-pay | Admitting: Internal Medicine

## 2020-08-25 DIAGNOSIS — M542 Cervicalgia: Secondary | ICD-10-CM

## 2020-09-08 ENCOUNTER — Telehealth (INDEPENDENT_AMBULATORY_CARE_PROVIDER_SITE_OTHER): Payer: Self-pay

## 2020-09-08 ENCOUNTER — Other Ambulatory Visit (INDEPENDENT_AMBULATORY_CARE_PROVIDER_SITE_OTHER): Payer: Self-pay | Admitting: Internal Medicine

## 2020-09-08 DIAGNOSIS — E669 Obesity, unspecified: Secondary | ICD-10-CM

## 2020-09-08 DIAGNOSIS — Z131 Encounter for screening for diabetes mellitus: Secondary | ICD-10-CM

## 2020-09-08 NOTE — Telephone Encounter (Signed)
Patient called and left a voice message that he wants to have his blood glucose checked because he thinks it is high. I do see an order for a Hemoglobin A1c but not released yet. Do you want to add orders for a lab visit or do you want me to see if there is an opening for an acute visit?

## 2020-09-08 NOTE — Telephone Encounter (Signed)
Called patient and scheduled him for a lab appointment tomorrow 09/09/2020 at 3pm and let patient know that once we get his lab results we will know how to proceed with patient care. Patient also stated that he is having increased urination during the night and also increased thirst during the day and he does feel a little off balance and let Judson Roch know at his last visit. Patient verbalized an understanding.

## 2020-09-08 NOTE — Telephone Encounter (Signed)
He can come in for hemoglobin A1c.  I put the order in.  He was seen by Judson Roch just over a month ago and she had ordered hemoglobin A1c but for some reason it has not resulted.

## 2020-09-09 ENCOUNTER — Other Ambulatory Visit (INDEPENDENT_AMBULATORY_CARE_PROVIDER_SITE_OTHER): Payer: Medicare Other

## 2020-09-13 ENCOUNTER — Other Ambulatory Visit (INDEPENDENT_AMBULATORY_CARE_PROVIDER_SITE_OTHER): Payer: Medicare Other

## 2020-09-29 ENCOUNTER — Ambulatory Visit (INDEPENDENT_AMBULATORY_CARE_PROVIDER_SITE_OTHER): Payer: Medicare Other | Admitting: Urology

## 2020-09-29 ENCOUNTER — Encounter: Payer: Self-pay | Admitting: Urology

## 2020-09-29 ENCOUNTER — Other Ambulatory Visit: Payer: Self-pay

## 2020-09-29 VITALS — BP 124/69 | HR 111

## 2020-09-29 DIAGNOSIS — N529 Male erectile dysfunction, unspecified: Secondary | ICD-10-CM | POA: Diagnosis not present

## 2020-09-29 DIAGNOSIS — R972 Elevated prostate specific antigen [PSA]: Secondary | ICD-10-CM

## 2020-09-29 DIAGNOSIS — N401 Enlarged prostate with lower urinary tract symptoms: Secondary | ICD-10-CM

## 2020-09-29 DIAGNOSIS — R351 Nocturia: Secondary | ICD-10-CM

## 2020-09-29 DIAGNOSIS — R748 Abnormal levels of other serum enzymes: Secondary | ICD-10-CM

## 2020-09-29 DIAGNOSIS — N138 Other obstructive and reflux uropathy: Secondary | ICD-10-CM | POA: Diagnosis not present

## 2020-09-29 NOTE — Progress Notes (Signed)
Urological Symptom Review  Patient is experiencing the following symptoms: Leakage of urine Stream starts and stops Weak stream   Review of Systems  Gastrointestinal (upper)  : Indigestion/heartburn  Gastrointestinal (lower) : Negative for lower GI symptoms  Constitutional : Fatigue  Skin: Itching  Eyes: Negative for eye symptoms  Ear/Nose/Throat : Sinus problems  Hematologic/Lymphatic: Negative for Hematologic/Lymphatic symptoms  Cardiovascular : Chest pain (sometimes)  Respiratory : Negative for respiratory symptoms  Endocrine: Negative for endocrine symptoms  Musculoskeletal: Back pain  Neurological: Dizziness  Psychologic: Negative for psychiatric symptoms

## 2020-09-29 NOTE — Progress Notes (Signed)
09/29/2020 3:00 PM   Nathaniel Kline November 22, 1950 [id number]  Referring provider: Doree Albee, MD No address on file  Followup elevated PSA and Erectile dysfunction   HPI: Mr Nathaniel Kline is a 70yo here for followup for elevated PSA and erectile dysfunction. He has worsening nocturia 3-4x.  IPSS 21 QOL 5. He notes his erections have improved and he has no difficulty getting or maintaining an erection.   PMH: Past Medical History:  Diagnosis Date   Alcohol abuse    HLD (hyperlipidemia)    HTN (hypertension)     Surgical History: Past Surgical History:  Procedure Laterality Date   2 caths  10/21/05   caridac cath    Home Medications:  Allergies as of 09/29/2020       Reactions   Other    Peanut butter, carrots, celery        Medication List        Accurate as of September 29, 2020  3:00 PM. If you have any questions, ask your nurse or doctor.          amLODipine 10 MG tablet Commonly known as: NORVASC TAKE 1 TABLET BY MOUTH EVERY DAY   aspirin 81 MG EC tablet Take 81 mg by mouth daily.   multivitamin tablet Take 1 tablet by mouth daily.   tiZANidine 2 MG tablet Commonly known as: ZANAFLEX TAKE 1 TABLET (2 MG TOTAL) BY MOUTH 2 (TWO) TIMES DAILY AS NEEDED FOR MUSCLE SPASMS.   vitamin C 500 MG tablet Commonly known as: ASCORBIC ACID Take 500 mg by mouth daily.        Allergies:  Allergies  Allergen Reactions   Other     Peanut butter, carrots, celery     Family History: Family History  Problem Relation Age of Onset   Hypertension Other        family hx   Diabetes Mother    High blood pressure Mother    High blood pressure Sister    High blood pressure Brother    Cancer Brother    High blood pressure Sister     Social History:  reports that he quit smoking about 27 years ago. His smoking use included cigarettes. He started smoking about 46 years ago. He has a 9.50 pack-year smoking history. He has never used smokeless tobacco. He reports  current alcohol use. He reports that he does not use drugs.  ROS: All other review of systems were reviewed and are negative except what is noted above in HPI  Physical Exam: BP 124/69   Pulse (!) 111   Constitutional:  Alert and oriented, No acute distress. HEENT: [place] AT, moist mucus membranes.  Trachea midline, no masses. Cardiovascular: No clubbing, cyanosis, or edema. Respiratory: Normal respiratory effort, no increased work of breathing. GI: Abdomen is soft, nontender, nondistended, no abdominal masses GU: No CVA tenderness.  Lymph: No cervical or inguinal lymphadenopathy. Skin: No rashes, bruises or suspicious lesions. Neurologic: Grossly intact, no focal deficits, moving all 4 extremities. Psychiatric: Normal mood and affect.  Laboratory Data: Lab Results  Component Value Date   WBC 8.0 08/04/2020   HGB 15.2 08/04/2020   HCT 45.0 08/04/2020   MCV 100.2 (H) 08/04/2020   PLT 229 08/04/2020    Lab Results  Component Value Date   CREATININE 1.47 (H) 08/04/2020    No results found for: PSA  No results found for: TESTOSTERONE  Lab Results  Component Value Date   HGBA1C 90 12/30/2016  Urinalysis    Component Value Date/Time   COLORURINE YELLOW 01/19/2008 2204   APPEARANCEUR CLEAR 01/19/2008 2204   LABSPEC <1.005 (L) 01/19/2008 2204   PHURINE 5.5 01/19/2008 [place] 01/19/2008 2204   HGBUR NEGATIVE 01/19/2008 2204   BILIRUBINUR NEGATIVE 01/19/2008 2204   KETONESUR NEGATIVE 01/19/2008 2204   PROTEINUR NEGATIVE 01/19/2008 2204   UROBILINOGEN 0.2 01/19/2008 2204   NITRITE NEGATIVE 01/19/2008 2204   LEUKOCYTESUR  01/19/2008 2204    NEGATIVE MICROSCOPIC NOT DONE ON URINES WITH NEGATIVE PROTEIN, BLOOD, LEUKOCYTES, NITRITE, OR GLUCOSE <1000 mg/dL.    No results found for: LABMICR, [place], RBCUA, LABEPIT, MUCUS, BACTERIA  Pertinent Imaging:  No results found for this or any previous visit.  No results found for this or any previous  visit.  No results found for this or any previous visit.  No results found for this or any previous visit.  No results found for this or any previous visit.  No results found for this or any previous visit.  No results found for this or any previous visit.  No results found for this or any previous visit.   Assessment & Plan:    1. Erectile dysfunction, unspecified erectile dysfunction type -resolved  2. Elevated PSA -PSA today, if stable RTC 6 months with PSA  - Urinalysis, Routine w reflex microscopic  3. BPH with LUTS, nocturia -We will start uroxatral '10mg'$     No follow-ups on file.  Nicolette Bang, MD  [facility] [place]

## 2020-09-29 NOTE — Patient Instructions (Signed)
Benign Prostatic Hyperplasia  Benign prostatic hyperplasia (BPH) is an enlarged prostate gland that is caused by the normal aging process and not by cancer. The prostate is a walnut-sized gland that is involved in the production of semen. It is located in front of the rectum and below the bladder. The bladder stores urine and the urethra is the tube that carries the urine out of the body. The prostate may get bigger asa man gets older. An enlarged prostate can press on the urethra. This can make it harder to pass urine. The build-up of urine in the bladder can cause infection. Back pressure and infection may progress to bladder damage and kidney (renal) failure. What are the causes? This condition is part of a normal aging process. However, not all men develop problems from this condition. If the prostate enlarges away from the urethra, urine flow will not be blocked. If it enlarges toward the urethra andcompresses it, there will be problems passing urine. What increases the risk? This condition is more likely to develop in men over the age of 50 years. What are the signs or symptoms? Symptoms of this condition include: Getting up often during the night to urinate. Needing to urinate frequently during the day. Difficulty starting urine flow. Decrease in size and strength of your urine stream. Leaking (dribbling) after urinating. Inability to pass urine. This needs immediate treatment. Inability to completely empty your bladder. Pain when you pass urine. This is more common if there is also an infection. Urinary tract infection (UTI). How is this diagnosed? This condition is diagnosed based on your medical history, a physical exam, and your symptoms. Tests will also be done, such as: A post-void bladder scan. This measures any amount of urine that may remain in your bladder after you finish urinating. A digital rectal exam. In a rectal exam, your health care provider checks your prostate by  putting a lubricated, gloved finger into your rectum to feel the back of your prostate gland. This exam detects the size of your gland and any abnormal lumps or growths. An exam of your urine (urinalysis). A prostate specific antigen (PSA) screening. This is a blood test used to screen for prostate cancer. An ultrasound. This test uses sound waves to electronically produce a picture of your prostate gland. Your health care provider may refer you to a specialist in kidney and prostate diseases (urologist). How is this treated? Once symptoms begin, your health care provider will monitor your condition (active surveillance or watchful waiting). Treatment for this condition will depend on the severity of your condition. Treatment may include: Observation and yearly exams. This may be the only treatment needed if your condition and symptoms are mild. Medicines to relieve your symptoms, including: Medicines to shrink the prostate. Medicines to relax the muscle of the prostate. Surgery in severe cases. Surgery may include: Prostatectomy. In this procedure, the prostate tissue is removed completely through an open incision or with a laparoscope or robotics. Transurethral resection of the prostate (TURP). In this procedure, a tool is inserted through the opening at the tip of the penis (urethra). It is used to cut away tissue of the inner core of the prostate. The pieces are removed through the same opening of the penis. This removes the blockage. Transurethral incision (TUIP). In this procedure, small cuts are made in the prostate. This lessens the prostate's pressure on the urethra. Transurethral microwave thermotherapy (TUMT). This procedure uses microwaves to create heat. The heat destroys and removes a small   amount of prostate tissue. Transurethral needle ablation (TUNA). This procedure uses radio frequencies to destroy and remove a small amount of prostate tissue. Interstitial laser coagulation (ILC).  This procedure uses a laser to destroy and remove a small amount of prostate tissue. Transurethral electrovaporization (TUVP). This procedure uses electrodes to destroy and remove a small amount of prostate tissue. Prostatic urethral lift. This procedure inserts an implant to push the lobes of the prostate away from the urethra. Follow these instructions at home: Take over-the-counter and prescription medicines only as told by your health care provider. Monitor your symptoms for any changes. Contact your health care provider with any changes. Avoid drinking large amounts of liquid before going to bed or out in public. Avoid or reduce how much caffeine or alcohol you drink. Give yourself time when you urinate. Keep all follow-up visits as told by your health care provider. This is important. Contact a health care provider if: You have unexplained back pain. Your symptoms do not get better with treatment. You develop side effects from the medicine you are taking. Your urine becomes very dark or has a bad smell. Your lower abdomen becomes distended and you have trouble passing your urine. Get help right away if: You have a fever or chills. You suddenly cannot urinate. You feel lightheaded, or very dizzy, or you faint. There are large amounts of blood or clots in the urine. Your urinary problems become hard to manage. You develop moderate to severe low back or flank pain. The flank is the side of your body between the ribs and the hip. These symptoms may represent a serious problem that is an emergency. Do not wait to see if the symptoms will go away. Get medical help right away. Call your local emergency services (911 in the [place] Do not drive yourself to the hospital. Summary Benign prostatic hyperplasia (BPH) is an enlarged prostate that is caused by the normal aging process and not by cancer. An enlarged prostate can press on the urethra. This can make it hard to pass urine. This  condition is part of a normal aging process and is more likely to develop in men over the age of 50 years. Get help right away if you suddenly cannot urinate. This information is not intended to replace advice given to you by your health care provider. Make sure you discuss any questions you have with your healthcare provider. Document Revised: 10/16/2019 Document Reviewed: 10/16/2019 Elsevier Patient Education  2022 [employer]  

## 2020-09-29 NOTE — Addendum Note (Signed)
Addended by: Pollyann Kennedy F on: 09/29/2020 03:12 PM   Modules accepted: Orders

## 2020-09-30 LAB — URINALYSIS, ROUTINE W REFLEX MICROSCOPIC
Bilirubin, UA: NEGATIVE
Glucose, UA: NEGATIVE
Nitrite, UA: NEGATIVE
RBC, UA: NEGATIVE
Specific Gravity, UA: >1.03 — ABNORMAL HIGH (ref 1.005–1.030)
Urobilinogen, Ur: 1 mg/dL (ref 0.2–1.0)
pH, UA: 5.5 (ref 5.0–7.5)

## 2020-09-30 LAB — MICROSCOPIC EXAMINATION
Bacteria, UA: NONE SEEN
RBC, Urine: NONE SEEN /hpf (ref 0–2)
Renal Epithel, UA: NONE SEEN /hpf

## 2020-09-30 LAB — PSA: Prostate Specific Ag, Serum: 8 ng/mL — ABNORMAL HIGH (ref 0.0–4.0)

## 2020-10-13 ENCOUNTER — Other Ambulatory Visit: Payer: Self-pay

## 2020-10-13 ENCOUNTER — Other Ambulatory Visit (INDEPENDENT_AMBULATORY_CARE_PROVIDER_SITE_OTHER): Payer: Self-pay | Admitting: Nurse Practitioner

## 2020-10-13 DIAGNOSIS — N138 Other obstructive and reflux uropathy: Secondary | ICD-10-CM

## 2020-10-13 DIAGNOSIS — N401 Enlarged prostate with lower urinary tract symptoms: Secondary | ICD-10-CM

## 2020-10-13 NOTE — Progress Notes (Signed)
Opened in error

## 2020-10-13 NOTE — Telephone Encounter (Signed)
Patient called today to inquire about recent lab work. Discussed with patient Dr. Alyson Ingles went on vacation after his office visit and is just returning. I would make sure he reviews his lab work this week. Patient voiced understanding.  Patient also called to ask about medication- reviewed last OV note and alfuzosin '10mg'$  was documented. Sent in rx for patient.

## 2020-10-14 MED ORDER — ALFUZOSIN HCL ER 10 MG PO TB24: 10.0000 mg | ORAL_TABLET | Freq: Every day | ORAL | 3 refills | Status: DC

## 2020-10-15 ENCOUNTER — Telehealth: Payer: Self-pay

## 2020-10-15 DIAGNOSIS — R972 Elevated prostate specific antigen [PSA]: Secondary | ICD-10-CM

## 2020-10-15 MED ORDER — LEVOFLOXACIN 750 MG PO TABS: 750.0000 mg | ORAL_TABLET | Freq: Once | ORAL | 0 refills | Status: AC

## 2020-10-15 NOTE — Telephone Encounter (Signed)
Patient called with no answer, message left to return call to office. Biopsy appointment made. Appointment date, time and instruction sent via mail.

## 2020-10-15 NOTE — Telephone Encounter (Signed)
-----   Message from Cleon Gustin, MD sent at 10/15/2020 10:45 AM EDT ----- He needs to proceed with prostate biopsy ----- Message ----- From: Iris Pert, LPN Sent: [phone]   1:16 PM EDT To: Cleon Gustin, MD  Please review

## 2020-10-19 NOTE — Telephone Encounter (Signed)
Patient accepted biopsy date and time. Instructions went over with patient via phone and sent via mail.

## 2020-10-24 ENCOUNTER — Other Ambulatory Visit (INDEPENDENT_AMBULATORY_CARE_PROVIDER_SITE_OTHER): Payer: Self-pay | Admitting: Nurse Practitioner

## 2020-10-24 ENCOUNTER — Other Ambulatory Visit: Payer: Self-pay | Admitting: Urology

## 2020-10-24 DIAGNOSIS — N138 Other obstructive and reflux uropathy: Secondary | ICD-10-CM

## 2020-10-24 DIAGNOSIS — N401 Enlarged prostate with lower urinary tract symptoms: Secondary | ICD-10-CM

## 2020-10-29 NOTE — Telephone Encounter (Signed)
Patient called office to review prostate biopsy. Instructions reviewed with patient and patient voiced understanding.

## 2020-11-03 ENCOUNTER — Ambulatory Visit (INDEPENDENT_AMBULATORY_CARE_PROVIDER_SITE_OTHER): Payer: Medicare Other | Admitting: Internal Medicine

## 2020-11-24 ENCOUNTER — Encounter (HOSPITAL_COMMUNITY): Payer: Self-pay

## 2020-11-24 ENCOUNTER — Other Ambulatory Visit: Payer: Self-pay

## 2020-11-24 ENCOUNTER — Other Ambulatory Visit: Payer: Self-pay | Admitting: Urology

## 2020-11-24 ENCOUNTER — Ambulatory Visit (HOSPITAL_BASED_OUTPATIENT_CLINIC_OR_DEPARTMENT_OTHER): Payer: Medicare Other | Admitting: Urology

## 2020-11-24 ENCOUNTER — Ambulatory Visit (HOSPITAL_COMMUNITY)
Admission: RE | Admit: 2020-11-24 | Discharge: 2020-11-24 | Disposition: A | Discharge status: Home / Self Care | Payer: Medicare Other | Source: Ambulatory Visit | Attending: Urology | Admitting: Urology

## 2020-11-24 ENCOUNTER — Encounter: Payer: Self-pay | Admitting: Urology

## 2020-11-24 DIAGNOSIS — R972 Elevated prostate specific antigen [PSA]: Secondary | ICD-10-CM | POA: Insufficient documentation

## 2020-11-24 MED ORDER — LIDOCAINE HCL (PF) 2 % IJ SOLN
INTRAMUSCULAR | Status: AC
  Administered 2020-11-24: 10 mL
  Filled 2020-11-24: qty 10

## 2020-11-24 MED ORDER — GENTAMICIN SULFATE 40 MG/ML IJ SOLN
INTRAMUSCULAR | Status: AC
  Administered 2020-11-24: 80 mg via INTRAMUSCULAR
  Filled 2020-11-24: qty 2

## 2020-11-24 MED ORDER — LIDOCAINE HCL (PF) 2 % IJ SOLN: 10.0000 mL | Freq: Once | INTRAMUSCULAR | Status: AC

## 2020-11-24 MED ORDER — GENTAMICIN SULFATE 40 MG/ML IJ SOLN: 80.0000 mg | Freq: Once | INTRAMUSCULAR | Status: AC

## 2020-11-24 NOTE — Progress Notes (Signed)
Prostate Biopsy Procedure   Informed consent was obtained after discussing risks/benefits of the procedure.  A time out was performed to ensure correct patient identity.  Pre-Procedure: - Last PSA Level: No results found for: PSA - Gentamicin given prophylactically - Levaquin 500 mg administered PO -Transrectal Ultrasound performed revealing a 89.4 gm prostate -No significant hypoechoic or median lobe noted  Procedure: - Prostate block performed using 10 cc 1% lidocaine and biopsies taken from sextant areas, a total of 12 under ultrasound guidance.  Post-Procedure: - Patient tolerated the procedure well - He was counseled to seek immediate medical attention if experiences any severe pain, significant bleeding, or fevers - Return in one week to discuss biopsy results

## 2020-11-24 NOTE — Progress Notes (Signed)
PT tolerated prostate biopsy procedure and antibiotic injection well today. Labs obtained and sent for pathology. PT ambulatory at discharge with no acute distress noted and verbalized understanding of discharge instructions. PT to follow up with urologist as scheduled on 12/01/20 at 3:45pm.

## 2020-11-24 NOTE — Patient Instructions (Signed)
Transrectal Ultrasound-Guided Prostate Biopsy, Care After This sheet gives you information about how to care for yourself after your procedure. Your doctor may also give you more specific instructions. If youhave problems or questions, contact your doctor. What can I expect after the procedure? After the procedure, it is common to have: Pain and discomfort in your butt, especially while sitting. Pink-colored pee (urine), due to small amounts of blood in the pee. Burning while peeing (urinating). Blood in your poop (stool). Bleeding from your butt. Blood in your semen. Follow these instructions at home: Medicines Take over-the-counter and prescription medicines only as told by your doctor. If you were prescribed antibiotic medicine, take it as told by your doctor. Do not stop taking the antibiotic even if you start to feel better. Activity  Do not drive for 24 hours if you were given a medicine to help you relax (sedative) during your procedure. Return to your normal activities as told by your doctor. Ask your doctor what activities are safe for you. Ask your doctor when it is okay for you to have sex. Do not lift anything that is heavier than 10 lb (4.5 kg), or the limit that you are told, until your doctor says that it is safe.  General instructions  Drink enough water to keep your pee pale yellow. Watch your pee, poop, and semen for new bleeding or bleeding that gets worse. Keep all follow-up visits as told by your doctor. This is important.  Contact a doctor if you: Have blood clots in your pee or poop. Notice that your pee smells bad or unusual. Have very bad belly pain. Have trouble peeing. Notice that your lower belly feels firm. Have blood in your pee for more than 2 weeks after the procedure. Have blood in your semen for more than 2 months after the procedure. Have problems getting an erection. Feel sick to your stomach (nauseous) or throw up (vomit). Have new or worse  bleeding in your pee, poop, or semen. Get help right away if you: Have a fever or chills. Have bright red pee. Have very bad pain that does not get better with medicine. Cannot pee. Summary After this procedure, it is common to have pain and discomfort around your butt, especially while sitting. You may have blood in your pee and poop. It is common to have blood in your semen for 1-2 months. If you were prescribed antibiotic medicine, take it as told by your doctor. Do not stop taking the antibiotic even if you start to feel better. Get help right away if you have a fever or chills. This information is not intended to replace advice given to you by your health care provider. Make sure you discuss any questions you have with your healthcare provider. Document Revised: 12/22/2019 Document Reviewed: 10/23/2019 Elsevier Patient Education  2022 Elsevier Inc.  

## 2020-11-26 ENCOUNTER — Ambulatory Visit: Payer: Medicare Other | Admitting: Urology

## 2020-11-26 ENCOUNTER — Telehealth: Payer: Self-pay

## 2020-11-26 NOTE — Telephone Encounter (Signed)
Patient notified of negative biopsy result. Will f/u in 6 months with psa. Patient voiced understanding.

## 2020-11-28 NOTE — Progress Notes (Deleted)
Subjective:    Patient ID: Nathaniel Kline, male    DOB: 1950/10/19, 70 y.o.   MRN: [id number]  HPI: Nathaniel Kline is a 70 y.o. male presenting for new patient visit to establish care.  Introduced to Designer, jewellery role and practice setting.  All questions answered.  Discussed provider/patient relationship and expectations.  No chief complaint on file.  Elevated liver enzymes  CHRONIC KIDNEY DISEASE CKD status: {Blank single:19197::"controlled","uncontrolled","better","worse","exacerbated","stable"} Medications renally dose: {Blank single:19197::"yes","no"} Previous renal evaluation: {Blank single:19197::"yes","no"} Pneumovax:  {Blank single:19197::"Up to Date","Not up to Date","unknown"} Influenza Vaccine:  {Blank single:19197::"Up to Date","Not up to Date","unknown"}   HYPERTENSION / HYPERLIPIDEMIA BP monitoring frequency: {Blank single:19197::"not checking","rarely","daily","weekly","monthly","a few times a day","a few times a week","a few times a month"} BP range:  Aspirin: {Blank single:19197::"yes","no"} Recent stressors: {Blank single:19197::"yes","no"} Recurrent headaches: {Blank single:19197::"yes","no"} Visual changes: {Blank single:19197::"yes","no"} Palpitations: {Blank single:19197::"yes","no"} Dyspnea: {Blank single:19197::"yes","no"} Chest pain: {Blank single:19197::"yes","no"} Lower extremity edema: {Blank single:19197::"yes","no"} Dizzy/lightheaded: {Blank single:19197::"yes","no"} Myalgias: {Blank single:19197::"yes","no"} The 10-year ASCVD risk score (Arnett DK, et al., 2019) is: 25%   Values used to calculate the score:     Age: 46 years     Sex: Male     Is Non-Hispanic African American: Yes     Diabetic: No     Tobacco smoker: No     Systolic Blood Pressure: 818 mmHg     Is BP treated: Yes     HDL Cholesterol: 41 mg/dL     Total Cholesterol: 205 mg/dL    Allergies  Allergen Reactions   Other     Peanut butter, carrots, celery     Outpatient  Encounter Medications as of 11/29/2020  Medication Sig   alfuzosin (UROXATRAL) 10 MG 24 hr tablet TAKE 1 TABLET (10 MG TOTAL) BY MOUTH DAILY WITH BREAKFAST.   amLODipine (NORVASC) 10 MG tablet Take 1 tablet (10 mg total) by mouth daily. No more refills will be approved by this provider as office is now permanently closed, patient will need to find another primary care provider.   aspirin 81 MG EC tablet Take 81 mg by mouth daily.     Multiple Vitamin (MULTIVITAMIN) tablet Take 1 tablet by mouth daily.     tiZANidine (ZANAFLEX) 2 MG tablet TAKE 1 TABLET (2 MG TOTAL) BY MOUTH 2 (TWO) TIMES DAILY AS NEEDED FOR MUSCLE SPASMS.   vitamin C (ASCORBIC ACID) 500 MG tablet Take 500 mg by mouth daily.   No facility-administered encounter medications on file as of 11/29/2020.    Active Ambulatory Problems    Diagnosis Date Noted   HLD (hyperlipidemia) 11/26/2008   ALCOHOL ABUSE 11/26/2008   Essential hypertension 11/26/2008   CAD, NATIVE VESSEL 09/07/2009   Back pain 07/08/2013   Neck pain 03/13/2019   Right shoulder pain 04/24/2019   Vaccine counseling 04/24/2019   Resolved Ambulatory Problems    Diagnosis Date Noted   DIZZINESS 11/27/2008   Past Medical History:  Diagnosis Date   Alcohol abuse    HTN (hypertension)     Past Medical History:  Diagnosis Date   Alcohol abuse    HLD (hyperlipidemia)    HTN (hypertension)     Past Surgical History:  Procedure Laterality Date   2 caths  10/21/05   caridac cath    Social History   Tobacco Use   Smoking status: Former    Packs/day: 0.50    Years: 19.00    Pack years: 9.50    Types: Cigarettes    Start date: 08/21/1974  Quit date: 08/20/1993    Years since quitting: 27.2   Smokeless tobacco: Never   Tobacco comments:    tobacco use - no  Vaping Use   Vaping Use: Never used  Substance Use Topics   Alcohol use: Yes    Comment: 1/2 pint at weekends   Drug use: No    Family History  Problem Relation Age of Onset    Hypertension Other        family hx   Diabetes Mother    High blood pressure Mother    High blood pressure Sister    High blood pressure Brother    Cancer Brother    High blood pressure Sister     Review of Systems Per HPI unless specifically indicated above     Objective:    There were no vitals taken for this visit.  Wt Readings from Last 3 Encounters:  08/04/20 267 lb 6.4 oz (121.3 kg)  12/19/19 259 lb 12.8 oz (117.8 kg)  07/31/19 259 lb 12.8 oz (117.8 kg)    Physical Exam  Results for orders placed or performed in visit on 09/29/20  Microscopic Examination   Urine  Result Value Ref Range   WBC, UA 0-5 0 - 5 /hpf   RBC None seen 0 - 2 /hpf   Epithelial Cells (non renal) 0-10 0 - 10 /hpf   Renal Epithel, UA None seen None seen /hpf   Mucus, UA Present Not Estab.   Bacteria, UA None seen None seen/Few  Urinalysis, Routine w reflex microscopic  Result Value Ref Range   Specific Gravity, UA >1.030 (H) 1.005 - 1.030   pH, UA 5.5 5.0 - 7.5   Color, UA Amber (A) Yellow   Appearance Ur Clear Clear   Leukocytes,UA Trace (A) Negative   Protein,UA 1+ (A) Negative/Trace   Glucose, UA Negative Negative   Ketones, UA 1+ (A) Negative   RBC, UA Negative Negative   Bilirubin, UA Negative Negative   Urobilinogen, Ur 1.0 0.2 - 1.0 mg/dL   Nitrite, UA Negative Negative   Microscopic Examination See below:   PSA  Result Value Ref Range   Prostate Specific Ag, Serum 8.0 (H) 0.0 - 4.0 ng/mL      Assessment & Plan:   Problem List Items Addressed This Visit   None    Follow up plan: No follow-ups on file.

## 2020-11-29 ENCOUNTER — Ambulatory Visit: Payer: Medicare Other | Admitting: Nurse Practitioner

## 2020-12-01 ENCOUNTER — Ambulatory Visit: Payer: Medicare Other | Admitting: Urology

## 2020-12-09 ENCOUNTER — Ambulatory Visit: Payer: Medicare Other | Admitting: Nurse Practitioner

## 2020-12-09 NOTE — Progress Notes (Deleted)
Subjective:    Patient ID: Nathaniel Kline, male    DOB: 10-Apr-1950, 70 y.o.   MRN: [id number]  HPI: Nathaniel Kline is a 70 y.o. male presenting for new patient visit to establish care.  Introduced to Designer, jewellery role and practice setting.  All questions answered.  Discussed provider/patient relationship and expectations.  No chief complaint on file.  HYPERTENSION Hypertension status: {Blank single:19197::"controlled","uncontrolled","better","worse","exacerbated","stable"}  BP monitoring frequency:  {Blank single:19197::"not checking","rarely","daily","weekly","monthly","a few times a day","a few times a week","a few times a month"} BP range:  Medication compliance: Aspirin: {Blank single:19197::"yes","no"} Recurrent headaches: {Blank single:19197::"yes","no"} Visual changes: {Blank single:19197::"yes","no"} Palpitations: {Blank single:19197::"yes","no"} Dyspnea: {Blank single:19197::"yes","no"} Chest pain: {Blank single:19197::"yes","no"} Lower extremity edema: {Blank single:19197::"yes","no"} Dizzy/lightheaded: {Blank single:19197::"yes","no"}  BACK PAIN Duration: Mechanism of injury: Location: {Blank multiple:19196::"Right","Left","R>L","L>R","midline","bilateral","low back","upper back"} Onset: {Blank single:19197::"sudden","gradual"} Severity: Quality:  Frequency: Radiation: {Blank multiple:19196::"none","buttocks","R leg below the knee","R leg above the knee","L leg below the knee","L leg above the knee"} Aggravating factors: Alleviating factors: Status:  Treatments attempted:   Relief with NSAIDs?: {Blank single:19197::"No NSAIDs Taken","no","mild","moderate","significant"} Nighttime pain:  {Blank single:19197::"yes","no"} Paresthesias / decreased sensation:  {Blank single:19197::"yes","no"} Bowel / bladder incontinence:  {Blank single:19197::"yes","no"} Fevers:  {Blank single:19197::"yes","no"} Dysuria / urinary frequency:  {Blank  single:19197::"yes","no"}  HYPERLIPIDEMIA Hyperlipidemia status: {Blank single:19197::"excellent compliance","good compliance","fair compliance","poor compliance"} Satisfied with current treatment?  {Blank single:19197::"yes","no"} Side effects:  {Blank single:19197::"yes","no"} Medication compliance: {Blank single:19197::"excellent compliance","good compliance","fair compliance","poor compliance"} Past cholesterol meds:  Aspirin:  {Blank single:19197::"yes","no"} The 10-year ASCVD risk score (Arnett DK, et al., 2019) is: 25%   Values used to calculate the score:     Age: 6 years     Sex: Male     Is Non-Hispanic African American: Yes     Diabetic: No     Tobacco smoker: No     Systolic Blood Pressure: 947 mmHg     Is BP treated: Yes     HDL Cholesterol: 41 mg/dL     Total Cholesterol: 205 mg/dL Chest pain:  {Blank single:19197::"yes","no"} Coronary artery disease:  {Blank single:19197::"yes","no"} Family history CAD:  {Blank single:19197::"yes","no"} Family history early CAD:  {Blank single:19197::"yes","no"}    Allergies  Allergen Reactions   Other     Peanut butter, carrots, celery     Outpatient Encounter Medications as of 12/09/2020  Medication Sig   alfuzosin (UROXATRAL) 10 MG 24 hr tablet TAKE 1 TABLET (10 MG TOTAL) BY MOUTH DAILY WITH BREAKFAST.   amLODipine (NORVASC) 10 MG tablet Take 1 tablet (10 mg total) by mouth daily. No more refills will be approved by this provider as office is now permanently closed, patient will need to find another primary care provider.   aspirin 81 MG EC tablet Take 81 mg by mouth daily.     Multiple Vitamin (MULTIVITAMIN) tablet Take 1 tablet by mouth daily.     tiZANidine (ZANAFLEX) 2 MG tablet TAKE 1 TABLET (2 MG TOTAL) BY MOUTH 2 (TWO) TIMES DAILY AS NEEDED FOR MUSCLE SPASMS.   vitamin C (ASCORBIC ACID) 500 MG tablet Take 500 mg by mouth daily.   No facility-administered encounter medications on file as of 12/09/2020.    Active  Ambulatory Problems    Diagnosis Date Noted   HLD (hyperlipidemia) 11/26/2008   ALCOHOL ABUSE 11/26/2008   Essential hypertension 11/26/2008   CAD, NATIVE VESSEL 09/07/2009   Back pain 07/08/2013   Neck pain 03/13/2019   Right shoulder pain 04/24/2019   Vaccine counseling 04/24/2019   Resolved Ambulatory Problems    Diagnosis Date Noted  DIZZINESS 11/27/2008   Past Medical History:  Diagnosis Date   Alcohol abuse    HTN (hypertension)     Past Medical History:  Diagnosis Date   Alcohol abuse    HLD (hyperlipidemia)    HTN (hypertension)     Past Surgical History:  Procedure Laterality Date   2 caths  10/21/05   caridac cath    Social History   Tobacco Use   Smoking status: Former    Packs/day: 0.50    Years: 19.00    Pack years: 9.50    Types: Cigarettes    Start date: 08/21/1974    Quit date: 08/20/1993    Years since quitting: 27.3   Smokeless tobacco: Never   Tobacco comments:    tobacco use - no  Vaping Use   Vaping Use: Never used  Substance Use Topics   Alcohol use: Yes    Comment: 1/2 pint at weekends   Drug use: No    Family History  Problem Relation Age of Onset   Hypertension Other        family hx   Diabetes Mother    High blood pressure Mother    High blood pressure Sister    High blood pressure Brother    Cancer Brother    High blood pressure Sister     Review of Systems Per HPI unless specifically indicated above     Objective:    There were no vitals taken for this visit.  Wt Readings from Last 3 Encounters:  08/04/20 267 lb 6.4 oz (121.3 kg)  12/19/19 259 lb 12.8 oz (117.8 kg)  07/31/19 259 lb 12.8 oz (117.8 kg)    Physical Exam  Results for orders placed or performed in visit on 09/29/20  Microscopic Examination   Urine  Result Value Ref Range   WBC, UA 0-5 0 - 5 /hpf   RBC None seen 0 - 2 /hpf   Epithelial Cells (non renal) 0-10 0 - 10 /hpf   Renal Epithel, UA None seen None seen /hpf   Mucus, UA Present Not Estab.    Bacteria, UA None seen None seen/Few  Urinalysis, Routine w reflex microscopic  Result Value Ref Range   Specific Gravity, UA >1.030 (H) 1.005 - 1.030   pH, UA 5.5 5.0 - 7.5   Color, UA Amber (A) Yellow   Appearance Ur Clear Clear   Leukocytes,UA Trace (A) Negative   Protein,UA 1+ (A) Negative/Trace   Glucose, UA Negative Negative   Ketones, UA 1+ (A) Negative   RBC, UA Negative Negative   Bilirubin, UA Negative Negative   Urobilinogen, Ur 1.0 0.2 - 1.0 mg/dL   Nitrite, UA Negative Negative   Microscopic Examination See below:   PSA  Result Value Ref Range   Prostate Specific Ag, Serum 8.0 (H) 0.0 - 4.0 ng/mL      Assessment & Plan:   Problem List Items Addressed This Visit   None    Follow up plan: No follow-ups on file.

## 2021-01-18 DIAGNOSIS — J069 Acute upper respiratory infection, unspecified: Secondary | ICD-10-CM | POA: Diagnosis not present

## 2021-01-18 DIAGNOSIS — I10 Essential (primary) hypertension: Secondary | ICD-10-CM | POA: Diagnosis not present

## 2021-01-18 DIAGNOSIS — Z0189 Encounter for other specified special examinations: Secondary | ICD-10-CM | POA: Diagnosis not present

## 2021-01-18 DIAGNOSIS — R059 Cough, unspecified: Secondary | ICD-10-CM | POA: Diagnosis not present

## 2021-01-18 DIAGNOSIS — R0981 Nasal congestion: Secondary | ICD-10-CM | POA: Diagnosis not present

## 2021-02-03 DIAGNOSIS — E785 Hyperlipidemia, unspecified: Secondary | ICD-10-CM | POA: Diagnosis not present

## 2021-02-03 DIAGNOSIS — Z0001 Encounter for general adult medical examination with abnormal findings: Secondary | ICD-10-CM | POA: Diagnosis not present

## 2021-02-03 DIAGNOSIS — R7401 Elevation of levels of liver transaminase levels: Secondary | ICD-10-CM | POA: Diagnosis not present

## 2021-02-03 DIAGNOSIS — Z1389 Encounter for screening for other disorder: Secondary | ICD-10-CM | POA: Diagnosis not present

## 2021-02-03 DIAGNOSIS — R809 Proteinuria, unspecified: Secondary | ICD-10-CM | POA: Diagnosis not present

## 2021-02-03 DIAGNOSIS — I10 Essential (primary) hypertension: Secondary | ICD-10-CM | POA: Diagnosis not present

## 2021-02-08 ENCOUNTER — Other Ambulatory Visit (HOSPITAL_COMMUNITY): Payer: Self-pay | Admitting: Family Medicine

## 2021-02-08 ENCOUNTER — Other Ambulatory Visit: Payer: Self-pay | Admitting: Family Medicine

## 2021-02-08 DIAGNOSIS — E785 Hyperlipidemia, unspecified: Secondary | ICD-10-CM | POA: Diagnosis not present

## 2021-02-08 DIAGNOSIS — R7401 Elevation of levels of liver transaminase levels: Secondary | ICD-10-CM | POA: Diagnosis not present

## 2021-02-08 DIAGNOSIS — R7981 Abnormal blood-gas level: Secondary | ICD-10-CM | POA: Diagnosis not present

## 2021-02-08 DIAGNOSIS — I10 Essential (primary) hypertension: Secondary | ICD-10-CM | POA: Diagnosis not present

## 2021-02-08 DIAGNOSIS — R809 Proteinuria, unspecified: Secondary | ICD-10-CM | POA: Diagnosis not present

## 2021-04-02 ENCOUNTER — Other Ambulatory Visit (INDEPENDENT_AMBULATORY_CARE_PROVIDER_SITE_OTHER): Payer: Self-pay | Admitting: Nurse Practitioner

## 2021-04-05 ENCOUNTER — Ambulatory Visit: Payer: Medicare Other | Admitting: Internal Medicine

## 2021-04-08 ENCOUNTER — Telehealth: Payer: Self-pay | Admitting: Cardiology

## 2021-04-08 NOTE — Telephone Encounter (Signed)
STAT if patient feels like he/she is going to faint   Are you dizzy now? no  Do you feel faint or have you passed out? no  Do you have any other symptoms? no  Have you checked your HR and BP (record if available)? No  Patient said he gets dizzy when he stands up too quickly. He just wants to get checked out by A Cardiologist.  He was a patient of Dr. Harl Bowie in the past, but is now considered a new patient and is scheduled to see Dr. Johney Frame until 06/02/21

## 2021-04-08 NOTE — Telephone Encounter (Signed)
Pt states that he feels dizzy at times with standing. Informed pt that if sx continue we can bring him in for a nurse visit to check orthostatic bp. Pt agrees with plan and states that he will call office back.

## 2021-05-29 NOTE — Progress Notes (Deleted)
?Cardiology Office Note:   ? ?Date:  05/29/2021  ? ?ID:  Nathaniel Kline, DOB 31-Aug-1950, MRN [id number] ? ?PCP:  Eulogio Bear, NP ?  ?[facility] Providers ?Cardiologist:  Carlyle Dolly, MD { ? ? ?Referring MD: Eulogio Bear, NP  ? ? ?History of Present Illness:   ? ?Nathaniel Kline is a 71 y.o. male with a hx of nonobstructive CAD, HTN, alcohol abuse, and HLD who was followed by Dr. Harl Bowie but was lost to follow-up who now returns to clinic to re-establish care. ? ?Per review of the record, the patient has a history of chest pain with cath 2007 with moderate nonobstructive CAD. TTE 09/2010 with LVEF 55-60%. Last saw Dr. Harl Bowie in 2019 with atypical chest pain made worse by spicy foods thought to be due to GERD.  ? ?Past Medical History:  ?Diagnosis Date  ? Alcohol abuse   ? HLD (hyperlipidemia)   ? HTN (hypertension)   ? ? ?Past Surgical History:  ?Procedure Laterality Date  ? 2 caths  10/21/05  ? caridac cath  ? ? ?Current Medications: ?No outpatient medications have been marked as taking for the 06/02/21 encounter (Appointment) with Freada Bergeron, MD.  ?  ? ?Allergies:   Other  ? ?Social History  ? ?Socioeconomic History  ? Marital status: Single  ?  Spouse name: Not on file  ? Number of children: 2  ? Years of education: Not on file  ? Highest education level: Not on file  ?Occupational History  ? Occupation: retired  ?Tobacco Use  ? Smoking status: Former  ?  Packs/day: 0.50  ?  Years: 19.00  ?  Pack years: 9.50  ?  Types: Cigarettes  ?  Start date: 08/21/1974  ?  Quit date: 08/20/1993  ?  Years since quitting: 27.7  ? Smokeless tobacco: Never  ? Tobacco comments:  ?  tobacco use - no  ?Vaping Use  ? Vaping Use: Never used  ?Substance and Sexual Activity  ? Alcohol use: Yes  ?  Comment: 1/2 pint at weekends  ? Drug use: No  ? Sexual activity: Yes  ?  Partners: Female  ?Other Topics Concern  ? Not on file  ?Social History Narrative  ? Single.Disabled, does not get regular exercise.   ? ?Social  Determinants of Health  ? ?Financial Resource Strain: Not on file  ?Food Insecurity: Not on file  ?Transportation Needs: Not on file  ?Physical Activity: Not on file  ?Stress: Not on file  ?Social Connections: Not on file  ?  ? ?Family History: ?The patient's ***family history includes Cancer in his brother; Diabetes in his mother; High blood pressure in his brother, mother, sister, and sister; Hypertension in an other family member. ? ?ROS:   ?Please see the history of present illness.    ?*** All other systems reviewed and are negative. ? ?EKGs/Labs/Other Studies Reviewed:   ? ?The following studies were reviewed today: ?10/2005 Cath ?Central aortic pressure 136/96 with a mean of 115.  LV pressure is 129/80 ? with LVEDP of 11.  There was no aortic stenosis. ?  ? Left main was normal. ?  ? LAD was a long vessel wrapping the apex.  It gave off a large branching ? first diagonal and a small second diagonal.  There was a 40% lesion in the ? mid-LAD spanning the takeoff of the first diagonal.  This was mildly hazy in ? one view.  There was also 40% lesion in  the proximal portion of the ? diagonal. ?  ? Left circumflex was a moderate-size vessel made up of a large OM1 and a ? small OM2.  It was angiographically normal. ?  ? The right coronary artery was a very large dominant vessel.  It gave off a ? RV branch, large branching PDA and two normal-size posterolaterals. ?  ? Left ventriculogram done in the RAO position showed an EF of 55% with distal ? anterior wall dyssynchrony.  There was no mitral regurgitation.  Left ? ventriculogram done in the LAO position showed an EF of 55-60% with normal ? septal and lateral wall motion. ?  ? ASSESSMENT: ? 1. Moderate nonobstructive coronary artery disease with a questionable ?     hazy lesion in the mid-left anterior descending artery. ? 2. Normal left ventricular function with distal anterior wall dyssynchrony ?     on the RAO left ventriculogram.  The LAO ventriculogram was  normal. ? 3. Abnormal EKG with stuttering chest pain suggestive of possible acute ?     coronary syndrome. ?  ? PLAN/DISCUSSION:  The lesion in his LAD does not look obstructive but given ? his symptoms and abnormal ECG, I do think it is reasonable to proceed with ? IVUS of the LAD to evaluate for thrombus or underlying significant filling ? defect.  This will be done by Dr. Burt Knack.  He will need aggressive risk ? factor modification as well as control of his blood pressure. ?  ?FINDINGS:  The distal LAD where we began pullback was normal.  There was ? mild eccentric plaque distal to the area of interest.  As we approached the ? area of interest there was concentric plaquing with very mild calcification ? present; there was actually a large atheroma burden.  However, the luminal ? area remained nonobstructed throughout.  The lumen at the tightest area of ? stenosis had an area of 6.4 square millimeters.  The proximal LAD had very ? mild concentric plaquing and no other disease was noted. ?  ? ASSESSMENT:  Moderate atherosclerotic concentric plaquing involving the mid ? left anterior descending.  There was no thrombus visualized. ?  ? PLAN:  Medical therapy with aggressive risk factor modification. ? ?EKG:  EKG is *** ordered today.  The ekg ordered today demonstrates *** ? ?Recent Labs: ?08/04/2020: ALT 79; BUN 25; Creat 1.47; Hemoglobin 15.2; Platelets 229; Potassium 4.3; Sodium 136; TSH 6.79  ?Recent Lipid Panel ?   ?Component Value Date/Time  ? CHOL 205 (H) 08/04/2020 0000  ? TRIG 158 (H) 08/04/2020 0000  ? HDL 41 08/04/2020 0000  ? CHOLHDL 5.0 (H) 08/04/2020 0000  ? VLDL 25 10/26/2012 0913  ? [place] 135 (H) 08/04/2020 0000  ? ? ? ?Risk Assessment/Calculations:   ?{Does this patient have ATRIAL FIBRILLATION?:[phone]} ? ?    ? ?Physical Exam:   ? ?VS:  There were no vitals taken for this visit.   ? ?Wt Readings from Last 3 Encounters:  ?08/04/20 267 lb 6.4 oz (121.3 kg)  ?12/19/19 259 lb 12.8 oz (117.8 kg)   ?07/31/19 259 lb 12.8 oz (117.8 kg)  ?  ? ?GEN: *** Well nourished, well developed in no acute distress ?HEENT: Normal ?NECK: No JVD; No carotid bruits ?LYMPHATICS: No lymphadenopathy ?CARDIAC: ***RRR, no murmurs, rubs, gallops ?RESPIRATORY:  Clear to auscultation without rales, wheezing or rhonchi  ?ABDOMEN: Soft, non-tender, non-distended ?MUSCULOSKELETAL:  No edema; No deformity  ?SKIN: Warm and dry ?NEUROLOGIC:  Alert and oriented x 3 ?  PSYCHIATRIC:  Normal affect  ? ?ASSESSMENT:   ? ?No diagnosis found. ?PLAN:   ? ?In order of problems listed above: ? ?#Nonobstructive CAD: ?Cath in 2007 with moderate disease.  ? ?#HTN: ?-Continue amlodipine '10mg'$  daily ? ?   ? ?{Are you ordering a CV Procedure (e.g. stress test, cath, DCCV, TEE, etc)?   Press F2        :[id number]}  ? ? ?Medication Adjustments/Labs and Tests Ordered: ?Current medicines are reviewed at length with the patient today.  Concerns regarding medicines are outlined above.  ?No orders of the defined types were placed in this encounter. ? ?No orders of the defined types were placed in this encounter. ? ? ?There are no Patient Instructions on file for this visit.  ? ?Signed, ?Freada Bergeron, MD  ?05/29/2021 3:41 PM    ?[place] ?

## 2021-06-01 ENCOUNTER — Other Ambulatory Visit: Payer: Self-pay

## 2021-06-01 ENCOUNTER — Other Ambulatory Visit: Payer: Medicare Other

## 2021-06-01 DIAGNOSIS — R972 Elevated prostate specific antigen [PSA]: Secondary | ICD-10-CM | POA: Diagnosis not present

## 2021-06-02 ENCOUNTER — Ambulatory Visit: Payer: Medicare Other | Admitting: Cardiology

## 2021-06-02 LAB — PSA: Prostate Specific Ag, Serum: 5.4 ng/mL — ABNORMAL HIGH (ref 0.0–4.0)

## 2021-06-03 NOTE — Progress Notes (Incomplete)
CARDIOLOGY CONSULT NOTE  ? ? ? ? ? ?Patient ID: ?Nathaniel Kline ?MRN: [id number] ?DOB/AGE: 1950/09/12 71 y.o. ? ?Admit date: (Not on file) ?Referring Physician: Edsel Petrin ?Primary Physician: Eulogio Bear, NP ?Primary Cardiologist: Branch ?Reason for Consultation: CAD ? ?Active Problems: ?  * No active hospital problems. * ? ? ?HPI:  71 y.o. referred by NP Edsel Petrin f/u f/u CAD. Patient last seen by Dr Harl Bowie 2019 History of nonobstructive CAD cath 2007 40% mid LAD with non obstructive lumen by IVUS  EF normal by TTE in 2012  CRFls HTN and HLD. History of ETOH abuse Recently followed by Dr Alyson Ingles for prostate issues with biopsy PSA 5.4 06/01/21  ? ?Called office 04/08/21 felt dizzy when standing Complained about this to primary 07/04/20 was in setting of ETOH use ? Thought to be from alcohol and hot weather  ? ?*** ? ?ROS ?All other systems reviewed and negative except as noted above ? ?Past Medical History:  ?Diagnosis Date  ?? Alcohol abuse   ?? HLD (hyperlipidemia)   ?? HTN (hypertension)   ?  ?Family History  ?Problem Relation Age of Onset  ?? Hypertension Other   ?     family hx  ?? Diabetes Mother   ?? High blood pressure Mother   ?? High blood pressure Sister   ?? High blood pressure Brother   ?? Cancer Brother   ?? High blood pressure Sister   ?  ?Social History  ? ?Socioeconomic History  ?? Marital status: Single  ?  Spouse name: Not on file  ?? Number of children: 2  ?? Years of education: Not on file  ?? Highest education level: Not on file  ?Occupational History  ?? Occupation: retired  ?Tobacco Use  ?? Smoking status: Former  ?  Packs/day: 0.50  ?  Years: 19.00  ?  Pack years: 9.50  ?  Types: Cigarettes  ?  Start date: 08/21/1974  ?  Quit date: 08/20/1993  ?  Years since quitting: 27.8  ?? Smokeless tobacco: Never  ?? Tobacco comments:  ?  tobacco use - no  ?Vaping Use  ?? Vaping Use: Never used  ?Substance and Sexual Activity  ?? Alcohol use: Yes  ?  Comment: 1/2 pint at weekends  ?? Drug use: No  ?? Sexual  activity: Yes  ?  Partners: Female  ?Other Topics Concern  ?? Not on file  ?Social History Narrative  ? Single.Disabled, does not get regular exercise.   ? ?Social Determinants of Health  ? ?Financial Resource Strain: Not on file  ?Food Insecurity: Not on file  ?Transportation Needs: Not on file  ?Physical Activity: Not on file  ?Stress: Not on file  ?Social Connections: Not on file  ?Intimate Partner Violence: Not on file  ?  ?Past Surgical History:  ?Procedure Laterality Date  ?? 2 caths  10/21/05  ? caridac cath  ?  ? ? ?Current Outpatient Medications:  ??  alfuzosin (UROXATRAL) 10 MG 24 hr tablet, TAKE 1 TABLET (10 MG TOTAL) BY MOUTH DAILY WITH BREAKFAST., Disp: 90 tablet, Rfl: 1 ??  amLODipine (NORVASC) 10 MG tablet, Take 1 tablet (10 mg total) by mouth daily. No more refills will be approved by this provider as office is now permanently closed, patient will need to find another primary care provider., Disp: 90 tablet, Rfl: 0 ??  aspirin 81 MG EC tablet, Take 81 mg by mouth daily.  , Disp: , Rfl:  ??  Multiple Vitamin (MULTIVITAMIN) tablet,  Take 1 tablet by mouth daily.  , Disp: , Rfl:  ??  tiZANidine (ZANAFLEX) 2 MG tablet, TAKE 1 TABLET (2 MG TOTAL) BY MOUTH 2 (TWO) TIMES DAILY AS NEEDED FOR MUSCLE SPASMS., Disp: 60 tablet, Rfl: 1 ??  vitamin C (ASCORBIC ACID) 500 MG tablet, Take 500 mg by mouth daily., Disp: , Rfl:  ? ? ? ?Physical Exam: ?There were no vitals taken for this visit.   ? ?Affect appropriate ?Healthy:  appears stated age ?HEENT: normal ?Neck supple with no adenopathy ?JVP normal no bruits no thyromegaly ?Lungs clear with no wheezing and good diaphragmatic motion ?Heart:  S1/S2 no murmur, no rub, gallop or click ?PMI normal ?Abdomen: benighn, BS positve, no tenderness, no AAA ?no bruit.  No HSM or HJR ?Distal pulses intact with no bruits ?No edema ?Neuro non-focal ?Skin warm and dry ?No muscular weakness ? ? ?Labs: ?  ?Lab Results  ?Component Value Date  ? WBC 8.0 08/04/2020  ? HGB 15.2 08/04/2020   ? HCT 45.0 08/04/2020  ? MCV 100.2 (H) 08/04/2020  ? PLT 229 08/04/2020  ? No results for input(s): NA, K, CL, CO2, BUN, CREATININE, CALCIUM, PROT, BILITOT, ALKPHOS, ALT, AST, GLUCOSE in the last 168 hours. ? ?Invalid input(s): LABALBU ?Lab Results  ?Component Value Date  ? TROPONINI <0.30 03/03/2013  ?  ?Lab Results  ?Component Value Date  ? CHOL 205 (H) 08/04/2020  ? CHOL 194 10/26/2012  ? CHOL 198 06/17/2012  ? ?Lab Results  ?Component Value Date  ? HDL 41 08/04/2020  ? HDL 46 10/26/2012  ? HDL 42 06/17/2012  ? ?Lab Results  ?Component Value Date  ? LDLCALC 135 (H) 08/04/2020  ? LDLCALC 123 (H) 10/26/2012  ? LDLCALC 125 (H) 06/17/2012  ? ?Lab Results  ?Component Value Date  ? TRIG 158 (H) 08/04/2020  ? TRIG 124 10/26/2012  ? TRIG 156 (H) 06/17/2012  ? ?Lab Results  ?Component Value Date  ? CHOLHDL 5.0 (H) 08/04/2020  ? CHOLHDL 4.2 10/26/2012  ? CHOLHDL 4.7 06/17/2012  ? ?No results found for: LDLDIRECT  ?  ?Radiology: ?No results found. ? ?EKG: *** ? ? ?ASSESSMENT AND PLAN:  ? ?CAD:  moderate by cath 40% LAD in 2007 *** ?HTN:  Well controlled.  Continue current medications and low sodium Dash type diet.   ?HLD:  Meds do not indicate statin use LDL 135 07/04/20 *** ?Prostate:  elevated PSA post biopsy fu [facility]  ? ?Signed: ?Jenkins Rouge ?06/03/2021, 3:49 PM ? ? ?

## 2021-06-06 ENCOUNTER — Ambulatory Visit: Payer: Medicare Other | Admitting: Cardiovascular Disease

## 2021-06-08 ENCOUNTER — Ambulatory Visit: Payer: Medicare Other | Admitting: Urology

## 2021-06-13 ENCOUNTER — Telehealth: Payer: Self-pay

## 2021-06-13 NOTE — Telephone Encounter (Signed)
Patient called and left vm 06/10/21 advising he was returning a call regarding test results. He advised he could be reached at [phone]. ? ? ? ?Thank you. ?

## 2021-06-13 NOTE — Telephone Encounter (Signed)
Returned call with no answer. Message left to return call to office. 

## 2021-06-20 NOTE — Telephone Encounter (Signed)
Patient left a voice mail message, returning Hope's call regarding test results. ?

## 2021-06-20 NOTE — Telephone Encounter (Signed)
Patient canceled 4/19 f/u.  Patient requesting PSA results.  Please advise.  ?

## 2021-06-29 ENCOUNTER — Ambulatory Visit (INDEPENDENT_AMBULATORY_CARE_PROVIDER_SITE_OTHER): Payer: Medicare PPO | Admitting: Urology

## 2021-06-29 ENCOUNTER — Encounter: Payer: Self-pay | Admitting: Urology

## 2021-06-29 VITALS — BP 146/80 | HR 85

## 2021-06-29 DIAGNOSIS — N138 Other obstructive and reflux uropathy: Secondary | ICD-10-CM | POA: Diagnosis not present

## 2021-06-29 DIAGNOSIS — R972 Elevated prostate specific antigen [PSA]: Secondary | ICD-10-CM

## 2021-06-29 DIAGNOSIS — N401 Enlarged prostate with lower urinary tract symptoms: Secondary | ICD-10-CM

## 2021-06-29 DIAGNOSIS — R351 Nocturia: Secondary | ICD-10-CM

## 2021-06-29 LAB — URINALYSIS, ROUTINE W REFLEX MICROSCOPIC
Bilirubin, UA: NEGATIVE
Glucose, UA: NEGATIVE
Leukocytes,UA: NEGATIVE
Nitrite, UA: NEGATIVE
RBC, UA: NEGATIVE
Specific Gravity, UA: 1.025 (ref 1.005–1.030)
Urobilinogen, Ur: 0.2 mg/dL (ref 0.2–1.0)
pH, UA: 5.5 (ref 5.0–7.5)

## 2021-06-29 LAB — MICROSCOPIC EXAMINATION: RBC, Urine: NONE SEEN /hpf (ref 0–2)

## 2021-06-29 LAB — BLADDER SCAN AMB NON-IMAGING: Scan Result: 0

## 2021-06-29 MED ORDER — ALFUZOSIN HCL ER 10 MG PO TB24: 10.0000 mg | ORAL_TABLET | Freq: Every day | ORAL | 3 refills | Status: DC

## 2021-06-29 NOTE — Progress Notes (Signed)
post void residual=0 ?

## 2021-06-29 NOTE — Patient Instructions (Signed)

## 2021-06-29 NOTE — Progress Notes (Signed)
06/29/2021 2:08 PM   Nathaniel Kline 10-28-1950 [id number]  Referring provider: Eulogio Bear, NP No address on file  Followup elevated PSA and nocturia   HPI: Nathaniel Kline is a 70yo here for followup for elevated PSA and nocturia. IPSS 15 QOL 3 on uroxatral. Urine stream strong. No straining to urinate. He drinks 40oz of water within 2 hours of going to bed. PSA decreased to 5.4    PMH: Past Medical History:  Diagnosis Date   Alcohol abuse    HLD (hyperlipidemia)    HTN (hypertension)     Surgical History: Past Surgical History:  Procedure Laterality Date   2 caths  10/21/05   caridac cath    Home Medications:  Allergies as of 06/29/2021       Reactions   Other    Peanut butter, carrots, celery        Medication List        Accurate as of Jun 29, 2021  2:08 PM. If you have any questions, ask your nurse or doctor.          alfuzosin 10 MG 24 hr tablet Commonly known as: UROXATRAL TAKE 1 TABLET (10 MG TOTAL) BY MOUTH DAILY WITH BREAKFAST.   amLODipine 10 MG tablet Commonly known as: NORVASC Take 1 tablet (10 mg total) by mouth daily. No more refills will be approved by this provider as office is now permanently closed, patient will need to find another primary care provider.   aspirin 81 MG EC tablet Take 81 mg by mouth daily.   multivitamin tablet Take 1 tablet by mouth daily.   tiZANidine 2 MG tablet Commonly known as: ZANAFLEX TAKE 1 TABLET (2 MG TOTAL) BY MOUTH 2 (TWO) TIMES DAILY AS NEEDED FOR MUSCLE SPASMS.   vitamin C 500 MG tablet Commonly known as: ASCORBIC ACID Take 500 mg by mouth daily.        Allergies:  Allergies  Allergen Reactions   Other     Peanut butter, carrots, celery     Family History: Family History  Problem Relation Age of Onset   Hypertension Other        family hx   Diabetes Mother    High blood pressure Mother    High blood pressure Sister    High blood pressure Brother    Cancer Brother    High  blood pressure Sister     Social History:  reports that he quit smoking about 27 years ago. His smoking use included cigarettes. He started smoking about 46 years ago. He has a 9.50 pack-year smoking history. He has never used smokeless tobacco. He reports current alcohol use. He reports that he does not use drugs.  ROS: All other review of systems were reviewed and are negative except what is noted above in HPI  Physical Exam: BP (!) 146/80   Pulse 85   Constitutional:  Alert and oriented, No acute distress. HEENT: [place] AT, moist mucus membranes.  Trachea midline, no masses. Cardiovascular: No clubbing, cyanosis, or edema. Respiratory: Normal respiratory effort, no increased work of breathing. GI: Abdomen is soft, nontender, nondistended, no abdominal masses GU: No CVA tenderness.  Lymph: No cervical or inguinal lymphadenopathy. Skin: No rashes, bruises or suspicious lesions. Neurologic: Grossly intact, no focal deficits, moving all 4 extremities. Psychiatric: Normal mood and affect.  Laboratory Data: Lab Results  Component Value Date   WBC 8.0 08/04/2020   HGB 15.2 08/04/2020   HCT 45.0 08/04/2020   MCV  100.2 (H) 08/04/2020   PLT 229 08/04/2020    Lab Results  Component Value Date   CREATININE 1.47 (H) 08/04/2020    No results found for: PSA  No results found for: TESTOSTERONE  Lab Results  Component Value Date   HGBA1C 90 12/30/2016    Urinalysis    Component Value Date/Time   COLORURINE YELLOW 01/19/2008 2204   APPEARANCEUR Clear 09/29/2020 1457   LABSPEC <1.005 (L) 01/19/2008 2204   PHURINE 5.5 01/19/2008 2204   GLUCOSEU Negative 09/29/2020 1457   HGBUR NEGATIVE 01/19/2008 2204   BILIRUBINUR Negative 09/29/2020 [place] 01/19/2008 2204   PROTEINUR 1+ (A) 09/29/2020 1457   PROTEINUR NEGATIVE 01/19/2008 2204   UROBILINOGEN 0.2 01/19/2008 2204   NITRITE Negative 09/29/2020 1457   NITRITE NEGATIVE 01/19/2008 2204   LEUKOCYTESUR Trace (A)  09/29/2020 1457    Lab Results  Component Value Date   LABMICR See below: 09/29/2020   WBCUA 0-5 09/29/2020   LABEPIT 0-10 09/29/2020   MUCUS Present 09/29/2020   BACTERIA None seen 09/29/2020    Pertinent Imaging:  No results found for this or any previous visit.  No results found for this or any previous visit.  No results found for this or any previous visit.  No results found for this or any previous visit.  No results found for this or any previous visit.  No results found for this or any previous visit.  No results found for this or any previous visit.  No results found for this or any previous visit.   Assessment & Plan:    1. Elevated PSA -RTC 6 months with PSA - Urinalysis, Routine w reflex microscopic - BLADDER SCAN AMB NON-IMAGING  2. BPh with LUTS -COntinue uroxatral '10mg'$  qhs  3. Nocturia -decrease fluid consumption within 2 hours of going to bed   No follow-ups on file.  Nicolette Bang, MD  [facility] [place]

## 2021-06-30 DIAGNOSIS — D1730 Benign lipomatous neoplasm of skin and subcutaneous tissue of unspecified sites: Secondary | ICD-10-CM | POA: Diagnosis not present

## 2021-07-08 ENCOUNTER — Other Ambulatory Visit: Payer: Self-pay

## 2021-07-08 ENCOUNTER — Telehealth: Payer: Self-pay

## 2021-07-08 MED ORDER — TAMSULOSIN HCL 0.4 MG PO CAPS: 0.4000 mg | ORAL_CAPSULE | Freq: Every day | ORAL | 11 refills | Status: DC

## 2021-07-08 NOTE — Telephone Encounter (Signed)
Verbal orders from Dr. Alyson Ingles to send in tamsulosin, rx sent to pharmacy and patient aware.

## 2021-07-08 NOTE — Telephone Encounter (Signed)
Rx alfuzosin makes the patient dizzy and is asking if you can rx something else?  Please advise.

## 2021-07-11 ENCOUNTER — Other Ambulatory Visit: Payer: Self-pay | Admitting: *Deleted

## 2021-07-11 DIAGNOSIS — D179 Benign lipomatous neoplasm, unspecified: Secondary | ICD-10-CM

## 2021-07-19 ENCOUNTER — Ambulatory Visit: Payer: Medicare PPO | Admitting: General Surgery

## 2021-07-28 ENCOUNTER — Ambulatory Visit: Payer: Medicare PPO | Admitting: General Surgery

## 2021-08-03 DIAGNOSIS — E039 Hypothyroidism, unspecified: Secondary | ICD-10-CM | POA: Diagnosis not present

## 2021-08-03 DIAGNOSIS — Z0001 Encounter for general adult medical examination with abnormal findings: Secondary | ICD-10-CM | POA: Diagnosis not present

## 2021-08-03 DIAGNOSIS — R7301 Impaired fasting glucose: Secondary | ICD-10-CM | POA: Diagnosis not present

## 2021-08-03 DIAGNOSIS — E782 Mixed hyperlipidemia: Secondary | ICD-10-CM | POA: Diagnosis not present

## 2021-08-19 ENCOUNTER — Ambulatory Visit: Payer: Medicare Other | Admitting: Internal Medicine

## 2021-09-01 DIAGNOSIS — E785 Hyperlipidemia, unspecified: Secondary | ICD-10-CM | POA: Diagnosis not present

## 2021-09-01 DIAGNOSIS — R809 Proteinuria, unspecified: Secondary | ICD-10-CM | POA: Diagnosis not present

## 2021-09-01 DIAGNOSIS — R7981 Abnormal blood-gas level: Secondary | ICD-10-CM | POA: Diagnosis not present

## 2021-09-01 DIAGNOSIS — E039 Hypothyroidism, unspecified: Secondary | ICD-10-CM | POA: Diagnosis not present

## 2021-09-01 DIAGNOSIS — F1010 Alcohol abuse, uncomplicated: Secondary | ICD-10-CM | POA: Diagnosis not present

## 2021-09-01 DIAGNOSIS — N289 Disorder of kidney and ureter, unspecified: Secondary | ICD-10-CM | POA: Diagnosis not present

## 2021-09-01 DIAGNOSIS — I10 Essential (primary) hypertension: Secondary | ICD-10-CM | POA: Diagnosis not present

## 2021-09-01 DIAGNOSIS — Z125 Encounter for screening for malignant neoplasm of prostate: Secondary | ICD-10-CM | POA: Diagnosis not present

## 2021-09-01 DIAGNOSIS — R7401 Elevation of levels of liver transaminase levels: Secondary | ICD-10-CM | POA: Diagnosis not present

## 2021-09-22 ENCOUNTER — Other Ambulatory Visit: Payer: Self-pay | Admitting: *Deleted

## 2021-09-22 NOTE — Patient Outreach (Signed)
  Care Coordination   09/22/2021 Name: Nathaniel Kline MRN: [id number] DOB: [birth year]   Care Coordination Outreach Attempts: Contact was made with the patient today to offer care coordination services as a benefit of their health plan. Patient declined services.  Follow Up Plan:  No further outreach attempts will be made at this time.   Encounter Outcome:  Pt. Refused  Care Coordination Interventions Activated:  No   Care Coordination Interventions:  No, not indicated    Emelia Loron RN, BSN [place] [phone] [email or web address]

## 2021-09-28 DIAGNOSIS — U071 COVID-19: Secondary | ICD-10-CM | POA: Diagnosis not present

## 2021-10-13 ENCOUNTER — Encounter: Payer: Self-pay | Admitting: General Surgery

## 2021-10-13 ENCOUNTER — Ambulatory Visit (INDEPENDENT_AMBULATORY_CARE_PROVIDER_SITE_OTHER): Payer: Medicare PPO | Admitting: General Surgery

## 2021-10-13 ENCOUNTER — Other Ambulatory Visit: Payer: Self-pay | Admitting: General Surgery

## 2021-10-13 VITALS — BP 111/65 | HR 71 | Temp 97.4°F | Resp 16 | Ht 78.0 in | Wt 281.0 lb

## 2021-10-13 DIAGNOSIS — N6322 Unspecified lump in the left breast, upper inner quadrant: Secondary | ICD-10-CM

## 2021-10-13 DIAGNOSIS — E039 Hypothyroidism, unspecified: Secondary | ICD-10-CM | POA: Diagnosis not present

## 2021-10-13 NOTE — Progress Notes (Signed)
Nathaniel Kline; [id number]; 12-Feb-1951   HPI Is a 71 year old black male who was referred to my care for evaluation treatment of a soft tissue mass on his back as well as a possible lump in the left breast.  Patient states the back mass has been present for many years and has not changed in size.  No drainage has been noted.  He also recently has felt increased warmth in the left breast.  He is currently being treated by urology for an elevated PSA.  He denies any immediate family history of breast cancer. Past Medical History:  Diagnosis Date   Alcohol abuse    HLD (hyperlipidemia)    HTN (hypertension)     Past Surgical History:  Procedure Laterality Date   2 caths  10/21/05   caridac cath    Family History  Problem Relation Age of Onset   Hypertension Other        family hx   Diabetes Mother    High blood pressure Mother    High blood pressure Sister    High blood pressure Brother    Cancer Brother    High blood pressure Sister     Current Outpatient Medications on File Prior to Visit  Medication Sig Dispense Refill   amLODipine (NORVASC) 10 MG tablet Take 1 tablet (10 mg total) by mouth daily. No more refills will be approved by this provider as office is now permanently closed, patient will need to find another primary care provider. 90 tablet 0   aspirin 81 MG EC tablet Take 81 mg by mouth daily.       Multiple Vitamin (MULTIVITAMIN) tablet Take 1 tablet by mouth daily.       tiZANidine (ZANAFLEX) 2 MG tablet TAKE 1 TABLET (2 MG TOTAL) BY MOUTH 2 (TWO) TIMES DAILY AS NEEDED FOR MUSCLE SPASMS. 60 tablet 1   vitamin C (ASCORBIC ACID) 500 MG tablet Take 500 mg by mouth daily.     No current facility-administered medications on file prior to visit.    Allergies  Allergen Reactions   Other     Peanut butter, carrots, celery     Social History   Substance and Sexual Activity  Alcohol Use Yes   Comment: 1/2 pint at weekends    Social History   Tobacco Use  Smoking  Status Former   Packs/day: 0.50   Years: 19.00   Total pack years: 9.50   Types: Cigarettes   Start date: 08/21/1974   Quit date: 08/20/1993   Years since quitting: 28.1  Smokeless Tobacco Never  Tobacco Comments   tobacco use - no    Review of Systems  Constitutional: Negative.   HENT: Negative.    Eyes: Negative.   Respiratory: Negative.    Cardiovascular: Negative.   Gastrointestinal: Negative.   Genitourinary: Negative.   Musculoskeletal: Negative.   Skin: Negative.   Neurological: Negative.   Endo/Heme/Allergies: Negative.   Psychiatric/Behavioral: Negative.      Objective   Vitals:   10/13/21 0904  BP: 111/65  Pulse: 71  Resp: 16  Temp: (!) 97.4 F (36.3 C)  SpO2: 92%    Physical Exam Vitals reviewed.  Constitutional:      Appearance: Normal appearance. He is not ill-appearing.  HENT:     Head: Normocephalic and atraumatic.  Cardiovascular:     Rate and Rhythm: Normal rate and regular rhythm.     Heart sounds: Normal heart sounds. No murmur heard.    No friction rub.  No gallop.  Pulmonary:     Effort: Pulmonary effort is normal. No respiratory distress.     Breath sounds: Normal breath sounds. No stridor. No wheezing, rhonchi or rales.  Skin:    General: Skin is warm and dry.     Comments: A large 7 cm subcutaneous rubbery oval mobile mass is noted in the back just to the right of midline.  No punctum is present.  No erythema or drainage is noted.  Neurological:     Mental Status: He is alert and oriented to person, place, and time.   Breast: Patient does have an increase nonspecific density in the upper, inner quadrant of the left breast.  He does have a significant amount of breast tissue bilaterally and it is very lumpy bumpy in nature.  No dominant masses noted in the right breast.  No nipple discharge or dimpling is noted.  Both axillas are negative for palpable nodes.  Assessment  Lipoma of back, benign Indistinct left breast mass Plan  We will  get diagnostic mammograms and ultrasound to further assess the left breast.  Further management is pending those results.  I told the patient that the lipoma on his back only need to be excised if it was bothersome or changes in size.  Further management is pending the breast work-up.  I will see him after the mammograms.

## 2021-10-26 ENCOUNTER — Ambulatory Visit (HOSPITAL_COMMUNITY)
Admission: RE | Admit: 2021-10-26 | Discharge: 2021-10-26 | Disposition: A | Discharge status: Home / Self Care | Payer: Medicare PPO | Source: Ambulatory Visit | Attending: General Surgery | Admitting: General Surgery

## 2021-10-26 ENCOUNTER — Encounter (HOSPITAL_COMMUNITY): Payer: Medicare PPO

## 2021-10-26 DIAGNOSIS — N6322 Unspecified lump in the left breast, upper inner quadrant: Secondary | ICD-10-CM

## 2021-10-26 DIAGNOSIS — N62 Hypertrophy of breast: Secondary | ICD-10-CM | POA: Diagnosis not present

## 2021-11-10 ENCOUNTER — Ambulatory Visit: Payer: Medicare PPO | Admitting: General Surgery

## 2021-11-16 ENCOUNTER — Telehealth (INDEPENDENT_AMBULATORY_CARE_PROVIDER_SITE_OTHER): Payer: Medicare PPO | Admitting: General Surgery

## 2021-11-16 ENCOUNTER — Ambulatory Visit: Payer: Medicare PPO | Admitting: General Surgery

## 2021-11-16 DIAGNOSIS — Z09 Encounter for follow-up examination after completed treatment for conditions other than malignant neoplasm: Secondary | ICD-10-CM

## 2021-11-16 NOTE — Telephone Encounter (Signed)
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  No evidence of malignancy.  I did discuss this with the patient.  At this point, this is something that can just be monitored.  He was told to follow-up with me should he have a significant increase in the size of the lipoma.  He does not want to undergo surgery unless absolutely necessary.  I told him that this did not need to be removed at the present time.  He will follow-up with me as needed.

## 2021-11-21 ENCOUNTER — Ambulatory Visit: Payer: Medicare Other | Admitting: Cardiology

## 2021-12-28 ENCOUNTER — Other Ambulatory Visit: Payer: Medicare PPO

## 2021-12-30 ENCOUNTER — Other Ambulatory Visit: Payer: Medicare PPO

## 2022-01-04 ENCOUNTER — Ambulatory Visit: Payer: Medicare PPO | Admitting: Urology

## 2022-01-04 DIAGNOSIS — N138 Other obstructive and reflux uropathy: Secondary | ICD-10-CM

## 2022-01-04 DIAGNOSIS — R972 Elevated prostate specific antigen [PSA]: Secondary | ICD-10-CM

## 2022-01-11 ENCOUNTER — Ambulatory Visit: Payer: Medicare PPO | Attending: Cardiology | Admitting: Cardiology

## 2022-01-11 ENCOUNTER — Encounter: Payer: Self-pay | Admitting: Cardiology

## 2022-01-11 VITALS — BP 124/80 | HR 89 | Ht 78.0 in | Wt 279.0 lb

## 2022-01-11 DIAGNOSIS — I10 Essential (primary) hypertension: Secondary | ICD-10-CM

## 2022-01-11 DIAGNOSIS — R0609 Other forms of dyspnea: Secondary | ICD-10-CM

## 2022-01-11 DIAGNOSIS — I2510 Atherosclerotic heart disease of native coronary artery without angina pectoris: Secondary | ICD-10-CM | POA: Diagnosis not present

## 2022-01-11 MED ORDER — ROSUVASTATIN CALCIUM 5 MG PO TABS: 5.0000 mg | ORAL_TABLET | Freq: Every day | ORAL | 3 refills | Status: DC

## 2022-01-11 NOTE — Patient Instructions (Signed)
Medication Instructions:  Your physician has recommended you make the following change in your medication:  Start Rosuvastatin (Crestor) 5 mg tablets once daily.   Labwork: None  Testing/Procedures: Your physician has requested that you have an exercise tolerance test. For further information please visit [email or web address]. Please also follow instruction sheet, as given.   Follow-Up: Follow up with Dr. Harl Bowie in 6 months.   Any Other Special Instructions Will Be Listed Below (If Applicable).     If you need a refill on your cardiac medications before your next appointment, please call your pharmacy.

## 2022-01-11 NOTE — Progress Notes (Signed)
Clinical Summary Nathaniel Kline is a 71 y.o.male seen today for follow up of the following medical problems.    1. 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 2-3/10 in severity. Often at rest. No diaphoresis, mild SOB. Feeling of needing to belch, acid like taste. More often occurs with spicy food.  Takes zantac just prn.   - some chest pains at times. Left sided tingling like feeling, can occur at rest or with acitivity. Often when laying in certain positions. No other associated symptoms. Lasts just 2-3 seconds.  - does not some recent DOE, for example walking mailbox or up 1 flight of stairs. No chest pain. No recent edema.   - had been on atorvastatin at our last visit in 2019, reports caused dizziness and stopped.    2. HTN - compliant with meds     Past Medical History:  Diagnosis Date   Alcohol abuse    HLD (hyperlipidemia)    HTN (hypertension)      Allergies  Allergen Reactions   Other     Peanut butter, carrots, celery      Current Outpatient Medications  Medication Sig Dispense Refill   amLODipine (NORVASC) 10 MG tablet Take 1 tablet (10 mg total) by mouth daily. No more refills will be approved by this provider as office is now permanently closed, patient will need to find another primary care provider. 90 tablet 0   aspirin 81 MG EC tablet Take 81 mg by mouth daily.       Multiple Vitamin (MULTIVITAMIN) tablet Take 1 tablet by mouth daily.       tiZANidine (ZANAFLEX) 2 MG tablet TAKE 1 TABLET (2 MG TOTAL) BY MOUTH 2 (TWO) TIMES DAILY AS NEEDED FOR MUSCLE SPASMS. 60 tablet 1   vitamin C (ASCORBIC ACID) 500 MG tablet Take 500 mg by mouth daily.     No current facility-administered medications for this visit.     Past Surgical History:  Procedure Laterality Date   2 caths  10/21/05   caridac cath     Allergies  Allergen Reactions    Other     Peanut butter, carrots, celery       Family History  Problem Relation Age of Onset   Hypertension Other        family hx   Diabetes Mother    High blood pressure Mother    High blood pressure Sister    High blood pressure Brother    Cancer Brother    High blood pressure Sister      Social History Nathaniel Kline reports that he quit smoking about 28 years ago. His smoking use included cigarettes. He started smoking about 47 years ago. He has a 9.50 pack-year smoking history. He has never used smokeless tobacco. Nathaniel Kline reports current alcohol use.   Review of Systems CONSTITUTIONAL: No weight loss, fever, chills, weakness or fatigue.  HEENT: Eyes: No visual loss, blurred vision, double vision or yellow sclerae.No hearing loss, sneezing, congestion, runny nose or sore throat.  SKIN: No rash or itching.  CARDIOVASCULAR: pe rhpi RESPIRATORY: per hpi GASTROINTESTINAL: No anorexia, nausea, vomiting or diarrhea. No abdominal pain or blood.  GENITOURINARY: No burning on urination, no polyuria NEUROLOGICAL: No headache, dizziness, syncope, paralysis, ataxia, numbness or tingling in the extremities. No change in bowel or bladder control.  MUSCULOSKELETAL: No muscle,  back pain, joint pain or stiffness.  LYMPHATICS: No enlarged nodes. No history of splenectomy.  PSYCHIATRIC: No history of depression or anxiety.  ENDOCRINOLOGIC: No reports of sweating, cold or heat intolerance. No polyuria or polydipsia.  Marland Kitchen   Physical Examination Today's Vitals   01/11/22 1321  BP: 124/80  Pulse: 89  Weight: 279 lb (126.6 kg)  Height: '6\' 6"'$  (1.981 m)   Body mass index is 32.24 kg/m.  Gen: resting comfortably, no acute distress HEENT: no scleral icterus, pupils equal round and reactive, no palptable cervical adenopathy,  CV: RRR, no m/r/g no jvd Resp: Clear to auscultation bilaterally GI: abdomen is soft, non-tender, non-distended, normal bowel sounds, no hepatosplenomegaly MSK:  extremities are warm, no edema.  Skin: warm, no rash Neuro:  no focal deficits Psych: appropriate affect   Diagnostic Studies 10/2005 Cath Central aortic pressure 136/96 with a mean of 115.  LV pressure is 129/80  with LVEDP of 11.  There was no aortic stenosis.    Left main was normal.    LAD was a long vessel wrapping the apex.  It gave off a large branching  first diagonal and a small second diagonal.  There was a 40% lesion in the  mid-LAD spanning the takeoff of the first diagonal.  This was mildly hazy in  one view.  There was also 40% lesion in the proximal portion of the  diagonal.    Left circumflex was a moderate-size vessel made up of a large OM1 and a  small OM2.  It was angiographically normal.    The right coronary artery was a very large dominant vessel.  It gave off a  RV Leveda Kendrix, large branching PDA and two normal-size posterolaterals.    Left ventriculogram done in the RAO position showed an EF of 55% with distal  anterior wall dyssynchrony.  There was no mitral regurgitation.  Left  ventriculogram done in the LAO position showed an EF of 55-60% with normal  septal and lateral wall motion.    ASSESSMENT:  1. Moderate nonobstructive coronary artery disease with a questionable      hazy lesion in the mid-left anterior descending artery.  2. Normal left ventricular function with distal anterior wall dyssynchrony      on the RAO left ventriculogram.  The LAO ventriculogram was normal.  3. Abnormal EKG with stuttering chest pain suggestive of possible acute      coronary syndrome.    PLAN/DISCUSSION:  The lesion in his LAD does not look obstructive but given  his symptoms and abnormal ECG, I do think it is reasonable to proceed with  IVUS of the LAD to evaluate for thrombus or underlying significant filling  defect.  This will be done by Dr. Burt Knack.  He will need aggressive risk  factor modification as well as control of his blood pressure.   FINDINGS:  The  distal LAD where we began pullback was normal.  There was  mild eccentric plaque distal to the area of interest.  As we approached the  area of interest there was concentric plaquing with very mild calcification  present; there was actually a large atheroma burden.  However, the luminal  area remained nonobstructed throughout.  The lumen at the tightest area of  stenosis had an area of 6.4 square millimeters.  The proximal LAD had very  mild concentric plaquing and no other disease was noted.    ASSESSMENT:  Moderate atherosclerotic concentric plaquing involving the mid  left anterior descending.  There was no thrombus visualized.    PLAN:  Medical therapy with aggressive risk factor modification.        Assessment and Plan   1. Non-obstructive CAD - no chest pain but some recent DOE of unclear etiology - we will plan for a GXT to assess exercise capacity and evlauate for any potential ischemia - if benign may be merely due to deconditioning. Given moderate CAD by cath several years ago further evaluation is warranted.  - side effects on lipitor, will start crestor '5mg'$  daily.    2. HTN - at goal, continue current meds  F/u 6 months   Arnoldo Lenis, M.D.

## 2022-01-16 ENCOUNTER — Other Ambulatory Visit: Payer: Medicare PPO

## 2022-01-24 ENCOUNTER — Ambulatory Visit (HOSPITAL_COMMUNITY): Payer: Medicare PPO

## 2022-01-25 ENCOUNTER — Ambulatory Visit: Payer: Medicare PPO | Admitting: Urology

## 2022-01-30 ENCOUNTER — Ambulatory Visit (HOSPITAL_COMMUNITY): Payer: Medicare PPO

## 2022-02-09 ENCOUNTER — Ambulatory Visit (HOSPITAL_COMMUNITY): Payer: Medicare PPO

## 2022-02-24 ENCOUNTER — Ambulatory Visit (HOSPITAL_COMMUNITY): Payer: Medicare PPO

## 2022-02-24 ENCOUNTER — Encounter (HOSPITAL_COMMUNITY): Payer: Medicare PPO

## 2022-03-07 ENCOUNTER — Ambulatory Visit (HOSPITAL_COMMUNITY): Admission: RE | Admit: 2022-03-07 | Payer: Medicare PPO | Source: Ambulatory Visit

## 2022-03-24 DIAGNOSIS — N289 Disorder of kidney and ureter, unspecified: Secondary | ICD-10-CM | POA: Diagnosis not present

## 2022-03-24 DIAGNOSIS — Z125 Encounter for screening for malignant neoplasm of prostate: Secondary | ICD-10-CM | POA: Diagnosis not present

## 2022-03-24 DIAGNOSIS — I10 Essential (primary) hypertension: Secondary | ICD-10-CM | POA: Diagnosis not present

## 2022-03-24 DIAGNOSIS — E039 Hypothyroidism, unspecified: Secondary | ICD-10-CM | POA: Diagnosis not present

## 2022-03-31 DIAGNOSIS — R809 Proteinuria, unspecified: Secondary | ICD-10-CM | POA: Diagnosis not present

## 2022-03-31 DIAGNOSIS — E785 Hyperlipidemia, unspecified: Secondary | ICD-10-CM | POA: Diagnosis not present

## 2022-03-31 DIAGNOSIS — F1010 Alcohol abuse, uncomplicated: Secondary | ICD-10-CM | POA: Diagnosis not present

## 2022-03-31 DIAGNOSIS — R972 Elevated prostate specific antigen [PSA]: Secondary | ICD-10-CM | POA: Diagnosis not present

## 2022-03-31 DIAGNOSIS — Z0001 Encounter for general adult medical examination with abnormal findings: Secondary | ICD-10-CM | POA: Diagnosis not present

## 2022-03-31 DIAGNOSIS — R7981 Abnormal blood-gas level: Secondary | ICD-10-CM | POA: Diagnosis not present

## 2022-03-31 DIAGNOSIS — I10 Essential (primary) hypertension: Secondary | ICD-10-CM | POA: Diagnosis not present

## 2022-03-31 DIAGNOSIS — N289 Disorder of kidney and ureter, unspecified: Secondary | ICD-10-CM | POA: Diagnosis not present

## 2022-03-31 DIAGNOSIS — R7401 Elevation of levels of liver transaminase levels: Secondary | ICD-10-CM | POA: Diagnosis not present

## 2022-04-20 ENCOUNTER — Encounter: Payer: Self-pay | Admitting: Pulmonary Disease

## 2022-04-20 ENCOUNTER — Encounter: Payer: Self-pay | Admitting: Radiology

## 2022-04-20 ENCOUNTER — Telehealth: Payer: Self-pay | Admitting: Cardiology

## 2022-04-20 ENCOUNTER — Ambulatory Visit (HOSPITAL_COMMUNITY): Admission: RE | Admit: 2022-04-20 | Payer: Medicare PPO | Source: Ambulatory Visit

## 2022-04-20 NOTE — Telephone Encounter (Signed)
7 cancellations? Would not reschedule, reassess at our f/u in May  J Stacye Noori MD

## 2022-04-20 NOTE — Telephone Encounter (Signed)
Patient called after hours answering service to cancel his ETT for this morning - advised by front desk staff at [facility] to let Dr. Harl Bowie know that this is his 7th cancellation and does he want the patient to reschedule?

## 2022-04-20 NOTE — Telephone Encounter (Signed)
error 

## 2022-04-21 ENCOUNTER — Encounter: Payer: Self-pay | Admitting: Urology

## 2022-04-21 ENCOUNTER — Ambulatory Visit: Payer: Medicare PPO | Admitting: Urology

## 2022-04-21 VITALS — BP 133/75 | HR 72

## 2022-04-21 DIAGNOSIS — N401 Enlarged prostate with lower urinary tract symptoms: Secondary | ICD-10-CM

## 2022-04-21 DIAGNOSIS — R351 Nocturia: Secondary | ICD-10-CM | POA: Diagnosis not present

## 2022-04-21 DIAGNOSIS — R972 Elevated prostate specific antigen [PSA]: Secondary | ICD-10-CM | POA: Diagnosis not present

## 2022-04-21 DIAGNOSIS — N138 Other obstructive and reflux uropathy: Secondary | ICD-10-CM

## 2022-04-21 LAB — URINALYSIS, ROUTINE W REFLEX MICROSCOPIC
Bilirubin, UA: NEGATIVE
Glucose, UA: NEGATIVE
Ketones, UA: NEGATIVE
Leukocytes,UA: NEGATIVE
Nitrite, UA: NEGATIVE
Specific Gravity, UA: 1.01 (ref 1.005–1.030)
Urobilinogen, Ur: 0.2 mg/dL (ref 0.2–1.0)
pH, UA: 5.5 (ref 5.0–7.5)

## 2022-04-21 LAB — MICROSCOPIC EXAMINATION: Bacteria, UA: NONE SEEN

## 2022-04-21 MED ORDER — SILODOSIN 8 MG PO CAPS: 8.0000 mg | ORAL_CAPSULE | Freq: Every day | ORAL | 11 refills | Status: DC

## 2022-04-21 NOTE — Patient Instructions (Signed)

## 2022-04-21 NOTE — Progress Notes (Signed)
04/21/2022 11:22 AM   Nathaniel Kline 13-Apr-1950 [id number]  Referring provider: Cecile Sheerer, NP [place],  [place] [address]  Followup elevated PSA and nocturia   HPI: Nathaniel Kline is a 72yo here for followup for elevated PSA and nocturia. PSA increased to 8.0 from 5.4. IPSS 24 QOL 4 on no BPH therapy. He stopped uroxatral due to dizziness. He notes over the past months he has noted a weaker stream and nocturia 4-5x. He has intermittent straining to urinate. No dysuria or gross hematuria.    PMH: Past Medical History:  Diagnosis Date   Alcohol abuse    HLD (hyperlipidemia)    HTN (hypertension)     Surgical History: Past Surgical History:  Procedure Laterality Date   2 caths  10/21/05   caridac cath    Home Medications:  Allergies as of 04/21/2022       Reactions   Other    Peanut butter, carrots, celery        Medication List        Accurate as of April 21, 2022 11:22 AM. If you have any questions, ask your nurse or doctor.          amLODipine 10 MG tablet Commonly known as: NORVASC Take 1 tablet (10 mg total) by mouth daily. No more refills will be approved by this provider as office is now permanently closed, patient will need to find another primary care provider.   ascorbic acid 500 MG tablet Commonly known as: VITAMIN C Take 500 mg by mouth daily.   aspirin EC 81 MG tablet Take 81 mg by mouth daily.   multivitamin tablet Take 1 tablet by mouth daily.   rosuvastatin 5 MG tablet Commonly known as: CRESTOR Take 1 tablet (5 mg total) by mouth daily.   tiZANidine 2 MG tablet Commonly known as: ZANAFLEX TAKE 1 TABLET (2 MG TOTAL) BY MOUTH 2 (TWO) TIMES DAILY AS NEEDED FOR MUSCLE SPASMS.        Allergies:  Allergies  Allergen Reactions   Other     Peanut butter, carrots, celery     Family History: Family History  Problem Relation Age of Onset   Hypertension Other        family hx   Diabetes Mother    High blood pressure  Mother    High blood pressure Sister    High blood pressure Brother    Cancer Brother    High blood pressure Sister     Social History:  reports that he quit smoking about 28 years ago. His smoking use included cigarettes. He started smoking about 47 years ago. He has a 9.50 pack-year smoking history. He has never used smokeless tobacco. He reports current alcohol use. He reports that he does not use drugs.  ROS: All other review of systems were reviewed and are negative except what is noted above in HPI  Physical Exam: BP 133/75   Pulse 72   Constitutional:  Alert and oriented, No acute distress. HEENT: [place] AT, moist mucus membranes.  Trachea midline, no masses. Cardiovascular: No clubbing, cyanosis, or edema. Respiratory: Normal respiratory effort, no increased work of breathing. GI: Abdomen is soft, nontender, nondistended, no abdominal masses GU: No CVA tenderness.  Lymph: No cervical or inguinal lymphadenopathy. Skin: No rashes, bruises or suspicious lesions. Neurologic: Grossly intact, no focal deficits, moving all 4 extremities. Psychiatric: Normal mood and affect.  Laboratory Data: Lab Results  Component Value Date   WBC  8.0 08/04/2020   HGB 15.2 08/04/2020   HCT 45.0 08/04/2020   MCV 100.2 (H) 08/04/2020   PLT 229 08/04/2020    Lab Results  Component Value Date   CREATININE 1.47 (H) 08/04/2020    No results found for: "PSA"  No results found for: "TESTOSTERONE"  Lab Results  Component Value Date   HGBA1C 90 12/30/2016    Urinalysis    Component Value Date/Time   COLORURINE YELLOW 01/19/2008 2204   APPEARANCEUR Clear 06/29/2021 1354   LABSPEC <1.005 (L) 01/19/2008 2204   PHURINE 5.5 01/19/2008 2204   GLUCOSEU Negative 06/29/2021 [place] 01/19/2008 2204   BILIRUBINUR Negative 06/29/2021 1354   [place] 01/19/2008 2204   PROTEINUR 1+ (A) 06/29/2021 1354   PROTEINUR NEGATIVE 01/19/2008 2204   UROBILINOGEN 0.2 01/19/2008 2204    NITRITE Negative 06/29/2021 1354   NITRITE NEGATIVE 01/19/2008 2204   LEUKOCYTESUR Negative 06/29/2021 1354    Lab Results  Component Value Date   LABMICR See below: 06/29/2021   WBCUA 0-5 06/29/2021   LABEPIT 0-10 06/29/2021   MUCUS Present 09/29/2020   BACTERIA Few (A) 06/29/2021    Pertinent Imaging:  No results found for this or any previous visit.  No results found for this or any previous visit.  No results found for this or any previous visit.  No results found for this or any previous visit.  No results found for this or any previous visit.  No valid procedures specified. No results found for this or any previous visit.  No results found for this or any previous visit.   Assessment & Plan:    1. Elevated PSA -followup 2 months with PSA - Urinalysis, Routine w reflex microscopic  2. Benign prostatic hyperplasia with urinary obstruction -we will start rapaflo '8mg'$  qhs  3. Nocturia -rapaflo '8mg'$  whs   No follow-ups on file.  Nicolette Bang, MD  [facility] [place]

## 2022-04-24 LAB — URINE CULTURE: Organism ID, Bacteria: NO GROWTH

## 2022-05-02 ENCOUNTER — Ambulatory Visit (HOSPITAL_COMMUNITY): Payer: Medicare PPO | Attending: Family Medicine

## 2022-06-16 ENCOUNTER — Other Ambulatory Visit: Payer: Medicare PPO

## 2022-06-23 ENCOUNTER — Ambulatory Visit: Payer: Medicare PPO | Admitting: Urology

## 2022-06-23 DIAGNOSIS — N138 Other obstructive and reflux uropathy: Secondary | ICD-10-CM

## 2022-06-23 DIAGNOSIS — R351 Nocturia: Secondary | ICD-10-CM

## 2022-06-26 ENCOUNTER — Ambulatory Visit: Payer: Medicare PPO | Admitting: Cardiology

## 2022-06-29 ENCOUNTER — Ambulatory Visit: Payer: Medicare PPO | Admitting: Cardiology

## 2022-08-01 ENCOUNTER — Encounter: Payer: Self-pay | Admitting: *Deleted

## 2022-08-25 ENCOUNTER — Telehealth: Payer: Self-pay | Admitting: Internal Medicine

## 2022-08-25 NOTE — Telephone Encounter (Signed)
Left patient a message to schedule an OV before colonoscopy due to constipation and etoch use.

## 2022-09-13 ENCOUNTER — Ambulatory Visit: Payer: Medicare PPO | Admitting: Gastroenterology

## 2022-09-19 ENCOUNTER — Ambulatory Visit: Payer: Medicare PPO | Admitting: Gastroenterology

## 2022-09-23 NOTE — Progress Notes (Deleted)
Referring Provider:*** Primary Care Physician:  Lupita Raider, NP Primary Gastroenterologist:  Dr. Bonnetta Barry chief complaint on file.   HPI:   Nathaniel Kline is a 72 y.o. male presenting today at the request of ***to discuss scheduling colonoscopy.  Prior colonoscopy: Family history of colon cancer: Family history of colon polyps:   Past Medical History:  Diagnosis Date   Alcohol abuse    HLD (hyperlipidemia)    HTN (hypertension)     Past Surgical History:  Procedure Laterality Date   2 caths  10/21/05   caridac cath    Current Outpatient Medications  Medication Sig Dispense Refill   amLODipine (NORVASC) 10 MG tablet Take 1 tablet (10 mg total) by mouth daily. No more refills will be approved by this provider as office is now permanently closed, patient will need to find another primary care provider. 90 tablet 0   aspirin 81 MG EC tablet Take 81 mg by mouth daily.       Multiple Vitamin (MULTIVITAMIN) tablet Take 1 tablet by mouth daily.       rosuvastatin (CRESTOR) 5 MG tablet Take 1 tablet (5 mg total) by mouth daily. 90 tablet 3   silodosin (RAPAFLO) 8 MG CAPS capsule Take 1 capsule (8 mg total) by mouth at bedtime. 30 capsule 11   tiZANidine (ZANAFLEX) 2 MG tablet TAKE 1 TABLET (2 MG TOTAL) BY MOUTH 2 (TWO) TIMES DAILY AS NEEDED FOR MUSCLE SPASMS. 60 tablet 1   vitamin C (ASCORBIC ACID) 500 MG tablet Take 500 mg by mouth daily.     No current facility-administered medications for this visit.    Allergies as of 09/25/2022 - Review Complete 04/21/2022  Allergen Reaction Noted   Other  05/10/2017    Family History  Problem Relation Age of Onset   Hypertension Other        family hx   Diabetes Mother    High blood pressure Mother    High blood pressure Sister    High blood pressure Brother    Cancer Brother    High blood pressure Sister     Social History   Socioeconomic History   Marital status: Single    Spouse name: Not on file   Number of children: 2    Years of education: Not on file   Highest education level: Not on file  Occupational History   Occupation: retired  Tobacco Use   Smoking status: Former    Current packs/day: 0.00    Average packs/day: 0.5 packs/day for 19.0 years (9.5 ttl pk-yrs)    Types: Cigarettes    Start date: 08/21/1974    Quit date: 08/20/1993    Years since quitting: 29.1   Smokeless tobacco: Never   Tobacco comments:    tobacco use - no  Vaping Use   Vaping status: Never Used  Substance and Sexual Activity   Alcohol use: Yes    Comment: 1/2 pint at weekends   Drug use: No   Sexual activity: Yes    Partners: Female  Other Topics Concern   Not on file  Social History Narrative   Single.Disabled, does not get regular exercise.    Social Determinants of Health   Financial Resource Strain: Not on file  Food Insecurity: Not on file  Transportation Needs: Not on file  Physical Activity: Not on file  Stress: Not on file  Social Connections: Not on file  Intimate Partner Violence: Not on file    Review of  Systems: Gen: Denies any fever, chills, fatigue, weight loss, lack of appetite.  CV: Denies chest pain, heart palpitations, peripheral edema, syncope.  Resp: Denies shortness of breath at rest or with exertion. Denies wheezing or cough.  GI: Denies dysphagia or odynophagia. Denies jaundice, hematemesis, fecal incontinence. GU : Denies urinary burning, urinary frequency, urinary hesitancy MS: Denies joint pain, muscle weakness, cramps, or limitation of movement.  Derm: Denies rash, itching, dry skin Psych: Denies depression, anxiety, memory loss, and confusion Heme: Denies bruising, bleeding, and enlarged lymph nodes.  Physical Exam: There were no vitals taken for this visit. General:   Alert and oriented. Pleasant and cooperative. Well-nourished and well-developed.  Head:  Normocephalic and atraumatic. Eyes:  Without icterus, sclera clear and conjunctiva pink.  Ears:  Normal auditory  acuity. Lungs:  Clear to auscultation bilaterally. No wheezes, rales, or rhonchi. No distress.  Heart:  S1, S2 present without murmurs appreciated.  Abdomen:  +BS, soft, non-tender and non-distended. No HSM noted. No guarding or rebound. No masses appreciated.  Rectal:  Deferred  Msk:  Symmetrical without gross deformities. Normal posture. Extremities:  Without edema. Neurologic:  Alert and  oriented x4;  grossly normal neurologically. Skin:  Intact without significant lesions or rashes. Psych:  Alert and cooperative. Normal mood and affect.    Assessment:     Plan:  ***   [name], PA-C [facility] 09/25/2022

## 2022-09-25 ENCOUNTER — Ambulatory Visit: Payer: Medicare PPO | Admitting: Gastroenterology

## 2022-09-26 ENCOUNTER — Ambulatory Visit: Payer: Medicare PPO | Admitting: Gastroenterology

## 2022-10-10 DIAGNOSIS — I10 Essential (primary) hypertension: Secondary | ICD-10-CM | POA: Diagnosis not present

## 2022-10-10 DIAGNOSIS — N289 Disorder of kidney and ureter, unspecified: Secondary | ICD-10-CM | POA: Diagnosis not present

## 2022-10-10 DIAGNOSIS — R972 Elevated prostate specific antigen [PSA]: Secondary | ICD-10-CM | POA: Diagnosis not present

## 2022-10-10 DIAGNOSIS — E039 Hypothyroidism, unspecified: Secondary | ICD-10-CM | POA: Diagnosis not present

## 2022-10-17 DIAGNOSIS — R809 Proteinuria, unspecified: Secondary | ICD-10-CM | POA: Diagnosis not present

## 2022-10-17 DIAGNOSIS — I129 Hypertensive chronic kidney disease with stage 1 through stage 4 chronic kidney disease, or unspecified chronic kidney disease: Secondary | ICD-10-CM | POA: Diagnosis not present

## 2022-10-17 DIAGNOSIS — I10 Essential (primary) hypertension: Secondary | ICD-10-CM | POA: Diagnosis not present

## 2022-10-17 DIAGNOSIS — R7401 Elevation of levels of liver transaminase levels: Secondary | ICD-10-CM | POA: Diagnosis not present

## 2022-10-17 DIAGNOSIS — F1010 Alcohol abuse, uncomplicated: Secondary | ICD-10-CM | POA: Diagnosis not present

## 2022-10-17 DIAGNOSIS — R7981 Abnormal blood-gas level: Secondary | ICD-10-CM | POA: Diagnosis not present

## 2022-10-17 DIAGNOSIS — N183 Chronic kidney disease, stage 3 unspecified: Secondary | ICD-10-CM | POA: Diagnosis not present

## 2022-10-17 DIAGNOSIS — E039 Hypothyroidism, unspecified: Secondary | ICD-10-CM | POA: Diagnosis not present

## 2022-10-17 DIAGNOSIS — E785 Hyperlipidemia, unspecified: Secondary | ICD-10-CM | POA: Diagnosis not present

## 2022-10-24 NOTE — Progress Notes (Deleted)
Referring Provider: Lupita Raider, NP Primary Care Physician:  Lupita Raider, NP Primary GI Physician: Dr. Marletta Lor  No chief complaint on file.   HPI:   Nathaniel Kline is a 72 y.o. male presenting today  at the request of ***to discuss scheduling colonoscopy.  Notably, our office had previously tried to schedule patient for colonoscopy on 5 different occasions in 2016.  He was ultimately advised that he could try another gastroenterologist office to complete his colon cancer screening.   Prior colonoscopy: Family history of colon cancer: Family history of colon polyps:  Past Medical History:  Diagnosis Date   Alcohol abuse    HLD (hyperlipidemia)    HTN (hypertension)     Past Surgical History:  Procedure Laterality Date   2 caths  10/21/05   caridac cath    Current Outpatient Medications  Medication Sig Dispense Refill   amLODipine (NORVASC) 10 MG tablet Take 1 tablet (10 mg total) by mouth daily. No more refills will be approved by this provider as office is now permanently closed, patient will need to find another primary care provider. 90 tablet 0   aspirin 81 MG EC tablet Take 81 mg by mouth daily.       Multiple Vitamin (MULTIVITAMIN) tablet Take 1 tablet by mouth daily.       rosuvastatin (CRESTOR) 5 MG tablet Take 1 tablet (5 mg total) by mouth daily. 90 tablet 3   silodosin (RAPAFLO) 8 MG CAPS capsule Take 1 capsule (8 mg total) by mouth at bedtime. 30 capsule 11   tiZANidine (ZANAFLEX) 2 MG tablet TAKE 1 TABLET (2 MG TOTAL) BY MOUTH 2 (TWO) TIMES DAILY AS NEEDED FOR MUSCLE SPASMS. 60 tablet 1   vitamin C (ASCORBIC ACID) 500 MG tablet Take 500 mg by mouth daily.     No current facility-administered medications for this visit.    Allergies as of 10/26/2022 - Review Complete 04/21/2022  Allergen Reaction Noted   Other  05/10/2017    Family History  Problem Relation Age of Onset   Hypertension Other        family hx   Diabetes Mother    High blood pressure  Mother    High blood pressure Sister    High blood pressure Brother    Cancer Brother    High blood pressure Sister     Social History   Socioeconomic History   Marital status: Single    Spouse name: Not on file   Number of children: 2   Years of education: Not on file   Highest education level: Not on file  Occupational History   Occupation: retired  Tobacco Use   Smoking status: Former    Current packs/day: 0.00    Average packs/day: 0.5 packs/day for 19.0 years (9.5 ttl pk-yrs)    Types: Cigarettes    Start date: 08/21/1974    Quit date: 08/20/1993    Years since quitting: 29.1   Smokeless tobacco: Never   Tobacco comments:    tobacco use - no  Vaping Use   Vaping status: Never Used  Substance and Sexual Activity   Alcohol use: Yes    Comment: 1/2 pint at weekends   Drug use: No   Sexual activity: Yes    Partners: Female  Other Topics Concern   Not on file  Social History Narrative   Single.Disabled, does not get regular exercise.    Social Determinants of Health   Financial Resource Strain: Not on file  Food Insecurity: Not on file  Transportation Needs: Not on file  Physical Activity: Not on file  Stress: Not on file  Social Connections: Not on file    Review of Systems: Gen: Denies fever, chills, anorexia. Denies fatigue, weakness, weight loss.  CV: Denies chest pain, palpitations, syncope, peripheral edema, and claudication. Resp: Denies dyspnea at rest, cough, wheezing, coughing up blood, and pleurisy. GI: Denies vomiting blood, jaundice, and fecal incontinence.   Denies dysphagia or odynophagia. Derm: Denies rash, itching, dry skin Psych: Denies depression, anxiety, memory loss, confusion. No homicidal or suicidal ideation.  Heme: Denies bruising, bleeding, and enlarged lymph nodes.  Physical Exam: There were no vitals taken for this visit. General:   Alert and oriented. No distress noted. Pleasant and cooperative.  Head:  Normocephalic and  atraumatic. Eyes:  Conjuctiva clear without scleral icterus. Heart:  S1, S2 present without murmurs appreciated. Lungs:  Clear to auscultation bilaterally. No wheezes, rales, or rhonchi. No distress.  Abdomen:  +BS, soft, non-tender and non-distended. No rebound or guarding. No HSM or masses noted. Msk:  Symmetrical without gross deformities. Normal posture. Extremities:  Without edema. Neurologic:  Alert and  oriented x4 Psych:  Normal mood and affect.    Assessment:     Plan:  ***   [name], PA-C [facility] 10/26/2022

## 2022-10-26 ENCOUNTER — Ambulatory Visit: Payer: Medicare PPO | Admitting: Gastroenterology

## 2022-10-26 NOTE — Progress Notes (Signed)
Referring Provider: Lupita Raider, NP Primary Care Physician:  Lupita Raider, NP Primary Gastroenterologist:  Dr. Marletta Lor  Chief Complaint  Patient presents with   Colonoscopy    Colonoscopy screening.     HPI:   Nathaniel Kline is a 72 y.o. male presenting today to discuss scheduling screening colonoscopy.   Notably, our office had previously tried to schedule patient for colonoscopy on several different occasions in 2016.  He was ultimately advised that he could try another gastroenterologist office to complete his colon cancer screening. No records of colonoscopy in our system.    Today: Prior colonoscopy: Had a colonoscopy in the past. Reports it was at [facility]. No polyps in the past.  Family history of colon cancer: None Family history of colon polyps: None  Bowels usually move daily. Some days are loose and other days are firm but denies any hard stools/constipation. Depends on what he eats. The more vegetables, the better his stools are. Stools are more firm if eating more meats. No abdominal pain, brbpr, melena.   Rare heartburn that responds well to tums. No dysphagia. Occasional nausea, but nothing routine. No vomiting.   Past Medical History:  Diagnosis Date   Alcohol abuse    HLD (hyperlipidemia)    HTN (hypertension)     Past Surgical History:  Procedure Laterality Date   2 caths  10/21/05   caridac cath    Current Outpatient Medications  Medication Sig Dispense Refill   amLODipine (NORVASC) 10 MG tablet Take 1 tablet (10 mg total) by mouth daily. No more refills will be approved by this provider as office is now permanently closed, patient will need to find another primary care provider. 90 tablet 0   aspirin 81 MG EC tablet Take 81 mg by mouth daily.       Multiple Vitamin (MULTIVITAMIN) tablet Take 1 tablet by mouth daily.       RESTASIS MULTIDOSE 0.05 % ophthalmic emulsion 1 drop 2 (two) times daily.     rosuvastatin (CRESTOR) 5 MG tablet Take 1 tablet (5  mg total) by mouth daily. 90 tablet 3   silodosin (RAPAFLO) 8 MG CAPS capsule Take 1 capsule (8 mg total) by mouth at bedtime. 30 capsule 11   tiZANidine (ZANAFLEX) 2 MG tablet TAKE 1 TABLET (2 MG TOTAL) BY MOUTH 2 (TWO) TIMES DAILY AS NEEDED FOR MUSCLE SPASMS. 60 tablet 1   vitamin C (ASCORBIC ACID) 500 MG tablet Take 500 mg by mouth daily.     No current facility-administered medications for this visit.    Allergies as of 10/27/2022 - Review Complete 10/27/2022  Allergen Reaction Noted   Other  05/10/2017    Family History  Problem Relation Age of Onset   Diabetes Mother    High blood pressure Mother    High blood pressure Sister    High blood pressure Sister    High blood pressure Brother    Cancer Brother    Hypertension Other        family hx   Colon cancer Neg Hx    Colonic polyp Neg Hx     Social History   Socioeconomic History   Marital status: Single    Spouse name: Not on file   Number of children: 2   Years of education: Not on file   Highest education level: Not on file  Occupational History   Occupation: retired  Tobacco Use   Smoking status: Former    Current packs/day: 0.00  Average packs/day: 0.5 packs/day for 19.0 years (9.5 ttl pk-yrs)    Types: Cigarettes    Start date: 08/21/1974    Quit date: 08/20/1993    Years since quitting: 29.2   Smokeless tobacco: Never   Tobacco comments:    tobacco use - no  Vaping Use   Vaping status: Never Used  Substance and Sexual Activity   Alcohol use: Yes    Comment: 1/5th every couple of weeks. Drinks beer, wine, and occasionally liquor.   Drug use: No   Sexual activity: Yes    Partners: Female  Other Topics Concern   Not on file  Social History Narrative   Single.Disabled, does not get regular exercise.    Social Determinants of Health   Financial Resource Strain: Not on file  Food Insecurity: Not on file  Transportation Needs: Not on file  Physical Activity: Not on file  Stress: Not on file   Social Connections: Not on file  Intimate Partner Violence: Not on file    Review of Systems: Gen: Denies any fever, chills, cold or flulike symptoms, presyncope, syncope. CV: Denies chest pain, heart palpitations. Resp: Denies shortness of breath, cough.  GI:  See HPI GU : Denies urinary burning, urinary frequency, urinary hesitancy MS: Denies joint pain. Derm: Denies rash. Psych: Denies depression, anxiety. Heme: See HPI  Physical Exam: BP 135/75 (BP Location: Right Arm, Patient Position: Sitting, Cuff Size: Large)   Pulse 77   Temp 97.9 F (36.6 C) (Temporal)   Ht 6\' 6"  (1.981 m)   Wt 271 lb 3.2 oz (123 kg)   SpO2 95%   BMI 31.34 kg/m  General:   Alert and oriented. Pleasant and cooperative. Well-nourished and well-developed.  Head:  Normocephalic and atraumatic. Eyes:  Without icterus, sclera clear and conjunctiva pink.  Ears:  Normal auditory acuity. Lungs:  Clear to auscultation bilaterally. No wheezes, rales, or rhonchi. No distress.  Heart:  S1, S2 present without murmurs appreciated.  Abdomen:  +BS, soft, non-tender and non-distended. No HSM noted. No guarding or rebound. No masses appreciated.  Rectal:  Deferred  Msk:  Symmetrical without gross deformities. Normal posture. Extremities:  Without edema. Neurologic:  Alert and  oriented x4;  grossly normal neurologically. Skin:  Intact without significant lesions or rashes. Psych:  Normal mood and affect.    Assessment:  72 year old male with history of HTN, HLD, alcohol abuse, nonobstructive CAD, presenting today to discuss scheduling screening colonoscopy.  Patient reports having a colonoscopy at [facility] in the past, but there is no record of this.  He denies any history of polyps and has no significant GI symptoms or alarm symptoms. No family history of colon cancer or colon polyps.     Plan:  Proceed with colonoscopy with propofol by Dr. Marletta Lor in near future. The risks, benefits, and alternatives have  been discussed with the patient in detail. The patient states understanding and desires to proceed.  ASA 3 Follow-up PRN.    [name], PA-C [facility] 10/27/2022

## 2022-10-27 ENCOUNTER — Encounter: Payer: Self-pay | Admitting: Gastroenterology

## 2022-10-27 ENCOUNTER — Ambulatory Visit: Payer: Medicare PPO | Admitting: Gastroenterology

## 2022-10-27 VITALS — BP 135/75 | HR 77 | Temp 97.9°F | Ht 78.0 in | Wt 271.2 lb

## 2022-10-27 DIAGNOSIS — Z1211 Encounter for screening for malignant neoplasm of colon: Secondary | ICD-10-CM | POA: Diagnosis not present

## 2022-10-27 NOTE — Patient Instructions (Signed)
We will get you scheduled for a colonoscopy with Dr. Marletta Lor at [facility].   We will see you back in the office as needed.   It was nice to meet you today!  [name], PA-C [facility]

## 2022-11-01 ENCOUNTER — Telehealth: Payer: Self-pay | Admitting: Cardiology

## 2022-11-01 NOTE — Telephone Encounter (Signed)
Pt c/o Shortness Of Breath: STAT if SOB developed within the last 24 hours or pt is noticeably SOB on the phone  1. Are you currently SOB (can you hear that pt is SOB on the phone)? No   2. How long have you been experiencing SOB? A few months   3. Are you SOB when sitting or when up moving around? Moving around   4. Are you currently experiencing any other symptoms? Fatigue , dizziness

## 2022-11-01 NOTE — Telephone Encounter (Signed)
Returned call to pt. No answer left msg to call back.  

## 2022-11-01 NOTE — Telephone Encounter (Signed)
Patient is returning call and is requesting return call.

## 2022-11-02 NOTE — Telephone Encounter (Signed)
Spoke to pt who stated that for the last few months he has noticed some increasing fatigue with activity and light headedness. Pt stated bp has been fine- did not provide readings. Pt denied dizziness, nausea, vomiting.   Pt has appt with provider on 10/4. Offered pt 9/13 appt, but pt could not make that appt d/t lack of reliable transportation.

## 2022-11-03 NOTE — Telephone Encounter (Signed)
Patient notified and verbalized understanding.

## 2022-11-07 ENCOUNTER — Encounter: Payer: Self-pay | Admitting: *Deleted

## 2022-11-07 ENCOUNTER — Other Ambulatory Visit: Payer: Self-pay | Admitting: *Deleted

## 2022-11-07 ENCOUNTER — Telehealth: Payer: Self-pay | Admitting: *Deleted

## 2022-11-07 MED ORDER — PEG 3350-KCL-NA BICARB-NACL 420 G PO SOLR: 4000.0000 mL | Freq: Once | ORAL | 0 refills | Status: AC

## 2022-11-07 NOTE — Telephone Encounter (Signed)
[employer] PA: Approved Authorization #[id number]  Tracking #[id number] Dates of service 11/27/2022 - 02/26/2023

## 2022-11-22 ENCOUNTER — Encounter (HOSPITAL_COMMUNITY)
Admission: RE | Admit: 2022-11-22 | Discharge: 2022-11-22 | Disposition: A | Discharge status: Home / Self Care | Payer: Medicare PPO | Source: Ambulatory Visit | Attending: Internal Medicine | Admitting: Internal Medicine

## 2022-11-24 ENCOUNTER — Encounter: Payer: Self-pay | Admitting: Cardiology

## 2022-11-24 ENCOUNTER — Ambulatory Visit: Payer: Medicare PPO | Attending: Cardiology | Admitting: Cardiology

## 2022-11-24 VITALS — BP 128/78 | HR 69 | Ht 78.0 in | Wt 269.0 lb

## 2022-11-24 DIAGNOSIS — R0602 Shortness of breath: Secondary | ICD-10-CM | POA: Diagnosis not present

## 2022-11-24 DIAGNOSIS — I2510 Atherosclerotic heart disease of native coronary artery without angina pectoris: Secondary | ICD-10-CM

## 2022-11-24 DIAGNOSIS — R0609 Other forms of dyspnea: Secondary | ICD-10-CM | POA: Diagnosis not present

## 2022-11-24 NOTE — Progress Notes (Signed)
Clinical Summary Mr. Calvin is a 72 y.o.male seen today for follow up of the following medical problems.    1. Non-obstructive CAD/DOE - cath 2007 with moderate non-obstructive disease - echo 09/2010 LVEF 55-60%      SOB ongoing for a few months. Walking up a few stairs will cause some DOE, generalized fatigue. Carrying groceries significant fatigue. . - Hgb and TSH were normal by pcp labs - no specific chest pains        Past Medical History:  Diagnosis Date   Alcohol abuse    HLD (hyperlipidemia)    HTN (hypertension)      Allergies  Allergen Reactions   Other     Peanut butter, carrots, celery      Current Outpatient Medications  Medication Sig Dispense Refill   amLODipine (NORVASC) 10 MG tablet Take 1 tablet (10 mg total) by mouth daily. No more refills will be approved by this provider as office is now permanently closed, patient will need to find another primary care provider. 90 tablet 0   aspirin 81 MG EC tablet Take 81 mg by mouth daily.       Multiple Vitamin (MULTIVITAMIN) tablet Take 1 tablet by mouth daily.       RESTASIS MULTIDOSE 0.05 % ophthalmic emulsion 1 drop 2 (two) times daily.     rosuvastatin (CRESTOR) 5 MG tablet Take 1 tablet (5 mg total) by mouth daily. 90 tablet 3   silodosin (RAPAFLO) 8 MG CAPS capsule Take 1 capsule (8 mg total) by mouth at bedtime. 30 capsule 11   tiZANidine (ZANAFLEX) 2 MG tablet TAKE 1 TABLET (2 MG TOTAL) BY MOUTH 2 (TWO) TIMES DAILY AS NEEDED FOR MUSCLE SPASMS. 60 tablet 1   vitamin C (ASCORBIC ACID) 500 MG tablet Take 500 mg by mouth daily.     No current facility-administered medications for this visit.     Past Surgical History:  Procedure Laterality Date   2 caths  10/21/05   caridac cath     Allergies  Allergen Reactions   Other     Peanut butter, carrots, celery       Family History  Problem Relation Age of Onset   Diabetes Mother    High blood pressure Mother    High blood pressure  Sister    High blood pressure Sister    High blood pressure Brother    Cancer Brother    Hypertension Other        family hx   Colon cancer Neg Hx    Colonic polyp Neg Hx      Social History Mr. Villena reports that he quit smoking about 29 years ago. His smoking use included cigarettes. He started smoking about 48 years ago. He has a 9.5 pack-year smoking history. He has never used smokeless tobacco. Mr. Crysler reports current alcohol use.   Review of Systems CONSTITUTIONAL: No weight loss, fever, chills, weakness or fatigue.  HEENT: Eyes: No visual loss, blurred vision, double vision or yellow sclerae.No hearing loss, sneezing, congestion, runny nose or sore throat.  SKIN: No rash or itching.  CARDIOVASCULAR: per hpi RESPIRATORY: per hpi GASTROINTESTINAL: No anorexia, nausea, vomiting or diarrhea. No abdominal pain or blood.  GENITOURINARY: No burning on urination, no polyuria NEUROLOGICAL: No headache, dizziness, syncope, paralysis, ataxia, numbness or tingling in the extremities. No change in bowel or bladder control.  MUSCULOSKELETAL: No muscle, back pain, joint pain or stiffness.  LYMPHATICS: No enlarged nodes. No history  of splenectomy.  PSYCHIATRIC: No history of depression or anxiety.  ENDOCRINOLOGIC: No reports of sweating, cold or heat intolerance. No polyuria or polydipsia.  Marland Kitchen   Physical Examination Today's Vitals   11/24/22 0830  BP: 128/78  Pulse: 69  SpO2: 96%  Weight: 269 lb (122 kg)  Height: 6\' 6"  (1.981 m)   Body mass index is 31.09 kg/m.  Gen: resting comfortably, no acute distress HEENT: no scleral icterus, pupils equal round and reactive, no palptable cervical adenopathy,  CV: RRR, no m/rg, no jvd Resp: Clear to auscultation bilaterally GI: abdomen is soft, non-tender, non-distended, normal bowel sounds, no hepatosplenomegaly MSK: extremities are warm, no edema.  Skin: warm, no rash Neuro:  no focal deficits Psych: appropriate affect   Diagnostic  Studies 10/2005 Cath Central aortic pressure 136/96 with a mean of 115.  LV pressure is 129/80  with LVEDP of 11.  There was no aortic stenosis.    Left main was normal.    LAD was a long vessel wrapping the apex.  It gave off a large branching  first diagonal and a small second diagonal.  There was a 40% lesion in the  mid-LAD spanning the takeoff of the first diagonal.  This was mildly hazy in  one view.  There was also 40% lesion in the proximal portion of the  diagonal.    Left circumflex was a moderate-size vessel made up of a large OM1 and a  small OM2.  It was angiographically normal.    The right coronary artery was a very large dominant vessel.  It gave off a  RV Gerrit Rafalski, large branching PDA and two normal-size posterolaterals.    Left ventriculogram done in the RAO position showed an EF of 55% with distal  anterior wall dyssynchrony.  There was no mitral regurgitation.  Left  ventriculogram done in the LAO position showed an EF of 55-60% with normal  septal and lateral wall motion.    ASSESSMENT:  1. Moderate nonobstructive coronary artery disease with a questionable      hazy lesion in the mid-left anterior descending artery.  2. Normal left ventricular function with distal anterior wall dyssynchrony      on the RAO left ventriculogram.  The LAO ventriculogram was normal.  3. Abnormal EKG with stuttering chest pain suggestive of possible acute      coronary syndrome.    PLAN/DISCUSSION:  The lesion in his LAD does not look obstructive but given  his symptoms and abnormal ECG, I do think it is reasonable to proceed with  IVUS of the LAD to evaluate for thrombus or underlying significant filling  defect.  This will be done by Dr. Excell Seltzer.  He will need aggressive risk  factor modification as well as control of his blood pressure.   FINDINGS:  The distal LAD where we began pullback was normal.  There was  mild eccentric plaque distal to the area of interest.  As we  approached the  area of interest there was concentric plaquing with very mild calcification  present; there was actually a large atheroma burden.  However, the luminal  area remained nonobstructed throughout.  The lumen at the tightest area of  stenosis had an area of 6.4 square millimeters.  The proximal LAD had very  mild concentric plaquing and no other disease was noted.    ASSESSMENT:  Moderate atherosclerotic concentric plaquing involving the mid  left anterior descending.  There was no thrombus visualized.    PLAN:  Medical therapy with aggressive risk factor modification.          Assessment and Plan   1.SOB/Fatigue - reports exertional SOB and fatigue of unclear etiology - plan for echo initially to evaluate for any underlying cardiac dysfunction, pending results consider exercise nuclear stress test. He did have moderate CAD by cath in 2007  EKG today shows NSR, no acute ischemic changes.      Antoine Poche, M.D.

## 2022-11-24 NOTE — Patient Instructions (Signed)
Medication Instructions:  Your physician recommends that you continue on your current medications as directed. Please refer to the Current Medication list given to you today.  *If you need a refill on your cardiac medications before your next appointment, please call your pharmacy*   Lab Work: None If you have labs (blood work) drawn today and your tests are completely normal, you will receive your results only by: MyChart Message (if you have MyChart) OR A paper copy in the mail If you have any lab test that is abnormal or we need to change your treatment, we will call you to review the results.   Testing/Procedures: Your physician has requested that you have an echocardiogram. Echocardiography is a painless test that uses sound waves to create images of your heart. It provides your doctor with information about the size and shape of your heart and how well your heart's chambers and valves are working. This procedure takes approximately one hour. There are no restrictions for this procedure. Please do NOT wear cologne, perfume, aftershave, or lotions (deodorant is allowed). Please arrive 15 minutes prior to your appointment time.    Follow-Up: At [facility], you and your health needs are our priority.  As part of our continuing mission to provide you with exceptional heart care, we have created designated Provider Care Teams.  These Care Teams include your primary Cardiologist (physician) and Advanced Practice Providers (APPs -  Physician Assistants and Nurse Practitioners) who all work together to provide you with the care you need, when you need it.  We recommend signing up for the patient portal called "MyChart".  Sign up information is provided on this After Visit Summary.  MyChart is used to connect with patients for Virtual Visits (Telemedicine).  Patients are able to view lab/test results, encounter notes, upcoming appointments, etc.  Non-urgent messages can be sent to your  provider as well.   To learn more about what you can do with MyChart, go to [email or web address].    Your next appointment:    Follow up is pending testing results  Provider:   You may see Dina Rich, MD or one of the following Advanced Practice Providers on your designated Care Team:   Randall An, PA-C  Jacolyn Reedy, [place]     Other Instructions

## 2022-11-27 ENCOUNTER — Encounter (HOSPITAL_COMMUNITY): Admission: RE | Payer: Self-pay | Source: Home / Self Care

## 2022-11-27 ENCOUNTER — Telehealth: Payer: Self-pay | Admitting: *Deleted

## 2022-11-27 ENCOUNTER — Ambulatory Visit (HOSPITAL_COMMUNITY): Admission: RE | Admit: 2022-11-27 | Payer: Medicare PPO | Source: Home / Self Care

## 2022-11-27 SURGERY — COLONOSCOPY WITH PROPOFOL
Anesthesia: Monitor Anesthesia Care

## 2022-11-27 NOTE — Telephone Encounter (Signed)
Pt left vm this morning stating he needed to cancel his procedure for this morning. He says he will call back to reschedule. FYI

## 2022-11-29 ENCOUNTER — Encounter: Payer: Self-pay | Admitting: *Deleted

## 2022-11-29 ENCOUNTER — Other Ambulatory Visit: Payer: Self-pay | Admitting: *Deleted

## 2022-11-29 ENCOUNTER — Telehealth: Payer: Self-pay | Admitting: Internal Medicine

## 2022-11-29 MED ORDER — PEG 3350-KCL-NA BICARB-NACL 420 G PO SOLR: 4000.0000 mL | Freq: Once | ORAL | 0 refills | Status: AC

## 2022-11-29 NOTE — Telephone Encounter (Signed)
See previous TE

## 2022-11-29 NOTE — Telephone Encounter (Signed)
Pt has been rescheduled for 01/02/23. Updated instructions mailed and resent prep to pharmacy

## 2022-11-29 NOTE — Telephone Encounter (Signed)
Patient left a message that he wanted to reschedule his colonoscopy

## 2022-11-30 ENCOUNTER — Encounter: Payer: Self-pay | Admitting: *Deleted

## 2022-12-13 ENCOUNTER — Ambulatory Visit: Payer: Medicare PPO | Admitting: Urology

## 2022-12-28 ENCOUNTER — Telehealth: Payer: Self-pay | Admitting: Internal Medicine

## 2022-12-28 NOTE — Telephone Encounter (Signed)
Pt is cancelling his procedure on 01/02/23 due to transportation issues. He will call back to reschedule.

## 2022-12-28 NOTE — Telephone Encounter (Signed)
Patient left a message to cx his appointment at 10:30... that is all the message said

## 2022-12-29 ENCOUNTER — Encounter (HOSPITAL_COMMUNITY): Admission: RE | Admit: 2022-12-29 | Payer: Medicare PPO | Source: Ambulatory Visit

## 2023-01-02 ENCOUNTER — Ambulatory Visit (HOSPITAL_COMMUNITY): Payer: Medicare PPO

## 2023-01-02 ENCOUNTER — Ambulatory Visit (HOSPITAL_COMMUNITY): Admission: RE | Admit: 2023-01-02 | Payer: Medicare PPO | Source: Home / Self Care

## 2023-01-02 ENCOUNTER — Encounter (HOSPITAL_COMMUNITY): Admission: RE | Payer: Self-pay | Source: Home / Self Care

## 2023-01-02 SURGERY — COLONOSCOPY WITH PROPOFOL
Anesthesia: Monitor Anesthesia Care

## 2023-01-25 ENCOUNTER — Ambulatory Visit (HOSPITAL_COMMUNITY): Admission: RE | Admit: 2023-01-25 | Payer: Medicare PPO | Source: Ambulatory Visit

## 2023-02-04 ENCOUNTER — Other Ambulatory Visit: Payer: Self-pay | Admitting: Cardiology

## 2023-02-13 ENCOUNTER — Ambulatory Visit (HOSPITAL_COMMUNITY): Admission: RE | Admit: 2023-02-13 | Payer: Medicare PPO | Source: Ambulatory Visit

## 2023-03-08 ENCOUNTER — Encounter (HOSPITAL_COMMUNITY): Payer: Self-pay

## 2023-03-08 ENCOUNTER — Ambulatory Visit (HOSPITAL_COMMUNITY)
Admission: RE | Admit: 2023-03-08 | Discharge: 2023-03-08 | Disposition: A | Discharge status: Home / Self Care | Payer: Medicare PPO | Source: Ambulatory Visit | Attending: Cardiology | Admitting: Cardiology

## 2023-03-08 DIAGNOSIS — R0609 Other forms of dyspnea: Secondary | ICD-10-CM

## 2023-03-08 DIAGNOSIS — R0602 Shortness of breath: Secondary | ICD-10-CM

## 2023-03-08 NOTE — Progress Notes (Signed)
Patient did Not stay for his echocardiogram after arriving 55 minutes early to his appointment. Patient was gone when I went to get him.                                             Celesta Gentile, RCS

## 2023-03-28 NOTE — Progress Notes (Deleted)
 Referring Provider: Hyacinth Honey, NP Primary Care Physician:  Hyacinth Honey, NP Primary GI Physician: Dr. PIERRETTE Johns chief complaint on file.   HPI:   Nathaniel Kline is a 73 y.o. male presenting today to discuss scheduling screening colonoscopy.  Patient was last seen in the office for the same 10/27/2022.  He had no significant lower GI symptoms or alarm symptoms.  Rare heartburn that responded well to Tums.  No alarming upper GI symptoms.  Reported prior colonoscopy at [facility], but no record of this in the system.  Recommended proceeding with colonoscopy.  Patient canceled his colonoscopy scheduled for 01/02/2023 due to transportation issues.  Since I saw him in the office, patient saw cardiology for dyspnea on exertion.  They have recommended echocardiogram and if unrevealing, consider exercise nuclear stress test.  Patient no showed for his echocardiogram 01/25/2023. ***  Today:   Past Medical History:  Diagnosis Date   Alcohol abuse    HLD (hyperlipidemia)    HTN (hypertension)     Past Surgical History:  Procedure Laterality Date   2 caths  10/21/05   caridac cath    Current Outpatient Medications  Medication Sig Dispense Refill   amLODipine  (NORVASC ) 10 MG tablet Take 1 tablet (10 mg total) by mouth daily. No more refills will be approved by this provider as office is now permanently closed, patient will need to find another primary care provider. 90 tablet 0   aspirin 81 MG EC tablet Take 81 mg by mouth daily.       Multiple Vitamin (MULTIVITAMIN) tablet Take 1 tablet by mouth daily.       RESTASIS MULTIDOSE 0.05 % ophthalmic emulsion 1 drop 2 (two) times daily.     rosuvastatin  (CRESTOR ) 5 MG tablet TAKE 1 TABLET (5 MG TOTAL) BY MOUTH DAILY. 90 tablet 3   silodosin  (RAPAFLO ) 8 MG CAPS capsule Take 1 capsule (8 mg total) by mouth at bedtime. (Patient not taking: Reported on 11/24/2022) 30 capsule 11   tiZANidine  (ZANAFLEX ) 2 MG tablet TAKE 1 TABLET (2 MG TOTAL) BY  MOUTH 2 (TWO) TIMES DAILY AS NEEDED FOR MUSCLE SPASMS. (Patient not taking: Reported on 11/24/2022) 60 tablet 1   vitamin C (ASCORBIC ACID) 500 MG tablet Take 500 mg by mouth daily.     No current facility-administered medications for this visit.    Allergies as of 03/29/2023 - Review Complete 11/24/2022  Allergen Reaction Noted   Other  05/10/2017    Family History  Problem Relation Age of Onset   Diabetes Mother    High blood pressure Mother    High blood pressure Sister    High blood pressure Sister    High blood pressure Brother    Cancer Brother    Hypertension Other        family hx   Colon cancer Neg Hx    Colonic polyp Neg Hx     Social History   Socioeconomic History   Marital status: Single    Spouse name: Not on file   Number of children: 2   Years of education: Not on file   Highest education level: Not on file  Occupational History   Occupation: retired  Tobacco Use   Smoking status: Former    Current packs/day: 0.00    Average packs/day: 0.5 packs/day for 19.0 years (9.5 ttl pk-yrs)    Types: Cigarettes    Start date: 08/21/1974    Quit date: 08/20/1993    Years  since quitting: 29.6   Smokeless tobacco: Never   Tobacco comments:    tobacco use - no  Vaping Use   Vaping status: Never Used  Substance and Sexual Activity   Alcohol use: Yes    Comment: 1/5th every couple of weeks. Drinks beer, wine, and occasionally liquor.   Drug use: No   Sexual activity: Yes    Partners: Female  Other Topics Concern   Not on file  Social History Narrative   Single.Disabled, does not get regular exercise.    Social Drivers of Corporate Investment Banker Strain: Not on file  Food Insecurity: Not on file  Transportation Needs: Not on file  Physical Activity: Not on file  Stress: Not on file  Social Connections: Not on file    Review of Systems: Gen: Denies fever, chills, anorexia. Denies fatigue, weakness, weight loss.  CV: Denies chest pain, palpitations,  syncope, peripheral edema, and claudication. Resp: Denies dyspnea at rest, cough, wheezing, coughing up blood, and pleurisy. GI: Denies vomiting blood, jaundice, and fecal incontinence.   Denies dysphagia or odynophagia. Derm: Denies rash, itching, dry skin Psych: Denies depression, anxiety, memory loss, confusion. No homicidal or suicidal ideation.  Heme: Denies bruising, bleeding, and enlarged lymph nodes.  Physical Exam: There were no vitals taken for this visit. General:   Alert and oriented. No distress noted. Pleasant and cooperative.  Head:  Normocephalic and atraumatic. Eyes:  Conjuctiva clear without scleral icterus. Heart:  S1, S2 present without murmurs appreciated. Lungs:  Clear to auscultation bilaterally. No wheezes, rales, or rhonchi. No distress.  Abdomen:  +BS, soft, non-tender and non-distended. No rebound or guarding. No HSM or masses noted. Msk:  Symmetrical without gross deformities. Normal posture. Extremities:  Without edema. Neurologic:  Alert and  oriented x4 Psych:  Normal mood and affect.    Assessment:     Plan:  ***   Josette Centers, PA-C [facility] 03/29/2023

## 2023-03-29 ENCOUNTER — Ambulatory Visit: Payer: Medicare PPO | Admitting: Gastroenterology

## 2023-03-29 NOTE — Progress Notes (Deleted)
 Referring Provider: Hyacinth Honey, NP Primary Care Physician:  Hyacinth Honey, NP Primary GI Physician: Dr. PIERRETTE Johns chief complaint on file.   HPI:   Nathaniel Kline is a 73 y.o. male  presenting today to discuss scheduling screening colonoscopy.   Patient was last seen in the office for the same 10/27/2022.  He had no significant lower GI symptoms or alarm symptoms.  Rare heartburn that responded well to Tums.  No alarming upper GI symptoms.  Reported prior colonoscopy at [facility], but no record of this in the system.  Recommended proceeding with colonoscopy.   Patient canceled his colonoscopy scheduled for 01/02/2023 due to transportation issues.   Since I saw him in the office, patient saw cardiology for dyspnea on exertion.  They have recommended echocardiogram and if unrevealing, consider exercise nuclear stress test.  Patient no showed for his echocardiogram 01/25/2023. ***   Today:      Past Medical History:  Diagnosis Date   Alcohol abuse    HLD (hyperlipidemia)    HTN (hypertension)     Past Surgical History:  Procedure Laterality Date   2 caths  10/21/05   caridac cath    Current Outpatient Medications  Medication Sig Dispense Refill   amLODipine  (NORVASC ) 10 MG tablet Take 1 tablet (10 mg total) by mouth daily. No more refills will be approved by this provider as office is now permanently closed, patient will need to find another primary care provider. 90 tablet 0   aspirin 81 MG EC tablet Take 81 mg by mouth daily.       Multiple Vitamin (MULTIVITAMIN) tablet Take 1 tablet by mouth daily.       RESTASIS MULTIDOSE 0.05 % ophthalmic emulsion 1 drop 2 (two) times daily.     rosuvastatin  (CRESTOR ) 5 MG tablet TAKE 1 TABLET (5 MG TOTAL) BY MOUTH DAILY. 90 tablet 3   silodosin  (RAPAFLO ) 8 MG CAPS capsule Take 1 capsule (8 mg total) by mouth at bedtime. (Patient not taking: Reported on 11/24/2022) 30 capsule 11   tiZANidine  (ZANAFLEX ) 2 MG tablet TAKE 1 TABLET (2 MG  TOTAL) BY MOUTH 2 (TWO) TIMES DAILY AS NEEDED FOR MUSCLE SPASMS. (Patient not taking: Reported on 11/24/2022) 60 tablet 1   vitamin C (ASCORBIC ACID) 500 MG tablet Take 500 mg by mouth daily.     No current facility-administered medications for this visit.    Allergies as of 03/30/2023 - Review Complete 11/24/2022  Allergen Reaction Noted   Other  05/10/2017    Family History  Problem Relation Age of Onset   Diabetes Mother    High blood pressure Mother    High blood pressure Sister    High blood pressure Sister    High blood pressure Brother    Cancer Brother    Hypertension Other        family hx   Colon cancer Neg Hx    Colonic polyp Neg Hx     Social History   Socioeconomic History   Marital status: Single    Spouse name: Not on file   Number of children: 2   Years of education: Not on file   Highest education level: Not on file  Occupational History   Occupation: retired  Tobacco Use   Smoking status: Former    Current packs/day: 0.00    Average packs/day: 0.5 packs/day for 19.0 years (9.5 ttl pk-yrs)    Types: Cigarettes    Start date: 08/21/1974  Quit date: 08/20/1993    Years since quitting: 29.6   Smokeless tobacco: Never   Tobacco comments:    tobacco use - no  Vaping Use   Vaping status: Never Used  Substance and Sexual Activity   Alcohol use: Yes    Comment: 1/5th every couple of weeks. Drinks beer, wine, and occasionally liquor.   Drug use: No   Sexual activity: Yes    Partners: Female  Other Topics Concern   Not on file  Social History Narrative   Single.Disabled, does not get regular exercise.    Social Drivers of Corporate Investment Banker Strain: Not on file  Food Insecurity: Not on file  Transportation Needs: Not on file  Physical Activity: Not on file  Stress: Not on file  Social Connections: Not on file    Review of Systems: Gen: Denies fever, chills, anorexia. Denies fatigue, weakness, weight loss.  CV: Denies chest pain,  palpitations, syncope, peripheral edema, and claudication. Resp: Denies dyspnea at rest, cough, wheezing, coughing up blood, and pleurisy. GI: Denies vomiting blood, jaundice, and fecal incontinence.   Denies dysphagia or odynophagia. Derm: Denies rash, itching, dry skin Psych: Denies depression, anxiety, memory loss, confusion. No homicidal or suicidal ideation.  Heme: Denies bruising, bleeding, and enlarged lymph nodes.  Physical Exam: There were no vitals taken for this visit. General:   Alert and oriented. No distress noted. Pleasant and cooperative.  Head:  Normocephalic and atraumatic. Eyes:  Conjuctiva clear without scleral icterus. Heart:  S1, S2 present without murmurs appreciated. Lungs:  Clear to auscultation bilaterally. No wheezes, rales, or rhonchi. No distress.  Abdomen:  +BS, soft, non-tender and non-distended. No rebound or guarding. No HSM or masses noted. Msk:  Symmetrical without gross deformities. Normal posture. Extremities:  Without edema. Neurologic:  Alert and  oriented x4 Psych:  Normal mood and affect.    Assessment:     Plan:  ***   Josette Centers, PA-C [facility] 03/30/2023

## 2023-03-30 ENCOUNTER — Ambulatory Visit: Payer: Medicare PPO | Admitting: Gastroenterology

## 2023-04-02 NOTE — Progress Notes (Deleted)
 GI Office Note    Referring Provider: Lupita Raider, NP Primary Care Physician:  Lupita Raider, NP Primary Gastroenterologist: *** Date:  04/02/2023  ID:  Nathaniel Kline, DOB 10-05-1950, MRN [id number]   Chief Complaint   No chief complaint on file.   History of Present Illness  Nathaniel Kline is a 73 y.o. male with a history of HLD, HTN, and alcohol abuse presenting today with complaint of   Failed 5 attempts to schedule colonoscopy in 2016.   Last office visit 10/27/22. Having daily Bms, sometimes loose and sometimes firm. Stools are improved with increased veggie intake. Denied abdominal pain, melena, or brbpr. Rare heartburn. Scheduled for colonoscopy and advised follow up as needed.   Procedure was scheduled and cancelled twice by patient due to unknown reason and transportation issues.   Today:     Wt Readings from Last 3 Encounters:  11/24/22 269 lb (122 kg)  10/27/22 271 lb 3.2 oz (123 kg)  01/11/22 279 lb (126.6 kg)    Current Outpatient Medications  Medication Sig Dispense Refill   amLODipine (NORVASC) 10 MG tablet Take 1 tablet (10 mg total) by mouth daily. No more refills will be approved by this provider as office is now permanently closed, patient will need to find another primary care provider. 90 tablet 0   aspirin 81 MG EC tablet Take 81 mg by mouth daily.       Multiple Vitamin (MULTIVITAMIN) tablet Take 1 tablet by mouth daily.       RESTASIS MULTIDOSE 0.05 % ophthalmic emulsion 1 drop 2 (two) times daily.     rosuvastatin (CRESTOR) 5 MG tablet TAKE 1 TABLET (5 MG TOTAL) BY MOUTH DAILY. 90 tablet 3   silodosin (RAPAFLO) 8 MG CAPS capsule Take 1 capsule (8 mg total) by mouth at bedtime. (Patient not taking: Reported on 11/24/2022) 30 capsule 11   tiZANidine (ZANAFLEX) 2 MG tablet TAKE 1 TABLET (2 MG TOTAL) BY MOUTH 2 (TWO) TIMES DAILY AS NEEDED FOR MUSCLE SPASMS. (Patient not taking: Reported on 11/24/2022) 60 tablet 1   vitamin C (ASCORBIC ACID) 500 MG tablet  Take 500 mg by mouth daily.     No current facility-administered medications for this visit.    Past Medical History:  Diagnosis Date   Alcohol abuse    HLD (hyperlipidemia)    HTN (hypertension)     Past Surgical History:  Procedure Laterality Date   2 caths  10/21/05   caridac cath    Family History  Problem Relation Age of Onset   Diabetes Mother    High blood pressure Mother    High blood pressure Sister    High blood pressure Sister    High blood pressure Brother    Cancer Brother    Hypertension Other        family hx   Colon cancer Neg Hx    Colonic polyp Neg Hx     Allergies as of 04/03/2023 - Review Complete 11/24/2022  Allergen Reaction Noted   Other  05/10/2017    Social History   Socioeconomic History   Marital status: Single    Spouse name: Not on file   Number of children: 2   Years of education: Not on file   Highest education level: Not on file  Occupational History   Occupation: retired  Tobacco Use   Smoking status: Former    Current packs/day: 0.00    Average packs/day: 0.5 packs/day for 19.0 years (9.5 ttl pk-yrs)  Types: Cigarettes    Start date: 08/21/1974    Quit date: 08/20/1993    Years since quitting: 29.6   Smokeless tobacco: Never   Tobacco comments:    tobacco use - no  Vaping Use   Vaping status: Never Used  Substance and Sexual Activity   Alcohol use: Yes    Comment: 1/5th every couple of weeks. Drinks beer, wine, and occasionally liquor.   Drug use: No   Sexual activity: Yes    Partners: Female  Other Topics Concern   Not on file  Social History Narrative   Single.Disabled, does not get regular exercise.    Social Drivers of Corporate investment banker Strain: Not on file  Food Insecurity: Not on file  Transportation Needs: Not on file  Physical Activity: Not on file  Stress: Not on file  Social Connections: Not on file     Review of Systems   Gen: Denies fever, chills, anorexia. Denies fatigue, weakness,  weight loss.  CV: Denies chest pain, palpitations, syncope, peripheral edema, and claudication. Resp: Denies dyspnea at rest, cough, wheezing, coughing up blood, and pleurisy. GI: See HPI Derm: Denies rash, itching, dry skin Psych: Denies depression, anxiety, memory loss, confusion. No homicidal or suicidal ideation.  Heme: Denies bruising, bleeding, and enlarged lymph nodes.  Physical Exam   There were no vitals taken for this visit.  General:   Alert and oriented. No distress noted. Pleasant and cooperative.  Head:  Normocephalic and atraumatic. Eyes:  Conjuctiva clear without scleral icterus. Mouth:  Oral mucosa pink and moist. Good dentition. No lesions. Lungs:  Clear to auscultation bilaterally. No wheezes, rales, or rhonchi. No distress.  Heart:  S1, S2 present without murmurs appreciated.  Abdomen:  +BS, soft, non-tender and non-distended. No rebound or guarding. No HSM or masses noted. Rectal: *** Msk:  Symmetrical without gross deformities. Normal posture. Extremities:  Without edema. Neurologic:  Alert and  oriented x4 Psych:  Alert and cooperative. Normal mood and affect.  Assessment  Nathaniel Kline is a 73 y.o. male with a history of HLD, HTN, alcohol abuse, CAD, presenting today with ***  Screening for colon cancer:   PLAN   ***     Brooke Bonito, MSN, FNP-BC, AGACNP-BC [facility]

## 2023-04-03 ENCOUNTER — Telehealth: Payer: Self-pay | Admitting: Gastroenterology

## 2023-04-03 ENCOUNTER — Ambulatory Visit: Payer: Medicare PPO | Admitting: Gastroenterology

## 2023-04-03 NOTE — Telephone Encounter (Signed)
Called patient's sister to let her know that he was making appointments and then not showing up for them.  She said that I would need to talk to him and I explained that is why I was calling her because I don't think he is remembering that he has made the appointments or he will come in 3 hours late.  She said that she wasn't going to make an appointment for him he will just have to deal with it.

## 2023-04-10 DIAGNOSIS — R972 Elevated prostate specific antigen [PSA]: Secondary | ICD-10-CM | POA: Diagnosis not present

## 2023-04-10 DIAGNOSIS — R7301 Impaired fasting glucose: Secondary | ICD-10-CM | POA: Diagnosis not present

## 2023-04-10 DIAGNOSIS — I10 Essential (primary) hypertension: Secondary | ICD-10-CM | POA: Diagnosis not present

## 2023-04-10 DIAGNOSIS — N183 Chronic kidney disease, stage 3 unspecified: Secondary | ICD-10-CM | POA: Diagnosis not present

## 2023-04-10 DIAGNOSIS — E039 Hypothyroidism, unspecified: Secondary | ICD-10-CM | POA: Diagnosis not present

## 2023-04-25 ENCOUNTER — Ambulatory Visit: Payer: Medicare PPO | Admitting: Urology

## 2023-05-16 ENCOUNTER — Ambulatory Visit: Admitting: Urology

## 2023-05-16 VITALS — BP 117/77 | HR 88

## 2023-05-16 DIAGNOSIS — R972 Elevated prostate specific antigen [PSA]: Secondary | ICD-10-CM

## 2023-05-16 DIAGNOSIS — R351 Nocturia: Secondary | ICD-10-CM | POA: Diagnosis not present

## 2023-05-16 DIAGNOSIS — N401 Enlarged prostate with lower urinary tract symptoms: Secondary | ICD-10-CM | POA: Diagnosis not present

## 2023-05-16 DIAGNOSIS — N138 Other obstructive and reflux uropathy: Secondary | ICD-10-CM

## 2023-05-16 LAB — URINALYSIS, ROUTINE W REFLEX MICROSCOPIC
Bilirubin, UA: NEGATIVE
Glucose, UA: NEGATIVE
Nitrite, UA: POSITIVE — AB
Specific Gravity, UA: 1.03 (ref 1.005–1.030)
Urobilinogen, Ur: 1 mg/dL (ref 0.2–1.0)
pH, UA: 5.5 (ref 5.0–7.5)

## 2023-05-16 LAB — MICROSCOPIC EXAMINATION: RBC, Urine: >30 /HPF — AB (ref 0–2)

## 2023-05-16 MED ORDER — TAMSULOSIN HCL 0.4 MG PO CAPS
0.4000 mg | ORAL_CAPSULE | Freq: Every day | ORAL | 11 refills | Status: DC
Start: 2023-05-16 — End: 2023-12-12

## 2023-05-16 NOTE — Progress Notes (Signed)
 05/16/2023 1:17 PM   Nathaniel Kline 30-Oct-1950 [id number]  Referring provider: Lupita Raider, NP [address] Nathaniel Kline,  [place] [address]  Followup BPH.   HPI: Nathaniel Kline is a 72yo here for followup for elevated PSA and BPH with nocturia. Nocturia 4-5x per night depending on fluid consumption. IPSS 20 QOL 5. He has small volume voids at night. His urine stream is weak. No recent PSA   PMH: Past Medical History:  Diagnosis Date   Alcohol abuse    HLD (hyperlipidemia)    HTN (hypertension)     Surgical History: Past Surgical History:  Procedure Laterality Date   2 caths  10/21/05   caridac cath    Home Medications:  Allergies as of 05/16/2023       Reactions   Other    Peanut butter, carrots, celery        Medication List        Accurate as of May 16, 2023  1:17 PM. If you have any questions, ask your nurse or doctor.          amLODipine 10 MG tablet Commonly known as: NORVASC Take 1 tablet (10 mg total) by mouth daily. No more refills will be approved by this provider as office is now permanently closed, patient will need to find another primary care provider.   ascorbic acid 500 MG tablet Commonly known as: VITAMIN C Take 500 mg by mouth daily.   aspirin EC 81 MG tablet Take 81 mg by mouth daily.   multivitamin tablet Take 1 tablet by mouth daily.   Restasis MultiDose 0.05 % ophthalmic emulsion Generic drug: cycloSPORINE 1 drop 2 (two) times daily.   rosuvastatin 5 MG tablet Commonly known as: CRESTOR TAKE 1 TABLET (5 MG TOTAL) BY MOUTH DAILY.   silodosin 8 MG Caps capsule Commonly known as: RAPAFLO Take 1 capsule (8 mg total) by mouth at bedtime.   tiZANidine 2 MG tablet Commonly known as: ZANAFLEX TAKE 1 TABLET (2 MG TOTAL) BY MOUTH 2 (TWO) TIMES DAILY AS NEEDED FOR MUSCLE SPASMS.        Allergies:  Allergies  Allergen Reactions   Other     Peanut butter, carrots, celery     Family History: Family History  Problem Relation  Age of Onset   Diabetes Mother    High blood pressure Mother    High blood pressure Sister    High blood pressure Sister    High blood pressure Brother    Cancer Brother    Hypertension Other        family hx   Colon cancer Neg Hx    Colonic polyp Neg Hx     Social History:  reports that he quit smoking about 29 years ago. His smoking use included cigarettes. He started smoking about 48 years ago. He has a 9.5 pack-year smoking history. He has never used smokeless tobacco. He reports current alcohol use. He reports that he does not use drugs.  ROS: All other review of systems were reviewed and are negative except what is noted above in HPI  Physical Exam: BP 117/77   Pulse 88   Constitutional:  Alert and oriented, No acute distress. HEENT: [place] AT, moist mucus membranes.  Trachea midline, no masses. Cardiovascular: No clubbing, cyanosis, or edema. Respiratory: Normal respiratory effort, no increased work of breathing. GI: Abdomen is soft, nontender, nondistended, no abdominal masses GU: No CVA tenderness.  Lymph: No cervical or inguinal lymphadenopathy. Skin: No  rashes, bruises or suspicious lesions. Neurologic: Grossly intact, no focal deficits, moving all 4 extremities. Psychiatric: Normal mood and affect.  Laboratory Data: Lab Results  Component Value Date   WBC 8.0 08/04/2020   HGB 15.2 08/04/2020   HCT 45.0 08/04/2020   MCV 100.2 (H) 08/04/2020   PLT 229 08/04/2020    Lab Results  Component Value Date   CREATININE 1.47 (H) 08/04/2020    No results found for: "PSA"  No results found for: "TESTOSTERONE"  Lab Results  Component Value Date   HGBA1C 90 12/30/2016    Urinalysis    Component Value Date/Time   COLORURINE YELLOW 01/19/2008 2204   APPEARANCEUR Clear 04/21/2022 1117   LABSPEC <1.005 (L) 01/19/2008 2204   PHURINE 5.5 01/19/2008 2204   GLUCOSEU Negative 04/21/2022 1117   HGBUR NEGATIVE 01/19/2008 2204   BILIRUBINUR Negative 04/21/2022 1117    KETONESUR NEGATIVE 01/19/2008 2204   PROTEINUR 1+ (A) 04/21/2022 1117   PROTEINUR NEGATIVE 01/19/2008 2204   UROBILINOGEN 0.2 01/19/2008 2204   NITRITE Negative 04/21/2022 1117   NITRITE NEGATIVE 01/19/2008 2204   LEUKOCYTESUR Negative 04/21/2022 1117    Lab Results  Component Value Date   LABMICR See below: 04/21/2022   WBCUA 0-5 04/21/2022   LABEPIT 0-10 04/21/2022   MUCUS Present 09/29/2020   BACTERIA None seen 04/21/2022    Pertinent Imaging:  No results found for this or any previous visit.  No results found for this or any previous visit.  No results found for this or any previous visit.  No results found for this or any previous visit.  No results found for this or any previous visit.  No results found for this or any previous visit.  No results found for this or any previous visit.  No results found for this or any previous visit.   Assessment & Plan:    1. Elevated PSA (Primary) PSA today, will call with results - Urinalysis, Routine w reflex microscopic  2. Benign prostatic hyperplasia with urinary obstruction Flomax 0.4mg  daily   3. Nocturia Flomax 0.4mg  daily   No follow-ups on file.  Nathaniel Aye, MD  [facility] [place]

## 2023-05-17 ENCOUNTER — Other Ambulatory Visit: Payer: Self-pay | Admitting: Urology

## 2023-05-17 LAB — PSA, TOTAL AND FREE
PSA, Free Pct: 29.1 %
PSA, Free: 1.98 ng/mL
Prostate Specific Ag, Serum: 6.8 ng/mL — ABNORMAL HIGH (ref 0.0–4.0)

## 2023-05-18 LAB — URINE CULTURE

## 2023-05-19 ENCOUNTER — Encounter: Payer: Self-pay | Admitting: Urology

## 2023-05-19 NOTE — Patient Instructions (Signed)

## 2023-05-21 ENCOUNTER — Telehealth: Payer: Self-pay | Admitting: Urology

## 2023-05-21 NOTE — Telephone Encounter (Signed)
 Wants psa results

## 2023-05-21 NOTE — Telephone Encounter (Signed)
 FYI and advise please (PSA and urine culture are in)

## 2023-05-22 DIAGNOSIS — R809 Proteinuria, unspecified: Secondary | ICD-10-CM | POA: Diagnosis not present

## 2023-05-22 DIAGNOSIS — E039 Hypothyroidism, unspecified: Secondary | ICD-10-CM | POA: Diagnosis not present

## 2023-05-22 DIAGNOSIS — R7401 Elevation of levels of liver transaminase levels: Secondary | ICD-10-CM | POA: Diagnosis not present

## 2023-05-22 DIAGNOSIS — E785 Hyperlipidemia, unspecified: Secondary | ICD-10-CM | POA: Diagnosis not present

## 2023-05-22 DIAGNOSIS — R7981 Abnormal blood-gas level: Secondary | ICD-10-CM | POA: Diagnosis not present

## 2023-05-22 DIAGNOSIS — I129 Hypertensive chronic kidney disease with stage 1 through stage 4 chronic kidney disease, or unspecified chronic kidney disease: Secondary | ICD-10-CM | POA: Diagnosis not present

## 2023-05-22 DIAGNOSIS — I10 Essential (primary) hypertension: Secondary | ICD-10-CM | POA: Diagnosis not present

## 2023-05-22 DIAGNOSIS — N183 Chronic kidney disease, stage 3 unspecified: Secondary | ICD-10-CM | POA: Diagnosis not present

## 2023-05-22 DIAGNOSIS — F1010 Alcohol abuse, uncomplicated: Secondary | ICD-10-CM | POA: Diagnosis not present

## 2023-05-24 ENCOUNTER — Telehealth: Payer: Self-pay

## 2023-05-24 NOTE — Telephone Encounter (Signed)
 Pt called to get PSA results notified Pt that the MD needs to review the results then someone from our office will reach out w/ results

## 2023-06-09 ENCOUNTER — Other Ambulatory Visit: Payer: Self-pay

## 2023-06-09 ENCOUNTER — Emergency Department (HOSPITAL_COMMUNITY)
Admission: EM | Admit: 2023-06-09 | Discharge: 2023-06-09 | Disposition: A | Discharge status: Home / Self Care | Attending: Emergency Medicine | Admitting: Emergency Medicine

## 2023-06-09 ENCOUNTER — Encounter (HOSPITAL_COMMUNITY): Payer: Self-pay

## 2023-06-09 ENCOUNTER — Emergency Department (HOSPITAL_COMMUNITY)

## 2023-06-09 DIAGNOSIS — Z7982 Long term (current) use of aspirin: Secondary | ICD-10-CM | POA: Diagnosis not present

## 2023-06-09 DIAGNOSIS — M25511 Pain in right shoulder: Secondary | ICD-10-CM | POA: Insufficient documentation

## 2023-06-09 DIAGNOSIS — Z79899 Other long term (current) drug therapy: Secondary | ICD-10-CM | POA: Diagnosis not present

## 2023-06-09 DIAGNOSIS — I10 Essential (primary) hypertension: Secondary | ICD-10-CM | POA: Diagnosis not present

## 2023-06-09 DIAGNOSIS — M19011 Primary osteoarthritis, right shoulder: Secondary | ICD-10-CM | POA: Diagnosis not present

## 2023-06-09 MED ORDER — IBUPROFEN 800 MG PO TABS: 800.0000 mg | ORAL_TABLET | Freq: Three times a day (TID) | ORAL | 1 refills | Status: DC | PRN

## 2023-06-09 NOTE — ED Provider Notes (Signed)
 [place] EMERGENCY DEPARTMENT AT [facility] Provider Note   CSN: [id number] Arrival date & time: 06/09/23  9562     History  Chief Complaint  Patient presents with   Shoulder Pain    Nathaniel Kline is a 73 y.o. male.  Patient has a history of hypertension.  Patient complains of pain in his right shoulder.  No history of trauma  The history is provided by the patient and medical records. No language interpreter was used.  Shoulder Pain Location:  Shoulder Shoulder location:  R shoulder Injury: no   Pain details:    Quality:  Aching   Severity:  Moderate   Onset quality:  Sudden   Timing:  Constant   Progression:  Worsening Dislocation: no   Foreign body present:  No foreign bodies Associated symptoms: no back pain and no fatigue        Home Medications Prior to Admission medications   Medication Sig Start Date End Date Taking? Authorizing Provider  ibuprofen  (ADVIL ) 800 MG tablet Take 1 tablet (800 mg total) by mouth every 8 (eight) hours as needed. 06/09/23  Yes Cheyenne Cotta, MD  amLODipine  (NORVASC ) 10 MG tablet Take 1 tablet (10 mg total) by mouth daily. No more refills will be approved by this provider as office is now permanently closed, patient will need to find another primary care provider. 10/28/20   Zorita Hiss, NP  aspirin 81 MG EC tablet Take 81 mg by mouth daily.      [provider]  Multiple Vitamin (MULTIVITAMIN) tablet Take 1 tablet by mouth daily.      [provider]  RESTASIS MULTIDOSE 0.05 % ophthalmic emulsion 1 drop 2 (two) times daily. 08/09/22   [provider]  rosuvastatin  (CRESTOR ) 5 MG tablet TAKE 1 TABLET (5 MG TOTAL) BY MOUTH DAILY. 02/05/23 01/31/24  Laurann Pollock, MD  silodosin  (RAPAFLO ) 8 MG CAPS capsule TAKE 1 CAPSULE (8 MG TOTAL) BY MOUTH AT BEDTIME. 05/18/23   McKenzie, Arden Beck, MD  tamsulosin  (FLOMAX ) 0.4 MG CAPS capsule Take 1 capsule (0.4 mg total) by mouth daily after supper. 05/16/23    McKenzie, Arden Beck, MD  tiZANidine  (ZANAFLEX ) 2 MG tablet TAKE 1 TABLET (2 MG TOTAL) BY MOUTH 2 (TWO) TIMES DAILY AS NEEDED FOR MUSCLE SPASMS. Patient not taking: Reported on 11/24/2022 08/11/20   Gosrani, Nimish C, MD  vitamin C (ASCORBIC ACID) 500 MG tablet Take 500 mg by mouth daily.    [provider]      Allergies    Other    Review of Systems   Review of Systems  Constitutional:  Negative for appetite change and fatigue.  HENT:  Negative for congestion, ear discharge and sinus pressure.   Eyes:  Negative for discharge.  Respiratory:  Negative for cough.   Cardiovascular:  Negative for chest pain.  Gastrointestinal:  Negative for abdominal pain and diarrhea.  Genitourinary:  Negative for frequency and hematuria.  Musculoskeletal:  Negative for back pain.       Right shoulder pain  Skin:  Negative for rash.  Neurological:  Negative for seizures and headaches.  Psychiatric/Behavioral:  Negative for hallucinations.     Physical Exam Updated Vital Signs BP (!) 172/99   Pulse 79   Temp 97.7 F (36.5 C) (Oral)   Resp 20   Ht 6\' 6"  (1.981 m)   Wt 122 kg   SpO2 97%   BMI 31.08 kg/m  Physical Exam Vitals and nursing  note reviewed.  Constitutional:      Appearance: He is well-developed.  HENT:     Head: Normocephalic.  Eyes:     General: No scleral icterus.    Conjunctiva/sclera: Conjunctivae normal.  Neck:     Thyroid : No thyromegaly.  Cardiovascular:     Rate and Rhythm: Normal rate and regular rhythm.     Heart sounds: No murmur heard.    No friction rub. No gallop.  Pulmonary:     Breath sounds: No stridor. No wheezing or rales.  Chest:     Chest wall: No tenderness.  Abdominal:     General: There is no distension.     Tenderness: There is no abdominal tenderness. There is no rebound.  Musculoskeletal:     Cervical back: Neck supple.     Comments: Tender right shoulder  Lymphadenopathy:     Cervical: No cervical adenopathy.  Skin:    Findings:  No erythema or rash.  Neurological:     Mental Status: He is alert and oriented to person, place, and time.     Motor: No abnormal muscle tone.     Coordination: Coordination normal.  Psychiatric:        Behavior: Behavior normal.     ED Results / Procedures / Treatments   Labs (all labs ordered are listed, but only abnormal results are displayed) Labs Reviewed - No data to display  EKG None  Radiology DG Shoulder Right Result Date: 06/09/2023 CLINICAL DATA:  Pain. EXAM: RIGHT SHOULDER - 2+ VIEW COMPARISON:  03/27/19 FINDINGS: No signs of acute fracture or dislocation. Degenerative changes are identified involving the acromioclavicular joint with joint space narrowing and marginal spur formation. Mild spurring along the inferior aspect of the glenoid. Visualized right lung appears clear. No rib fractures identified. IMPRESSION: 1. No acute findings. 2. Acromioclavicular and glenohumeral joint osteoarthritis. Electronically Signed   By: Kimberley Penman M.D.   On: 06/09/2023 08:53    Procedures Procedures    Medications Ordered in ED Medications - No data to display  ED Course/ Medical Decision Making/ A&P                                 Medical Decision Making Amount and/or Complexity of Data Reviewed Radiology: ordered.  Risk Prescription drug management.  X-ray shows osteoarthritis at the right [facility] joint.  Patient put on Motrin  and will follow-up with PCP        Final Clinical Impression(s) / ED Diagnoses Final diagnoses:  Acute pain of right shoulder    Rx / DC Orders ED Discharge Orders          Ordered    ibuprofen  (ADVIL ) 800 MG tablet  Every 8 hours PRN        06/09/23 0938              Cheyenne Cotta, MD 06/12/23 0900

## 2023-06-09 NOTE — ED Triage Notes (Signed)
 Pt states he was trimming hedges 4 days ago and started having right shoulder pain afterwards, pain has not gotten worse. States pain is worse when he moves it a certain way.

## 2023-06-09 NOTE — Discharge Instructions (Signed)
 Follow-up with your family doctor in 1 to 2 weeks for recheck.  Return if any problem

## 2023-06-18 ENCOUNTER — Ambulatory Visit: Admitting: Urology

## 2023-09-28 DIAGNOSIS — R7301 Impaired fasting glucose: Secondary | ICD-10-CM | POA: Diagnosis not present

## 2023-09-28 DIAGNOSIS — N183 Chronic kidney disease, stage 3 unspecified: Secondary | ICD-10-CM | POA: Diagnosis not present

## 2023-09-28 DIAGNOSIS — I10 Essential (primary) hypertension: Secondary | ICD-10-CM | POA: Diagnosis not present

## 2023-09-28 DIAGNOSIS — R972 Elevated prostate specific antigen [PSA]: Secondary | ICD-10-CM | POA: Diagnosis not present

## 2023-09-28 DIAGNOSIS — E039 Hypothyroidism, unspecified: Secondary | ICD-10-CM | POA: Diagnosis not present

## 2023-10-08 ENCOUNTER — Other Ambulatory Visit (HOSPITAL_COMMUNITY): Payer: Self-pay | Admitting: Family Medicine

## 2023-10-08 DIAGNOSIS — I10 Essential (primary) hypertension: Secondary | ICD-10-CM | POA: Diagnosis not present

## 2023-10-08 DIAGNOSIS — R7401 Elevation of levels of liver transaminase levels: Secondary | ICD-10-CM

## 2023-10-08 DIAGNOSIS — E785 Hyperlipidemia, unspecified: Secondary | ICD-10-CM | POA: Diagnosis not present

## 2023-10-08 DIAGNOSIS — R809 Proteinuria, unspecified: Secondary | ICD-10-CM | POA: Diagnosis not present

## 2023-10-08 DIAGNOSIS — N183 Chronic kidney disease, stage 3 unspecified: Secondary | ICD-10-CM | POA: Diagnosis not present

## 2023-10-08 DIAGNOSIS — F1010 Alcohol abuse, uncomplicated: Secondary | ICD-10-CM | POA: Diagnosis not present

## 2023-10-08 DIAGNOSIS — R718 Other abnormality of red blood cells: Secondary | ICD-10-CM | POA: Diagnosis not present

## 2023-10-08 DIAGNOSIS — R7981 Abnormal blood-gas level: Secondary | ICD-10-CM | POA: Diagnosis not present

## 2023-10-08 DIAGNOSIS — R748 Abnormal levels of other serum enzymes: Secondary | ICD-10-CM

## 2023-10-08 DIAGNOSIS — N179 Acute kidney failure, unspecified: Secondary | ICD-10-CM | POA: Diagnosis not present

## 2023-10-08 DIAGNOSIS — E039 Hypothyroidism, unspecified: Secondary | ICD-10-CM | POA: Diagnosis not present

## 2023-10-17 ENCOUNTER — Ambulatory Visit (HOSPITAL_COMMUNITY)
Admission: RE | Admit: 2023-10-17 | Discharge: 2023-10-17 | Disposition: A | Discharge status: Home / Self Care | Source: Ambulatory Visit | Attending: Family Medicine | Admitting: Family Medicine

## 2023-10-17 DIAGNOSIS — R748 Abnormal levels of other serum enzymes: Secondary | ICD-10-CM | POA: Insufficient documentation

## 2023-10-17 DIAGNOSIS — R7401 Elevation of levels of liver transaminase levels: Secondary | ICD-10-CM | POA: Insufficient documentation

## 2023-10-17 DIAGNOSIS — N281 Cyst of kidney, acquired: Secondary | ICD-10-CM | POA: Diagnosis not present

## 2023-10-23 ENCOUNTER — Telehealth: Payer: Self-pay

## 2023-10-23 ENCOUNTER — Other Ambulatory Visit (HOSPITAL_COMMUNITY): Payer: Self-pay

## 2023-10-23 NOTE — Telephone Encounter (Signed)
 Pharmacy Patient Advocate Encounter  Insurance verification completed.   The patient is insured through [employer]   Ran test claim for [employer]. Currently a quantity of 84 is a 28 day supply and is Non-Formulary .  Patient will have to try Epclusa, Vosevi and medication will need a PA.  This test claim was processed through [facility]- copay amounts may vary at other pharmacies due to pharmacy/plan contracts, or as the patient moves through the different stages of their insurance plan.

## 2023-10-24 ENCOUNTER — Encounter: Payer: Self-pay | Admitting: Internal Medicine

## 2023-11-01 ENCOUNTER — Ambulatory Visit: Admitting: Infectious Diseases

## 2023-11-05 DIAGNOSIS — E039 Hypothyroidism, unspecified: Secondary | ICD-10-CM | POA: Diagnosis not present

## 2023-11-21 ENCOUNTER — Other Ambulatory Visit: Payer: Self-pay

## 2023-11-21 ENCOUNTER — Other Ambulatory Visit

## 2023-11-21 DIAGNOSIS — R972 Elevated prostate specific antigen [PSA]: Secondary | ICD-10-CM | POA: Diagnosis not present

## 2023-11-22 LAB — PSA, TOTAL AND FREE
PSA, Free Pct: 36.5 %
PSA, Free: 2.37 ng/mL
Prostate Specific Ag, Serum: 6.5 ng/mL — ABNORMAL HIGH (ref 0.0–4.0)

## 2023-11-28 ENCOUNTER — Ambulatory Visit: Admitting: Urology

## 2023-11-28 ENCOUNTER — Encounter: Payer: Self-pay | Admitting: Urology

## 2023-11-28 VITALS — BP 134/83 | HR 81

## 2023-11-28 DIAGNOSIS — N138 Other obstructive and reflux uropathy: Secondary | ICD-10-CM | POA: Diagnosis not present

## 2023-11-28 DIAGNOSIS — R351 Nocturia: Secondary | ICD-10-CM | POA: Diagnosis not present

## 2023-11-28 DIAGNOSIS — R972 Elevated prostate specific antigen [PSA]: Secondary | ICD-10-CM | POA: Diagnosis not present

## 2023-11-28 DIAGNOSIS — N401 Enlarged prostate with lower urinary tract symptoms: Secondary | ICD-10-CM | POA: Diagnosis not present

## 2023-11-28 LAB — URINALYSIS, ROUTINE W REFLEX MICROSCOPIC
Bilirubin, UA: NEGATIVE
Glucose, UA: NEGATIVE
Nitrite, UA: NEGATIVE
RBC, UA: NEGATIVE
Specific Gravity, UA: 1.02 (ref 1.005–1.030)
Urobilinogen, Ur: 1 mg/dL (ref 0.2–1.0)
pH, UA: 6 (ref 5.0–7.5)

## 2023-11-28 LAB — MICROSCOPIC EXAMINATION: Bacteria, UA: NONE SEEN

## 2023-11-28 NOTE — Patient Instructions (Signed)

## 2023-11-28 NOTE — Progress Notes (Signed)
 11/28/2023 2:22 PM   Nathaniel Kline [birth year] [id number]  Referring provider: Hyacinth Honey, NP [address] [place],  [place] [address]  Elevated PSA   HPI: Nathaniel Kline is a 73yo here for followup for elevated PSA and BPH. PSA 6.5 down from 6.8. PSA was as high as 8 three years ago. IPSS 14 QOL 3 on no BPh medication. He has nocturia 3-4x depending on fluid consumption.  Urine stream mostly strong. No straining to urinate. He drinks 40oz of water within 2 hours of going to bed.    PMH: Past Medical History:  Diagnosis Date   Alcohol abuse    HLD (hyperlipidemia)    HTN (hypertension)     Surgical History: Past Surgical History:  Procedure Laterality Date   2 caths  10/21/05   caridac cath    Home Medications:  Allergies as of 11/28/2023       Reactions   Other    Peanut butter, carrots, celery        Medication List        Accurate as of November 28, 2023  2:22 PM. If you have any questions, ask your nurse or doctor.          amLODipine  10 MG tablet Commonly known as: NORVASC  Take 1 tablet (10 mg total) by mouth daily. No more refills will be approved by this provider as office is now permanently closed, patient will need to find another primary care provider.   ascorbic acid 500 MG tablet Commonly known as: VITAMIN C Take 500 mg by mouth daily.   aspirin EC 81 MG tablet Take 81 mg by mouth daily.   ibuprofen  800 MG tablet Commonly known as: ADVIL  Take 1 tablet (800 mg total) by mouth every 8 (eight) hours as needed.   multivitamin tablet Take 1 tablet by mouth daily.   Restasis MultiDose 0.05 % ophthalmic emulsion Generic drug: cycloSPORINE 1 drop 2 (two) times daily.   rosuvastatin  5 MG tablet Commonly known as: CRESTOR  TAKE 1 TABLET (5 MG TOTAL) BY MOUTH DAILY.   silodosin  8 MG Caps capsule Commonly known as: RAPAFLO  TAKE 1 CAPSULE (8 MG TOTAL) BY MOUTH AT BEDTIME.   tamsulosin  0.4 MG Caps capsule Commonly known as: FLOMAX  Take 1  capsule (0.4 mg total) by mouth daily after supper.   tiZANidine  2 MG tablet Commonly known as: ZANAFLEX  TAKE 1 TABLET (2 MG TOTAL) BY MOUTH 2 (TWO) TIMES DAILY AS NEEDED FOR MUSCLE SPASMS.        Allergies:  Allergies  Allergen Reactions   Other     Peanut butter, carrots, celery     Family History: Family History  Problem Relation Age of Onset   Diabetes Mother    High blood pressure Mother    High blood pressure Sister    High blood pressure Sister    High blood pressure Brother    Cancer Brother    Hypertension Other        family hx   Colon cancer Neg Hx    Colonic polyp Neg Hx     Social History:  reports that he quit smoking about 30 years ago. His smoking use included cigarettes. He started smoking about 49 years ago. He has a 9.5 pack-year smoking history. He has never used smokeless tobacco. He reports current alcohol use. He reports that he does not use drugs.  ROS: All other review of systems were reviewed and are negative except what is noted above  in HPI  Physical Exam: BP 134/83   Pulse 81   Constitutional:  Alert and oriented, No acute distress. HEENT: [place] AT, moist mucus membranes.  Trachea midline, no masses. Cardiovascular: No clubbing, cyanosis, or edema. Respiratory: Normal respiratory effort, no increased work of breathing. GI: Abdomen is soft, nontender, nondistended, no abdominal masses GU: No CVA tenderness.  Lymph: No cervical or inguinal lymphadenopathy. Skin: No rashes, bruises or suspicious lesions. Neurologic: Grossly intact, no focal deficits, moving all 4 extremities. Psychiatric: Normal mood and affect.  Laboratory Data: Lab Results  Component Value Date   WBC 8.0 08/04/2020   HGB 15.2 08/04/2020   HCT 45.0 08/04/2020   MCV 100.2 (H) 08/04/2020   PLT 229 08/04/2020    Lab Results  Component Value Date   CREATININE 1.47 (H) 08/04/2020    No results found for: PSA  No results found for: TESTOSTERONE  Lab Results   Component Value Date   HGBA1C 90 12/30/2016    Urinalysis    Component Value Date/Time   COLORURINE YELLOW 01/19/2008 2204   APPEARANCEUR Clear 05/16/2023 1322   LABSPEC <1.005 (L) 01/19/2008 2204   PHURINE 5.5 01/19/2008 2204   GLUCOSEU Negative 05/16/2023 1322   HGBUR NEGATIVE 01/19/2008 2204   BILIRUBINUR Negative 05/16/2023 1322   KETONESUR NEGATIVE 01/19/2008 2204   PROTEINUR 2+ (A) 05/16/2023 1322   PROTEINUR NEGATIVE 01/19/2008 2204   UROBILINOGEN 0.2 01/19/2008 2204   NITRITE Positive (A) 05/16/2023 1322   NITRITE NEGATIVE 01/19/2008 2204   LEUKOCYTESUR Trace (A) 05/16/2023 1322    Lab Results  Component Value Date   LABMICR See below: 05/16/2023   WBCUA 6-10 (A) 05/16/2023   LABEPIT 0-10 05/16/2023   MUCUS Present 09/29/2020   BACTERIA Many (A) 05/16/2023    Pertinent Imaging:  No results found for this or any previous visit.  No results found for this or any previous visit.  No results found for this or any previous visit.  No results found for this or any previous visit.  No results found for this or any previous visit.  No results found for this or any previous visit.  No results found for this or any previous visit.  No results found for this or any previous visit.   Assessment & Plan:    1. Elevated PSA (Primary) -IsoPSA, will; call with results  2. Benign prostatic hyperplasia with urinary obstruction -patient defers therapy at this time  3. Nocturia -decrease fluid intake within 2-3 hours of going to bed   No follow-ups on file.  Belvie Clara, MD  [facility] [place]

## 2023-11-28 NOTE — Addendum Note (Signed)
 Addended by: MALACHY SLICE on: 11/28/2023 02:48 PM   Modules accepted: Orders

## 2023-11-29 ENCOUNTER — Ambulatory Visit: Admitting: Internal Medicine

## 2023-11-29 ENCOUNTER — Encounter: Admitting: Family

## 2023-11-29 ENCOUNTER — Other Ambulatory Visit: Payer: Self-pay

## 2023-11-29 ENCOUNTER — Encounter: Payer: Self-pay | Admitting: Internal Medicine

## 2023-11-29 VITALS — BP 148/81 | HR 85 | Temp 98.3°F | Resp 16 | Wt 252.0 lb

## 2023-11-29 DIAGNOSIS — B182 Chronic viral hepatitis C: Secondary | ICD-10-CM

## 2023-11-29 NOTE — Progress Notes (Signed)
 [facility]                                      [address], [place], [place], [address]                                               Phn. [phone]; Fax: [phone]                                                               Date:  Reason for Visit: Hepatitis C    HPI: Nathaniel Kline is a 73 y.o.old male with HCV. Pt states he was tested at PCP as part of regular screening about 2 weeks ago. Pt notes that he gave blood for red cross 15-20 years ago and was given a letter he had  hepatitis. Pt has not donated since. Pt drinks hard liqour, reduced intake past 2 weeks  Denies h/o injectable or intranasal cocaine use, blood transfusion, sharing of toothbrushes/razors, or sexual contact with known positive partners, incarceration or Financial planner.  No personal or family history of liver disease, Hepatitis or Liver cancer.  He has not received treatment to date   Denies any hospitalizations related to liver disease, jaundice, ascites, GI bleeding, mental status changes, abdominal pain and acholic stool.   ROS: Denies yellowish discoloration of sclera and skin, abdominal pain/distension, hematemesis.  Denis cough, fever, chills, nightsweats, nausea, vomiting, diarrhea, constipation, weight loss, recent hospitalizations, rashes, joint complaints, shortness of breath, chest pain, headaches, dysuria .   Current Outpatient Medications on File Prior to Visit  Medication Sig Dispense Refill   amLODipine  (NORVASC ) 10 MG tablet Take 1 tablet (10 mg total) by mouth daily. No more refills will be approved by this provider as office is now permanently closed, patient will need to find another primary care provider. 90 tablet 0   aspirin 81 MG EC tablet Take 81 mg by mouth daily.       ibuprofen  (ADVIL ) 800 MG tablet Take 1 tablet (800 mg total) by mouth every 8 (eight) hours as needed. 21 tablet 1   Multiple Vitamin  (MULTIVITAMIN) tablet Take 1 tablet by mouth daily.       RESTASIS MULTIDOSE 0.05 % ophthalmic emulsion 1 drop 2 (two) times daily.     tiZANidine  (ZANAFLEX ) 2 MG tablet TAKE 1 TABLET (2 MG TOTAL) BY MOUTH 2 (TWO) TIMES DAILY AS NEEDED FOR MUSCLE SPASMS. 60 tablet 1   vitamin C (ASCORBIC ACID) 500 MG tablet Take 500 mg by mouth daily.     rosuvastatin  (CRESTOR ) 5 MG tablet TAKE 1 TABLET (5 MG TOTAL) BY MOUTH DAILY. (Patient not taking: Reported on 11/29/2023) 90 tablet 3   silodosin  (RAPAFLO ) 8 MG CAPS capsule TAKE 1 CAPSULE (8 MG TOTAL) BY MOUTH AT BEDTIME. (Patient not taking: Reported on 11/29/2023) 90 capsule 3   tamsulosin  (FLOMAX ) 0.4 MG CAPS capsule Take 1 capsule (0.4 mg total) by mouth daily after supper. (Patient not taking: Reported on 11/29/2023) 30 capsule 11   No current facility-administered medications  on file prior to visit.   Allergies  Allergen Reactions   Other     Peanut butter, carrots, celery     Past Medical History:  Diagnosis Date   Alcohol abuse    HLD (hyperlipidemia)    HTN (hypertension)     Past Surgical History:  Procedure Laterality Date   2 caths  10/21/05   caridac cath    Social History   Socioeconomic History   Marital status: Single    Spouse name: Not on file   Number of children: 2   Years of education: Not on file   Highest education level: Not on file  Occupational History   Occupation: retired  Tobacco Use   Smoking status: Former    Current packs/day: 0.00    Average packs/day: 0.5 packs/day for 19.0 years (9.5 ttl pk-yrs)    Types: Cigarettes    Start date: 08/21/1974    Quit date: 08/20/1993    Years since quitting: 30.2   Smokeless tobacco: Never   Tobacco comments:    tobacco use - no  Vaping Use   Vaping status: Never Used  Substance and Sexual Activity   Alcohol use: Yes    Comment: 1/5th every couple of weeks. Drinks beer, wine, and occasionally liquor.   Drug use: No   Sexual activity: Yes    Partners: Female  Other  Topics Concern   Not on file  Social History Narrative   Single.Disabled, does not get regular exercise.    Social Drivers of Corporate investment banker Strain: Not on file  Food Insecurity: Not on file  Transportation Needs: Not on file  Physical Activity: Not on file  Stress: Not on file  Social Connections: Not on file  Intimate Partner Violence: Not on file    Family History  Problem Relation Age of Onset   Diabetes Mother    High blood pressure Mother    High blood pressure Sister    High blood pressure Sister    High blood pressure Brother    Cancer Brother    Hypertension Other        family hx   Colon cancer Neg Hx    Colonic polyp Neg Hx     Physical exam: Wt 252 lb (114.3 kg)   BMI 29.12 kg/m   Gen: Alert and oriented x 3, no acute distress HEENT: [place]/AT, PERL, EOMI, no scleral icterus, no pale conjunctivae, hearing normal, oral mucosa moist Neck: Supple, no lymphadenopathy Cardio: Regular rate and rhythm; +S1 and S2; no murmurs, gallops, or rubs Resp: CTAB; no wheezes, rhonchi, or rales GI: Soft, nontender, nondistended, bowel sounds present GU: Musc: Extremities: No cyanosis, clubbing, or edema; +2 PT and DP pulses Skin: No rashes, lesions, or ecchymoses Neuro: No focal deficits Psych: Calm, cooperative   Assessment/Plan:  #Hepatitis C -would  like to get all labs today, unlclear if cleared hcv as he lives in [place] Prior treatment: none GT: Evidence of cirrhosis:  Interested in treatment: Potential DDI Plan: Labs and U/S CBC w diff CMP PT/PTT/INR Hep A and B serologies  HIV HCV genotype HCV RNA Measure of fibrosis NS5A resistance testing in select scenarios F/U with pharmacy to start medication, myself at the end of treatment  #Substance use? Smoking Alcohol Illicit substance use    #Counseling done on the following: -Natural progression of hep c, transmission (avoid sharing personal hygiene equipment), prevention, risks of  left untreated and treatment options  -Avoid hepatotoxins like alcohol and  excessive acetamaminphen (no more than 2 gram a day) -Avoid eating raw sea food -Risks of re-infection  -Hepatitis coinfection and vaccination( Pneumococcal vaccination in the cirrhotics   #If indicated in the setting of cirrhosis: - HCC screening with US  every 6 months - EGD to r/o varices in cirrhotics    Electronically signed by:  Loney Stank, MD Infectious Diseases  Fax no. [phone] I have personally spent 65 minutes involved in face-to-face and non-face-to-face activities for this patient on the day of the visit. Professional time spent includes the following activities: Preparing to see the patient (review of tests), Obtaining and/or reviewing separately obtained history (admission/discharge record), Performing a medically appropriate examination and/or evaluation , Ordering medications/tests/procedures, referring and communicating with other health care professionals, Documenting clinical information in the EMR, Independently interpreting results (not separately reported), Communicating results to the patient/family/caregiver, Counseling and educating the patient/family/caregiver and Care coordination (not separately reported).

## 2023-12-05 LAB — LIVER FIBROSIS, FIBROTEST-ACTITEST
ALT: 60 U/L — ABNORMAL HIGH (ref 9–46)
Alpha-2-Macroglobulin: 350 mg/dL — ABNORMAL HIGH (ref 106–279)
Apolipoprotein A1: 136 mg/dL (ref 94–176)
Bilirubin: 0.8 mg/dL (ref 0.2–1.2)
Fibrosis Score: 0.78
GGT: 120 U/L — ABNORMAL HIGH (ref 3–70)
Haptoglobin: 201 mg/dL (ref 43–212)
Necroinflammat ACT Score: 0.54
Reference ID: 5736008

## 2023-12-05 LAB — HEPATITIS B SURFACE ANTIGEN: Hepatitis B Surface Ag: NONREACTIVE

## 2023-12-05 LAB — CBC
HCT: 43 % (ref 38.5–50.0)
Hemoglobin: 14.2 g/dL (ref 13.2–17.1)
MCH: 34.1 pg — ABNORMAL HIGH (ref 27.0–33.0)
MCHC: 33 g/dL (ref 32.0–36.0)
MCV: 103.4 fL — ABNORMAL HIGH (ref 80.0–100.0)
MPV: 11.1 fL (ref 7.5–12.5)
Platelets: 229 Thousand/uL (ref 140–400)
RBC: 4.16 Million/uL — ABNORMAL LOW (ref 4.20–5.80)
RDW: 13 % (ref 11.0–15.0)
WBC: 8.7 Thousand/uL (ref 3.8–10.8)

## 2023-12-05 LAB — HIV ANTIBODY (ROUTINE TESTING W REFLEX)
HIV 1&2 Ab, 4th Generation: NONREACTIVE
HIV FINAL INTERPRETATION: NEGATIVE

## 2023-12-05 LAB — COMPLETE METABOLIC PANEL WITHOUT GFR
AG Ratio: 1.3 (calc) (ref 1.0–2.5)
ALT: 63 U/L — ABNORMAL HIGH (ref 9–46)
AST: 55 U/L — ABNORMAL HIGH (ref 10–35)
Albumin: 4.4 g/dL (ref 3.6–5.1)
Alkaline phosphatase (APISO): 54 U/L (ref 35–144)
BUN/Creatinine Ratio: 20 (calc) (ref 6–22)
BUN: 29 mg/dL — ABNORMAL HIGH (ref 7–25)
CO2: 27 mmol/L (ref 20–32)
Calcium: 9.2 mg/dL (ref 8.6–10.3)
Chloride: 100 mmol/L (ref 98–110)
Creat: 1.48 mg/dL — ABNORMAL HIGH (ref 0.70–1.28)
Globulin: 3.5 g/dL (ref 1.9–3.7)
Glucose, Bld: 89 mg/dL (ref 65–99)
Potassium: 4.2 mmol/L (ref 3.5–5.3)
Sodium: 137 mmol/L (ref 135–146)
Total Bilirubin: 0.8 mg/dL (ref 0.2–1.2)
Total Protein: 7.9 g/dL (ref 6.1–8.1)

## 2023-12-05 LAB — PROTIME-INR
INR: 1
Prothrombin Time: 10.9 s (ref 9.0–11.5)

## 2023-12-05 LAB — HEPATITIS C GENOTYPE

## 2023-12-05 LAB — HEPATITIS B SURFACE ANTIBODY,QUALITATIVE: Hep B S Ab: NONREACTIVE

## 2023-12-05 LAB — HEPATITIS C RNA QUANTITATIVE
HCV Quantitative Log: 4.49 {Log_IU}/mL — ABNORMAL HIGH
HCV RNA, PCR, QN: 30800 [IU]/mL — ABNORMAL HIGH

## 2023-12-05 LAB — HEPATITIS B CORE ANTIBODY, TOTAL: Hep B Core Total Ab: NONREACTIVE

## 2023-12-05 LAB — RPR: RPR Ser Ql: NONREACTIVE

## 2023-12-05 LAB — HEPATITIS A ANTIBODY, TOTAL: Hepatitis A AB,Total: NONREACTIVE

## 2023-12-05 LAB — HEPATITIS C ANTIBODY: Hepatitis C Ab: REACTIVE — AB

## 2023-12-06 ENCOUNTER — Ambulatory Visit: Admitting: Internal Medicine

## 2023-12-07 ENCOUNTER — Ambulatory Visit (HOSPITAL_COMMUNITY)

## 2023-12-11 ENCOUNTER — Ambulatory Visit: Payer: Self-pay | Admitting: Urology

## 2023-12-12 ENCOUNTER — Encounter: Payer: Self-pay | Admitting: Internal Medicine

## 2023-12-12 ENCOUNTER — Ambulatory Visit (INDEPENDENT_AMBULATORY_CARE_PROVIDER_SITE_OTHER): Admitting: Internal Medicine

## 2023-12-12 VITALS — BP 133/72 | HR 84 | Temp 97.5°F | Ht 78.0 in | Wt 257.0 lb

## 2023-12-12 DIAGNOSIS — K7460 Unspecified cirrhosis of liver: Secondary | ICD-10-CM

## 2023-12-12 DIAGNOSIS — B182 Chronic viral hepatitis C: Secondary | ICD-10-CM | POA: Diagnosis not present

## 2023-12-12 DIAGNOSIS — Z1211 Encounter for screening for malignant neoplasm of colon: Secondary | ICD-10-CM

## 2023-12-12 NOTE — Patient Instructions (Signed)
 I will order Cologuard testing for colon cancer screening purposes.  It is important that you get your hepatitis C treated.  Follow-up in 6 months or sooner if needed.  It was very nice seeing you today.  Dr. Cindie

## 2023-12-12 NOTE — Progress Notes (Unsigned)
 Primary Care Physician:  Hyacinth Honey, NP Primary Gastroenterologist:  Dr. Cindie  Chief Complaint  Patient presents with  . New Patient (Initial Visit)    Patient here today as a new patient, due to having Hep C. Patient denies any current gi related issues.    HPI:   Nathaniel Kline is a 73 y.o. male who presents   Past Medical History:  Diagnosis Date  . Alcohol abuse   . HLD (hyperlipidemia)   . HTN (hypertension)     Past Surgical History:  Procedure Laterality Date  . 2 caths  10/21/05   caridac cath    Current Outpatient Medications  Medication Sig Dispense Refill  . amLODipine  (NORVASC ) 10 MG tablet Take 1 tablet (10 mg total) by mouth daily. No more refills will be approved by this provider as office is now permanently closed, patient will need to find another primary care provider. 90 tablet 0  . aspirin 81 MG EC tablet Take 81 mg by mouth daily.      . diphenhydrAMINE (BENADRYL) 25 mg capsule Take 25 mg by mouth every 6 (six) hours as needed.    SABRA levothyroxine (SYNTHROID) 25 MCG tablet Take 25 mcg by mouth daily.    . Multiple Vitamin (MULTIVITAMIN) tablet Take 1 tablet by mouth daily.      . silodosin  (RAPAFLO ) 8 MG CAPS capsule TAKE 1 CAPSULE (8 MG TOTAL) BY MOUTH AT BEDTIME. (Patient taking differently: Take 8 mg by mouth as needed.) 90 capsule 3  . tiZANidine  (ZANAFLEX ) 2 MG tablet TAKE 1 TABLET (2 MG TOTAL) BY MOUTH 2 (TWO) TIMES DAILY AS NEEDED FOR MUSCLE SPASMS. 60 tablet 1  . vitamin C (ASCORBIC ACID) 500 MG tablet Take 500 mg by mouth daily. (Patient taking differently: Take 500 mg by mouth as needed.)     No current facility-administered medications for this visit.    Allergies as of 12/12/2023 - Review Complete 12/12/2023  Allergen Reaction Noted  . Other  05/10/2017    Family History  Problem Relation Age of Onset  . Diabetes Mother   . High blood pressure Mother   . High blood pressure Sister   . High blood pressure Sister   . High blood  pressure Brother   . Cancer Brother   . Hypertension Other        family hx  . Colon cancer Neg Hx   . Colonic polyp Neg Hx     Social History   Socioeconomic History  . Marital status: Single    Spouse name: Not on file  . Number of children: 2  . Years of education: Not on file  . Highest education level: Not on file  Occupational History  . Occupation: retired  Tobacco Use  . Smoking status: Former    Current packs/day: 0.00    Average packs/day: 0.5 packs/day for 19.0 years (9.5 ttl pk-yrs)    Types: Cigarettes    Start date: 08/21/1974    Quit date: 08/20/1993    Years since quitting: 30.3  . Smokeless tobacco: Never  . Tobacco comments:    tobacco use - no  Vaping Use  . Vaping status: Never Used  Substance and Sexual Activity  . Alcohol use: Yes    Comment: 1/5th every couple of weeks. Drinks beer, wine, and occasionally liquor.  . Drug use: No  . Sexual activity: Yes    Partners: Female  Other Topics Concern  . Not on file  Social History  Narrative   Single.Disabled, does not get regular exercise.    Social Drivers of Corporate investment banker Strain: Not on file  Food Insecurity: Not on file  Transportation Needs: Not on file  Physical Activity: Not on file  Stress: Not on file  Social Connections: Not on file  Intimate Partner Violence: Not on file    Subjective: Review of Systems  Constitutional:  Negative for chills and fever.  HENT:  Negative for congestion and hearing loss.   Eyes:  Negative for blurred vision and double vision.  Respiratory:  Negative for cough and shortness of breath.   Cardiovascular:  Negative for chest pain and palpitations.  Gastrointestinal:  Negative for abdominal pain, blood in stool, constipation, diarrhea, heartburn, melena and vomiting.  Genitourinary:  Negative for dysuria and urgency.  Musculoskeletal:  Negative for joint pain and myalgias.  Skin:  Negative for itching and rash.  Neurological:  Negative for  dizziness and headaches.  Psychiatric/Behavioral:  Negative for depression. The patient is not nervous/anxious.        Objective: BP 133/72 (BP Location: Left Arm, Patient Position: Sitting, Cuff Size: Large)   Pulse 84   Temp (!) 97.5 F (36.4 C) (Temporal)   Ht 6' 6 (1.981 m)   Wt 257 lb (116.6 kg)   BMI 29.70 kg/m  Physical Exam Constitutional:      Appearance: Normal appearance.  HENT:     Head: Normocephalic and atraumatic.  Eyes:     Extraocular Movements: Extraocular movements intact.     Conjunctiva/sclera: Conjunctivae normal.  Cardiovascular:     Rate and Rhythm: Normal rate and regular rhythm.  Pulmonary:     Effort: Pulmonary effort is normal.     Breath sounds: Normal breath sounds.  Abdominal:     General: Bowel sounds are normal.     Palpations: Abdomen is soft.  Musculoskeletal:        General: Normal range of motion.     Cervical back: Normal range of motion and neck supple.  Skin:    General: Skin is warm.  Neurological:     General: No focal deficit present.     Mental Status: He is alert and oriented to person, place, and time.  Psychiatric:        Mood and Affect: Mood normal.        Behavior: Behavior normal.      Assessment/Plan:  1.  Cirrhosis-well compensated, new diagnosis.  Likely combination of hepatitis C and prior significant alcohol use. MELD 3.0 of 10 (largely driven by elevated Cr)   12/12/2023 3:44 PM

## 2023-12-12 NOTE — Progress Notes (Signed)
 Letter sent

## 2023-12-14 ENCOUNTER — Telehealth: Payer: Self-pay

## 2023-12-14 NOTE — Telephone Encounter (Signed)
 Patient LVM wanting to know when he can start medication for Hep C. If someone could reach out to him please. Nathaniel Kline Nathaniel Kline, CMA

## 2023-12-14 NOTE — Telephone Encounter (Signed)
 He has imaging scheduled on 10/29. Once Dr. Dennise receives those results, she will select best treatment and start insurance investigation. Thanks!

## 2023-12-14 NOTE — Telephone Encounter (Signed)
 Patient advised.

## 2023-12-19 ENCOUNTER — Ambulatory Visit (HOSPITAL_COMMUNITY)

## 2023-12-28 ENCOUNTER — Ambulatory Visit (HOSPITAL_COMMUNITY): Admission: RE | Admit: 2023-12-28 | Source: Ambulatory Visit

## 2024-01-04 ENCOUNTER — Ambulatory Visit (HOSPITAL_COMMUNITY)
Admission: RE | Admit: 2024-01-04 | Discharge: 2024-01-04 | Disposition: A | Discharge status: Home / Self Care | Source: Ambulatory Visit | Attending: Internal Medicine | Admitting: Internal Medicine

## 2024-01-04 DIAGNOSIS — B182 Chronic viral hepatitis C: Secondary | ICD-10-CM | POA: Diagnosis not present

## 2024-01-09 NOTE — Telephone Encounter (Signed)
 Nathaniel Kline, could run [facility] x 8weeks

## 2024-01-10 ENCOUNTER — Other Ambulatory Visit (HOSPITAL_COMMUNITY): Payer: Self-pay

## 2024-01-11 DIAGNOSIS — N183 Chronic kidney disease, stage 3 unspecified: Secondary | ICD-10-CM | POA: Diagnosis not present

## 2024-01-11 DIAGNOSIS — R972 Elevated prostate specific antigen [PSA]: Secondary | ICD-10-CM | POA: Diagnosis not present

## 2024-01-11 DIAGNOSIS — E039 Hypothyroidism, unspecified: Secondary | ICD-10-CM | POA: Diagnosis not present

## 2024-01-20 ENCOUNTER — Ambulatory Visit: Payer: Self-pay | Admitting: Internal Medicine

## 2024-01-21 ENCOUNTER — Other Ambulatory Visit (HOSPITAL_COMMUNITY): Payer: Self-pay

## 2024-01-22 ENCOUNTER — Other Ambulatory Visit (HOSPITAL_COMMUNITY): Payer: Self-pay

## 2024-01-23 ENCOUNTER — Telehealth: Payer: Self-pay

## 2024-01-23 ENCOUNTER — Other Ambulatory Visit (HOSPITAL_COMMUNITY): Payer: Self-pay

## 2024-01-23 DIAGNOSIS — B182 Chronic viral hepatitis C: Secondary | ICD-10-CM

## 2024-01-23 MED ORDER — EPCLUSA 400-100 MG PO TABS
1.0000 | ORAL_TABLET | Freq: Every day | ORAL | 2 refills | Status: AC
  Filled 2024-01-25: qty 28, 28d supply, fill #0
  Filled 2024-02-18: qty 28, 28d supply, fill #1
  Filled 2024-03-18: qty 28, 28d supply, fill #2

## 2024-01-23 NOTE — Addendum Note (Signed)
 Addended by: WADDELL ALAN PARAS on: 01/23/2024 04:19 PM   Modules accepted: Orders

## 2024-01-23 NOTE — Telephone Encounter (Signed)
 Received notification from [employer] regarding a prior authorization for Epclusa Brand Name. Authorization has been APPROVED from 01/23/24 to 04/16/2024 approved for 12 weeks   Per test claim, copay for 28 days supply is $12.15  Patient can fill through [facility]: [phone]   Authorization # [id number]

## 2024-01-25 ENCOUNTER — Other Ambulatory Visit: Payer: Self-pay | Admitting: Pharmacist

## 2024-01-25 ENCOUNTER — Telehealth: Payer: Self-pay | Admitting: Pharmacist

## 2024-01-25 ENCOUNTER — Other Ambulatory Visit: Payer: Self-pay

## 2024-01-25 ENCOUNTER — Other Ambulatory Visit (HOSPITAL_COMMUNITY): Payer: Self-pay

## 2024-01-25 NOTE — Progress Notes (Signed)
 Specialty Pharmacy Initiation Note   Nathaniel Kline is a 73 y.o. male who will be followed by the specialty pharmacy service for RxSp Hepatitis C    Review of administration, indication, effectiveness, safety, potential side effects, storage/disposable, and missed dose instructions occurred today for patient's specialty medication(s) Sofosbuvir -Velpatasvir  (Epclusa )     Patient/Caregiver did not have any additional questions or concerns.   Patient's therapy is appropriate to: Initiate    Goals Addressed             This Visit's Progress    Achieve virologic cure as evidenced by SVR       Patient is initiating therapy. Patient will be evaluated at upcoming provider appointment to assess progress      Comply with lab assessments       Patient is on track. Patient will adhere to provider and/or lab appointments      Maintain optimal adherence to therapy       Patient is initiating therapy. Patient will be evaluated at upcoming provider appointment to assess progress         Alan JINNY Geralds Specialty Pharmacist

## 2024-01-25 NOTE — Telephone Encounter (Signed)
 Patient is approved to receive Epclusa  x 12 weeks for chronic Hepatitis C infection. Counseled patient to take Epclusa  daily with or without food. Encouraged patient not to miss any doses and explained how their chance of cure could go down with each dose missed. Counseled patient on what to do if dose is missed - if it is closer to the missed dose take immediately; if closer to next dose skip dose and take the next dose at the usual time. Counseled patient on common side effects such as headache, fatigue, and nausea and that these normally decrease with time.   I reviewed patient medications for drug interactions. Patient is taking Rapaflo  daily which could increase with Epclusa ; patient aware to monitor symptoms and reach out to provider with any concerns. Also takes Tums as needed for acid reflux; aware to separate these from Epclusa  by at least 4 hours. Instructed patient to call clinic if he wishes to start a new medication during course of therapy. Also advised patient to call if he experiences any side effects. Patient will follow-up in the pharmacy clinic on 02/27/24.  Alan Geralds, PharmD, CPP, BCIDP, AAHIVP Clinical Pharmacist Practitioner Infectious Diseases Clinical Pharmacist [facility]

## 2024-01-25 NOTE — Progress Notes (Signed)
 Specialty Pharmacy Initial Fill Coordination Note  Nathaniel Kline is a 73 y.o. male contacted today regarding initial fill of specialty medication(s) Sofosbuvir -Velpatasvir  (Epclusa )   Patient requested Delivery   Delivery date: 01/29/24   Verified address: [address]  [place] [address]   Medication will be filled on 01/28/24.   Patient is aware of $12.15 copayment.

## 2024-01-28 ENCOUNTER — Other Ambulatory Visit: Payer: Self-pay

## 2024-01-28 ENCOUNTER — Other Ambulatory Visit (HOSPITAL_COMMUNITY): Payer: Self-pay

## 2024-01-29 ENCOUNTER — Other Ambulatory Visit (HOSPITAL_COMMUNITY): Payer: Self-pay

## 2024-02-02 LAB — COLOGUARD: COLOGUARD: NEGATIVE

## 2024-02-04 ENCOUNTER — Ambulatory Visit: Payer: Self-pay | Admitting: Internal Medicine

## 2024-02-05 NOTE — Progress Notes (Signed)
 Nic'd the repeat cologuard

## 2024-02-18 ENCOUNTER — Other Ambulatory Visit (HOSPITAL_COMMUNITY): Payer: Self-pay

## 2024-02-20 ENCOUNTER — Other Ambulatory Visit: Payer: Self-pay

## 2024-02-22 ENCOUNTER — Other Ambulatory Visit: Payer: Self-pay

## 2024-02-25 ENCOUNTER — Other Ambulatory Visit: Payer: Self-pay

## 2024-02-25 ENCOUNTER — Other Ambulatory Visit (HOSPITAL_COMMUNITY): Payer: Self-pay

## 2024-02-25 NOTE — Progress Notes (Signed)
 Specialty Pharmacy Refill Coordination Note  Nathaniel Kline is a 74 y.o. male contacted today regarding refills of specialty medication(s) Sofosbuvir -Velpatasvir  (Epclusa )   Patient requested Delivery   Delivery date: 02/27/24   Verified address: [address] [place] [address]   Medication will be filled on: 02/26/24

## 2024-02-26 ENCOUNTER — Other Ambulatory Visit: Payer: Self-pay

## 2024-02-27 ENCOUNTER — Ambulatory Visit: Admitting: Pharmacist

## 2024-03-18 ENCOUNTER — Other Ambulatory Visit: Payer: Self-pay

## 2024-03-19 ENCOUNTER — Other Ambulatory Visit: Payer: Self-pay

## 2024-03-19 ENCOUNTER — Other Ambulatory Visit (HOSPITAL_COMMUNITY): Payer: Self-pay

## 2024-03-19 NOTE — Progress Notes (Signed)
 Specialty Pharmacy Refill Coordination Note  Spoke with Alize Acy is a 74 y.o. male contacted today regarding refills of specialty medication(s) Sofosbuvir -Velpatasvir  (Epclusa )  Doses on hand: 16  Patient requested: Delivery   Delivery date: 03/28/24   Verified address: [address] [place] [address]  Medication will be filled on 03/27/24  Patient is ok with $12.65 copayment

## 2024-03-27 ENCOUNTER — Other Ambulatory Visit: Payer: Self-pay

## 2024-05-20 ENCOUNTER — Other Ambulatory Visit

## 2024-05-28 ENCOUNTER — Ambulatory Visit: Admitting: Urology
